# Patient Record
Sex: Male | Born: 1980 | Race: Black or African American | Hispanic: No | Marital: Single | Smoking: Former smoker
Health system: Southern US, Community
[De-identification: ages and names within clinical notes are randomized; demographics above are authoritative.]

## PROBLEM LIST (undated history)

## (undated) DIAGNOSIS — G4733 Obstructive sleep apnea (adult) (pediatric): Secondary | ICD-10-CM

## (undated) DIAGNOSIS — Z789 Other specified health status: Secondary | ICD-10-CM

## (undated) DIAGNOSIS — G709 Myoneural disorder, unspecified: Secondary | ICD-10-CM

## (undated) DIAGNOSIS — I5021 Acute systolic (congestive) heart failure: Secondary | ICD-10-CM

## (undated) DIAGNOSIS — I739 Peripheral vascular disease, unspecified: Secondary | ICD-10-CM

## (undated) DIAGNOSIS — Z9989 Dependence on other enabling machines and devices: Secondary | ICD-10-CM

## (undated) DIAGNOSIS — I4892 Unspecified atrial flutter: Secondary | ICD-10-CM

## (undated) DIAGNOSIS — F32A Depression, unspecified: Secondary | ICD-10-CM

## (undated) DIAGNOSIS — I4819 Other persistent atrial fibrillation: Secondary | ICD-10-CM

## (undated) DIAGNOSIS — B351 Tinea unguium: Secondary | ICD-10-CM

## (undated) DIAGNOSIS — K219 Gastro-esophageal reflux disease without esophagitis: Secondary | ICD-10-CM

## (undated) DIAGNOSIS — G4730 Sleep apnea, unspecified: Secondary | ICD-10-CM

## (undated) HISTORY — DX: Unspecified atrial flutter: I48.92

## (undated) HISTORY — DX: Other persistent atrial fibrillation: I48.19

## (undated) HISTORY — PX: CARDIAC CATHETERIZATION: SHX172

## (undated) HISTORY — DX: Obstructive sleep apnea (adult) (pediatric): G47.33

## (undated) HISTORY — DX: Tinea unguium: B35.1

## (undated) HISTORY — DX: Depression, unspecified: F32.A

## (undated) HISTORY — DX: Dependence on other enabling machines and devices: Z99.89

## (undated) HISTORY — PX: MANDIBLE SURGERY: SHX707

## (undated) HISTORY — DX: Gastro-esophageal reflux disease without esophagitis: K21.9

## (undated) HISTORY — DX: Sleep apnea, unspecified: G47.30

---

## 2000-05-06 ENCOUNTER — Emergency Department (HOSPITAL_COMMUNITY): Admission: EM | Admit: 2000-05-06 | Discharge: 2000-05-06 | Payer: Self-pay | Admitting: Emergency Medicine

## 2001-06-08 DIAGNOSIS — I4819 Other persistent atrial fibrillation: Secondary | ICD-10-CM

## 2001-06-08 HISTORY — DX: Other persistent atrial fibrillation: I48.19

## 2003-05-17 ENCOUNTER — Ambulatory Visit (HOSPITAL_BASED_OUTPATIENT_CLINIC_OR_DEPARTMENT_OTHER): Admission: RE | Admit: 2003-05-17 | Discharge: 2003-05-17 | Payer: Self-pay | Admitting: Pulmonary Disease

## 2003-07-05 ENCOUNTER — Ambulatory Visit (HOSPITAL_BASED_OUTPATIENT_CLINIC_OR_DEPARTMENT_OTHER): Admission: RE | Admit: 2003-07-05 | Discharge: 2003-07-05 | Payer: Self-pay | Admitting: Pulmonary Disease

## 2008-10-30 ENCOUNTER — Telehealth (INDEPENDENT_AMBULATORY_CARE_PROVIDER_SITE_OTHER): Payer: Self-pay | Admitting: *Deleted

## 2008-11-09 ENCOUNTER — Ambulatory Visit: Payer: Self-pay | Admitting: Internal Medicine

## 2008-11-09 DIAGNOSIS — G4730 Sleep apnea, unspecified: Secondary | ICD-10-CM | POA: Insufficient documentation

## 2008-11-09 DIAGNOSIS — I4891 Unspecified atrial fibrillation: Secondary | ICD-10-CM | POA: Insufficient documentation

## 2008-11-19 ENCOUNTER — Telehealth: Payer: Self-pay | Admitting: Internal Medicine

## 2008-11-20 ENCOUNTER — Encounter: Payer: Self-pay | Admitting: Internal Medicine

## 2009-08-27 ENCOUNTER — Telehealth: Payer: Self-pay | Admitting: Internal Medicine

## 2009-10-14 ENCOUNTER — Ambulatory Visit: Payer: Self-pay | Admitting: Internal Medicine

## 2009-10-14 DIAGNOSIS — L03221 Cellulitis of neck: Secondary | ICD-10-CM

## 2009-10-14 DIAGNOSIS — L0211 Cutaneous abscess of neck: Secondary | ICD-10-CM | POA: Insufficient documentation

## 2009-11-11 ENCOUNTER — Ambulatory Visit: Payer: Self-pay | Admitting: Internal Medicine

## 2009-11-11 DIAGNOSIS — M25579 Pain in unspecified ankle and joints of unspecified foot: Secondary | ICD-10-CM | POA: Insufficient documentation

## 2009-11-11 DIAGNOSIS — I872 Venous insufficiency (chronic) (peripheral): Secondary | ICD-10-CM | POA: Insufficient documentation

## 2009-11-11 LAB — CONVERTED CEMR LAB
ALT: 34 units/L (ref 0–53)
AST: 29 units/L (ref 0–37)
Albumin: 4.5 g/dL (ref 3.5–5.2)
Alkaline Phosphatase: 46 units/L (ref 39–117)
BUN: 17 mg/dL (ref 6–23)
Basophils Absolute: 0 10*3/uL (ref 0.0–0.1)
Basophils Relative: 0.3 % (ref 0.0–3.0)
Bilirubin Urine: NEGATIVE
Bilirubin, Direct: 0.1 mg/dL (ref 0.0–0.3)
CO2: 28 meq/L (ref 19–32)
Calcium: 9.4 mg/dL (ref 8.4–10.5)
Chloride: 104 meq/L (ref 96–112)
Cholesterol: 169 mg/dL (ref 0–200)
Creatinine, Ser: 1 mg/dL (ref 0.4–1.5)
Eosinophils Absolute: 0.1 10*3/uL (ref 0.0–0.7)
Eosinophils Relative: 1.8 % (ref 0.0–5.0)
GFR calc non Af Amer: 111.17 mL/min (ref >60)
Glucose, Bld: 85 mg/dL (ref 70–99)
HCT: 41.7 % (ref 39.0–52.0)
HDL: 54.4 mg/dL (ref >39.00)
Hemoglobin, Urine: NEGATIVE
Hemoglobin: 14.7 g/dL (ref 13.0–17.0)
Ketones, ur: NEGATIVE mg/dL
LDL Cholesterol: 99 mg/dL (ref 0–99)
Leukocytes, UA: NEGATIVE
Lymphocytes Relative: 24.8 % (ref 12.0–46.0)
Lymphs Abs: 1.6 10*3/uL (ref 0.7–4.0)
MCHC: 35.2 g/dL (ref 30.0–36.0)
MCV: 91.7 fL (ref 78.0–100.0)
Monocytes Absolute: 0.6 10*3/uL (ref 0.1–1.0)
Monocytes Relative: 8.9 % (ref 3.0–12.0)
Neutro Abs: 4.2 10*3/uL (ref 1.4–7.7)
Neutrophils Relative %: 64.2 % (ref 43.0–77.0)
Nitrite: NEGATIVE
Platelets: 205 10*3/uL (ref 150.0–400.0)
Potassium: 4.1 meq/L (ref 3.5–5.1)
RBC: 4.55 M/uL (ref 4.22–5.81)
RDW: 13.7 % (ref 11.5–14.6)
Sodium: 142 meq/L (ref 135–145)
Specific Gravity, Urine: >=1.03 (ref 1.000–1.030)
TSH: 1.34 microintl units/mL (ref 0.35–5.50)
Total Bilirubin: 0.8 mg/dL (ref 0.3–1.2)
Total CHOL/HDL Ratio: 3
Total Protein, Urine: NEGATIVE mg/dL
Total Protein: 7.7 g/dL (ref 6.0–8.3)
Triglycerides: 80 mg/dL (ref 0.0–149.0)
Urine Glucose: NEGATIVE mg/dL
Urobilinogen, UA: 0.2 (ref 0.0–1.0)
VLDL: 16 mg/dL (ref 0.0–40.0)
Vitamin B-12: 414 pg/mL (ref 211–911)
WBC: 6.6 10*3/uL (ref 4.5–10.5)
pH: 5 (ref 5.0–8.0)

## 2009-11-12 LAB — CONVERTED CEMR LAB

## 2010-04-11 ENCOUNTER — Encounter: Payer: Self-pay | Admitting: Internal Medicine

## 2010-04-11 ENCOUNTER — Ambulatory Visit: Payer: Self-pay | Admitting: Internal Medicine

## 2010-04-11 DIAGNOSIS — R05 Cough: Secondary | ICD-10-CM

## 2010-04-11 DIAGNOSIS — R091 Pleurisy: Secondary | ICD-10-CM | POA: Insufficient documentation

## 2010-04-11 DIAGNOSIS — R059 Cough, unspecified: Secondary | ICD-10-CM | POA: Insufficient documentation

## 2010-07-06 LAB — CONVERTED CEMR LAB
ALT: 38 units/L (ref 0–53)
AST: 29 units/L (ref 0–37)
Albumin: 4.3 g/dL (ref 3.5–5.2)
Alkaline Phosphatase: 55 units/L (ref 39–117)
BUN: 15 mg/dL (ref 6–23)
Basophils Absolute: 0.1 10*3/uL (ref 0.0–0.1)
Basophils Relative: 1 % (ref 0.0–3.0)
Bilirubin Urine: NEGATIVE
Bilirubin, Direct: 0.1 mg/dL (ref 0.0–0.3)
CO2: 27 meq/L (ref 19–32)
Calcium: 9.4 mg/dL (ref 8.4–10.5)
Chloride: 109 meq/L (ref 96–112)
Cholesterol: 157 mg/dL (ref 0–200)
Creatinine, Ser: 0.9 mg/dL (ref 0.4–1.5)
Eosinophils Absolute: 0.1 10*3/uL (ref 0.0–0.7)
Eosinophils Relative: 1.9 % (ref 0.0–5.0)
GFR calc non Af Amer: 127.86 mL/min (ref >60)
Glucose, Bld: 101 mg/dL — ABNORMAL HIGH (ref 70–99)
HCT: 45.4 % (ref 39.0–52.0)
HDL: 38.1 mg/dL — ABNORMAL LOW (ref >39.00)
Hemoglobin, Urine: NEGATIVE
Hemoglobin: 15.9 g/dL (ref 13.0–17.0)
Ketones, ur: NEGATIVE mg/dL
LDL Cholesterol: 104 mg/dL — ABNORMAL HIGH (ref 0–99)
Leukocytes, UA: NEGATIVE
Lymphocytes Relative: 26.7 % (ref 12.0–46.0)
Lymphs Abs: 1.8 10*3/uL (ref 0.7–4.0)
MCHC: 35.1 g/dL (ref 30.0–36.0)
MCV: 91 fL (ref 78.0–100.0)
Monocytes Absolute: 0.7 10*3/uL (ref 0.1–1.0)
Monocytes Relative: 10.5 % (ref 3.0–12.0)
Neutro Abs: 4 10*3/uL (ref 1.4–7.7)
Neutrophils Relative %: 59.9 % (ref 43.0–77.0)
Nitrite: NEGATIVE
Platelets: 184 10*3/uL (ref 150.0–400.0)
Potassium: 4.1 meq/L (ref 3.5–5.1)
RBC: 4.99 M/uL (ref 4.22–5.81)
RDW: 12.8 % (ref 11.5–14.6)
Sodium: 140 meq/L (ref 135–145)
Specific Gravity, Urine: 1.025 (ref 1.000–1.030)
TSH: 0.82 microintl units/mL (ref 0.35–5.50)
Total Bilirubin: 0.7 mg/dL (ref 0.3–1.2)
Total CHOL/HDL Ratio: 4
Total Protein, Urine: NEGATIVE mg/dL
Total Protein: 7.6 g/dL (ref 6.0–8.3)
Triglycerides: 74 mg/dL (ref 0.0–149.0)
Urine Glucose: NEGATIVE mg/dL
Urobilinogen, UA: 0.2 (ref 0.0–1.0)
VLDL: 14.8 mg/dL (ref 0.0–40.0)
WBC: 6.7 10*3/uL (ref 4.5–10.5)
pH: 5.5 (ref 5.0–8.0)

## 2010-07-10 NOTE — Assessment & Plan Note (Signed)
Summary: PHYSICAL---STC   Vital Signs:  Patient profile:   30 year old male Height:      74 inches Weight:      270.50 pounds BMI:     34.86 O2 Sat:      98 % on Room air Temp:     97.0 degrees F oral Pulse rate:   88 / minute BP sitting:   124 / 82  (left arm) Cuff size:   large  Vitals Entered By: Lucious Groves (November 11, 2009 3:17 PM)  O2 Flow:  Room air CC: CPX./kb Is Patient Diabetic? No Pain Assessment Patient in pain? no      Comments Patient deneis taking Vitamin D and Aspirin. Also, needs C PAP prescription re-printed./kb   CC:  CPX./kb.  History of Present Illness: The patient presents for a wellness examination  C/o R ankle  swelling C/o R big toenail discolored  Current Medications (verified): 1)  Vitamin D3 1000 Unit  Tabs (Cholecalciferol) .Marland Kitchen.. 1 By Mouth Daily 2)  Aspirin 81 Mg  Tbec (Aspirin) .... One By Mouth Every Day 3)  Cpap Mask .... 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    Physical  Exam  General:  Well-developed,well-nourished,in no acute distress; alert,appropriate and cooperative throughout examination,overweight-appearing.   Head:  Normocephalic and atraumatic without obvious abnormalities. No apparent alopecia or balding. Eyes:  No corneal or conjunctival inflammation noted. EOMI. Perrla. Ears:  External ear exam shows no significant lesions or deformities.  Otoscopic examination reveals clear canals, tympanic membranes are intact bilaterally without bulging, retraction, inflammation or discharge. Hearing is grossly normal bilaterally. Nose:  External nasal examination shows no deformity or inflammation. Nasal mucosa are pink and moist without lesions or exudates. Mouth:  Oral mucosa and oropharynx without lesions or exudates.  Teeth in good repair. Neck:  No deformities, masses, or tenderness noted. Chest Wall:  No deformities, masses, tenderness or gynecomastia noted. Breasts:  Mild  B gynecomastia.   Lungs:  Normal respiratory effort, chest expands symmetrically. Lungs are clear to auscultation, no crackles or wheezes. Heart:  Normal rate and regular rhythm. S1 and S2 normal without gallop, murmur, click, rub or other extra sounds. Abdomen:  Bowel sounds positive,abdomen soft and non-tender without masses, organomegaly or hernias noted. Genitalia:  Nl self exam Msk:  No deformity or scoliosis noted of thoracic or lumbar spine.   Pulses:  R and L carotid,radial,femoral,dorsalis pedis and posterior tibial pulses are full and equal bilaterally Extremities:  R ankle with slight swelling and a number of spider veins Neurologic:  No cranial nerve deficits noted. Station and gait are normal. Plantar reflexes are down-going bilaterally. DTRs are symmetrical throughout. Sensory, motor and coordinative functions appear intact. Skin:  hyperpigmented  R ankle R  great toe w/yellow discol.  Cervical Nodes:  No lymphadenopathy noted Psych:  Cognition and judgment appear intact.  Alert and cooperative with normal attention span and concentration. No apparent delusions, illusions, hallucinations   Impression & Recommendations:  Problem # 1:  PHYSICAL EXAMINATION (ICD-V70.0) Assessment New Health and age related issues were discussed. Available screening tests and vaccinations were discussed as well. Healthy life style including good diet and execise was discussed.  The labs were reviewed with the patient.  He needs to reduce his alcoho lintake Orders: TLB-B12, Serum-Total ONLY ([address]-U04) TLB-BMP (Basic Metabolic Panel-BMET) (80048-METABOL) TLB-CBC Platelet - w/Differential (85025-CBCD) TLB-Hepatic/Liver Function Pnl (80076-HEPATIC) TLB-Lipid Panel (80061-LIPID) TLB-TSH (Thyroid Stimulating Hormone) (84443-TSH) TLB-Udip ONLY (81003-UDIP) T-HIV Antibody  (Reflex) (540[address]-11914)  Problem # 2:  ANKLE PAIN (ICD-719.47) R - ven insuff Assessment: Deteriorated Compr socks Vein clinic cons. suggested Orders: TLB-BMP (Basic Metabolic Panel-BMET) (80048-METABOL) TLB-CBC Platelet - w/Differential (85025-CBCD) TLB-Hepatic/Liver Function Pnl (80076-HEPATIC) TLB-Lipid Panel (80061-LIPID) TLB-TSH (Thyroid Stimulating Hormone) (84443-TSH) TLB-Udip ONLY (81003-UDIP)  Problem # 3:  ATRIAL FIBRILLATION (ICD-427.31) Assessment: Unchanged  His updated medication list for this problem includes:    Aspirin 81 Mg Tbec (Aspirin) ..... One by mouth every day  Problem # 4:  ABSCESS, NECK (ICD-682.1) Assessment: Improved  Problem # 5:  Discolored oenail R big toe Penlac did not help - Derm cons  Complete Medication List: 1)  Vitamin D3 1000 Unit Tabs (Cholecalciferol) .Marland Kitchen.. 1 by mouth daily 2)  Aspirin 81 Mg Tbec (Aspirin) .... One by mouth every day 3)  Cpap Mask  .... As dirrected  Patient Instructions: 1)  Please schedule a follow-up appointment in 6 months. 2)  Compression socks 3)  Loose weight! 4)  Try to eat more raw plant food, fresh and dry fruit, raw almonds,  leafy vegetables, whole foods and less red meat, less animal fat. Poultry and fish is better for you than pork and beef. Avoid processed foods (canned soups, hot dogs, sausage, bacon , frozen dinners). Avoid corn syrup, high fructose syrup.  Honey, Agave and Stevia are better sweeteners. Make your own  dressing with olive oil, wine vinegar, lemon juce, garlic etc. for your salads. Prescriptions: CPAP MASK as dirrected  #1 x 0   Entered and Authorized by:   Tresa Garter MD   Signed by:   Tresa Garter MD on 11/11/2009   Method used:   Print then Give to Patient   RxID:   [id number]

## 2010-07-10 NOTE — Letter (Signed)
Summary: Out of Work  [facility]  [address] [place], [place] [address]   Phone: [phone]  Fax: [phone]    Oct 14, 2009   Employee:  Lance [name]    To Whom It May Concern:   For Medical reasons, please excuse the above named employee from work for the following dates:  Start:   10/14/09  End: 10/16/09    If you need additional information, please feel free to contact our office.         Sincerely,    Tresa Garter MD

## 2010-07-10 NOTE — Progress Notes (Signed)
Summary: Mask  Phone Note Call from Patient Call back at Home Phone [phone]   Summary of Call: Pt had sleep study in 2003 by Dr Shelle Iron and was prescribed a "sleep machine". He is req a replacement rx for mask.  Initial call taken by: Lamar Sprinkles, CMA,  August 27, 2009 11:42 AM  Follow-up for Phone Call        ok Follow-up by: Tresa Garter MD,  August 27, 2009 6:02 PM  Additional Follow-up for Phone Call Additional follow up Details #1::        Patient notified and will have the company contact us  with how to take care of the prescription. Additional Follow-up by: Lucious Groves,  August 28, 2009 9:38 AM    New/Updated Medications: * CPAP MASK as dirrected Prescriptions: CPAP MASK as dirrected  #1 x 0   Entered and Authorized by:   Tresa Garter MD   Signed by:   Lucious Groves on 08/28/2009   Method used:   Print then Give to Patient   RxID:   [id number]

## 2010-07-10 NOTE — Assessment & Plan Note (Signed)
Summary: chest pain when coughing/plotnikov-lb   Vital Signs:  Patient profile:   30 year old male Height:      74 inches (187.96 cm) Weight:      270 pounds (122.73 kg) O2 Sat:      96 % on Room air Temp:     97.2 degrees F (36.22 degrees C) oral Pulse rate:   128 / minute BP sitting:   122 / 72  (left arm) Cuff size:   large  Vitals Entered By: Orlan Leavens RMA (April 11, 2010 1:39 PM)  O2 Flow:  Room air CC: Cough Is Patient Diabetic? No Pain Assessment Patient in pain? yes     Location: chest Type: aching Onset of pain  Pt states been having cough with little phlegm, been haviong some SOB & chest pain x's 3 days   Primary Care Provider:  Tresa Garter MD  CC:  Cough.  History of Present Illness: c/o dry cough onset 3 days ago feels tight in chest and painful resp with every cough spell - today a/w DOE and SOB, no leg swelling no sputum, no fever - +racing heart "but different than my A fib" +sick contacts no n/v/d no dizzy or syncope symptoms not improved with OTC meds (has not tried NSAIDs and denies decongestant use)  Current Medications (verified): 1)  Vitamin D3 1000 Unit  Tabs (Cholecalciferol) .Marland Kitchen.. 1 By Mouth Daily 2)  Aspirin 81 Mg  Tbec (Aspirin) .... One By Mouth Every Day 3)  Cpap Mask .... 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    Physical Exam  General:  alert, well-developed, well-nourished, and cooperative to examination.   nontoxic Lungs:  Normal respiratory effort, chest expands  symmetrically. Lungs are clear to auscultation, no crackles or wheezes. Heart:  tachy, irreg irreg, no rub and no murmur appreciated   Impression & Recommendations:  Problem # 1:  PLEURISY (ICD-511.0) suspect viral syndrome causing dry cough and painful resp symptoms  check cxr now r/o other abn - hold emperic abx but tx NSAIDs and cough suppress with hydromet flare of Afib, currently RVR but long hx same - likely exac but acute infx syndrome - no ischemic change on EKG Orders: T-2 View CXR (71020TC) Prescription Created Electronically [phone])  Problem # 2:  COUGH (ICD-786.2) see pleurisy discussion above re: med tx same Orders: T-2 View CXR (71020TC)  Problem # 3:  ATRIAL FIBRILLATION (ICD-427.31) HD stable flare of Afib, currently RVR but long hx same - likely exac but acute infx syndrome - no ischemic change on EKG today rec cards followup, esp if unresolved with tx of acute infx/cough His updated medication list for this problem includes:    Aspirin 81 Mg Tbec (Aspirin) ..... One by mouth every day  Orders: EKG w/ Interpretation (93000)  Complete Medication List: 1)  Vitamin D3 1000 Unit Tabs (Cholecalciferol) .Marland Kitchen.. 1 by mouth daily 2)  Aspirin 81 Mg Tbec (Aspirin) .... One by mouth every day 3)  Cpap Mask  .... As dirrected 4)  Indomethacin 50 Mg Caps (Indomethacin) .Marland Kitchen.. 1 by mouth  three times a day x 5 days 5)  Hydromet 5-1.5 Mg/44ml Syrp (Hydrocodone-homatropine) .... 5 cc by mouth every 4-6 hours as needed for cough symptoms  Patient Instructions: 1)  it was good to see you today. 2)  hydromet syrup as needed for cough and indomethacin three times a day x 5days for pain and inflammation - your prescriptions have been electronically submitted to your pharmacy. Please take as directed. Contact our office if you believe you're having problems with the medication(s).  3)  xray ordered today - your results will be called to you after review in 48-72 hours from the time of  test completion 4)  also get plenty of rest, drink lots of clear liquids, and use Tylenol if needed for fever and comfort. Return in 7-10 days if you're not better:sooner if you're feeling worse. 5)  schedule followup with Dr. [name] if continued heart racing symptoms after resolution of your cough and cold symptoms  Prescriptions: HYDROMET 5-1.5 MG/5ML SYRP (HYDROCODONE-HOMATROPINE) 5 cc by mouth every 4-6 hours as needed for cough symptoms  #6 oz x 0   Entered and Authorized by:   Newt Lukes MD   Signed by:   Newt Lukes MD on 04/11/2010   Method used:   Printed then faxed to ...       [facility]       [address]       [place], [place]  [address]       Ph: [phone]       Fax: [phone]   RxID:   [id number] INDOMETHACIN 50 MG CAPS (INDOMETHACIN) 1 by mouth three times a day x 5 days  #5 x 0   Entered and Authorized by:   Newt Lukes MD   Signed by:   Newt Lukes MD on 04/11/2010   Method used:   Electronically to        [facility])       [address]       [place], [place]  [address]       Ph: [phone]       Fax: [phone]   RxID:   [id number]    Orders Added: 1)  EKG w/ Interpretation [93000] 2)  T-2 View CXR [71020TC] 3)  Est. Patient Level IV [88416] 4)  Prescription Created Electronically [phone]

## 2010-07-10 NOTE — Assessment & Plan Note (Signed)
Summary: swollen knot on neck/cd   Vital Signs:  Patient profile:   30 year old male Height:      74 inches Weight:      274.50 pounds BMI:     35.37 O2 Sat:      97 % on Room air Temp:     98.0 degrees F oral Pulse rate:   81 / minute BP sitting:   114 / 78  (left arm) Cuff size:   large  Vitals Entered By: Lucious Groves (Oct 14, 2009 11:20 AM)  O2 Flow:  Room air  Procedure Note  Incision & Drainage: The patient complains of pain, redness, and inflammation. Indication: infected lesion Consent signed: yes  Procedure # 1: I & D with packing    Size (in cm): 2.5 x 2.0    Region: posterior    Location: neck-anterior    Comment: Risks including but not limited by incomplete procedure, bleeding, infection, recurrence were discussed with the patient. Consent form was signed.     Instrument used: #10 blade    Anesthesia: 1% lidocaine w/epinephrine  Cleaned and prepped with: alcohol and betadine Wound dressing: bulky gauze dressing Instructions: daily dressing changes Additional Instructions: Silvadine cream; 4" packing  CC: C/o swollen knot on back of neck at hairline./kb Is Patient Diabetic? No Pain Assessment Patient in pain? yes     Location: back of neck Intensity: 6 Onset of pain  1 week ago Comments Patient notes that there has been a bump there, but has become a knot in the past week. Patient denies haircut/shave within appropriate time frame./kb   CC:  C/o swollen knot on back of neck at hairline./kb.  History of Present Illness: C/o painful lump on the back of his neck x 3-4 d: much worse over past 24 hrs. He used to have a small bean size subcutaneouse lump there x years  Current Medications (verified): 1)  Vitamin D3 1000 Unit  Tabs (Cholecalciferol) .Marland Kitchen.. 1 By Mouth Daily 2)  Penlac 8 % Soln (Ciclopirox) .... Qd 3)  Aspirin 81 Mg  Tbec (Aspirin) .... One By Mouth Every Day 4)  Cpap Mask .... 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   Head:  Normocephalic and atraumatic without obvious abnormalities. No apparent alopecia or balding. Neck:  Tender posteriorly Lungs:  Normal respiratory effort, chest expands symmetrically. Lungs are clear to auscultation, no crackles or wheezes. Heart:  Normal rate and regular rhythm. S1 and S2 normal without gallop, murmur, click, rub or other extra sounds. Skin:  2.5x2.0 cm erythematouse tender abscess in the mid-posterior neck   Impression & Recommendations:  Problem # 1:  ABSCESS, NECK (ICD-682.1) Assessment New  Options discussed - elected I&D His updated medication list for this problem includes:    Doxycycline Hyclate 100 Mg Caps (Doxycycline hyclate) .Marland Kitchen... 1 by mouth two times a day with a glass of water Mupirocin oint  Orders: I&D Abscess, Complex (10061)  Complete Medication List: 1)  Vitamin D3 1000 Unit Tabs (Cholecalciferol) .Marland Kitchen.. 1 by mouth daily 2)  Penlac 8 % Soln (Ciclopirox) .... Qd 3)  Aspirin 81 Mg Tbec (Aspirin) .... One by mouth every day  4)  Cpap Mask  .... As dirrected 5)  Mupirocin 2 % Oint (Mupirocin) .... Use two times a day w/dressing change 6)  Doxycycline Hyclate 100 Mg Caps (Doxycycline hyclate) .Marland Kitchen.. 1 by mouth two times a day with a glass of water 7)  Hydrocodone-acetaminophen 5-325 Mg Tabs (Hydrocodone-acetaminophen) .Marland Kitchen.. 1-2 by mouth two times a day as needed pain  Patient Instructions: 1)  Pull out one inch of the drain/day and cut off 2)  Change dressing daily 3)  Call if you are not better in a reasonable  amount of time or if worse. Go to ER if feeling really bad! Prescriptions: HYDROCODONE-ACETAMINOPHEN 5-325 MG TABS (HYDROCODONE-ACETAMINOPHEN) 1-2 by mouth two times a day as needed pain  #20 x 0   Entered and Authorized by:   Tresa Garter MD   Signed by:   Tresa Garter MD on 10/14/2009   Method used:   Print then Give to Patient   RxID:   [id number] DOXYCYCLINE HYCLATE 100 MG CAPS (DOXYCYCLINE HYCLATE) 1 by mouth two times a day with a glass of water  #28 x 1   Entered and Authorized by:   Tresa Garter MD   Signed by:   Tresa Garter MD on 10/14/2009   Method used:   Print then Give to Patient   RxID:   [id number] MUPIROCIN 2 % OINT (MUPIROCIN) use two times a day w/dressing change  #30 g x 0   Entered and Authorized by:   Tresa Garter MD   Signed by:   Tresa Garter MD on 10/14/2009   Method used:   Print then Give to Patient   RxID:   [id number]

## 2010-11-07 ENCOUNTER — Other Ambulatory Visit: Payer: Self-pay | Admitting: Internal Medicine

## 2010-11-07 ENCOUNTER — Other Ambulatory Visit (INDEPENDENT_AMBULATORY_CARE_PROVIDER_SITE_OTHER): Payer: Self-pay

## 2010-11-07 DIAGNOSIS — Z0000 Encounter for general adult medical examination without abnormal findings: Secondary | ICD-10-CM

## 2010-11-07 LAB — HEPATIC FUNCTION PANEL
ALT: 35 U/L (ref 0–53)
Total Bilirubin: 0.8 mg/dL (ref 0.3–1.2)
Total Protein: 7.1 g/dL (ref 6.0–8.3)

## 2010-11-07 LAB — LIPID PANEL
Cholesterol: 128 mg/dL (ref 0–200)
LDL Cholesterol: 82 mg/dL (ref 0–99)
Total CHOL/HDL Ratio: 3
VLDL: 9 mg/dL (ref 0.0–40.0)

## 2010-11-07 LAB — CBC WITH DIFFERENTIAL/PLATELET
Eosinophils Relative: 2.3 % (ref 0.0–5.0)
Monocytes Relative: 10.2 % (ref 3.0–12.0)
Neutrophils Relative %: 58.5 % (ref 43.0–77.0)
Platelets: 218 10*3/uL (ref 150.0–400.0)
WBC: 5.7 10*3/uL (ref 4.5–10.5)

## 2010-11-07 LAB — BASIC METABOLIC PANEL
BUN: 14 mg/dL (ref 6–23)
CO2: 25 mEq/L (ref 19–32)
Chloride: 106 mEq/L (ref 96–112)
Potassium: 4.6 mEq/L (ref 3.5–5.1)

## 2010-11-07 LAB — URINALYSIS
Hgb urine dipstick: NEGATIVE
Ketones, ur: NEGATIVE
Urine Glucose: NEGATIVE
Urobilinogen, UA: 0.2 (ref 0.0–1.0)

## 2010-11-07 LAB — TSH: TSH: 0.84 u[IU]/mL (ref 0.35–5.50)

## 2010-11-14 ENCOUNTER — Encounter: Payer: Self-pay | Admitting: Internal Medicine

## 2010-11-14 ENCOUNTER — Ambulatory Visit (INDEPENDENT_AMBULATORY_CARE_PROVIDER_SITE_OTHER): Payer: 59 | Admitting: Internal Medicine

## 2010-11-14 VITALS — BP 118/70 | HR 72 | Temp 97.7°F | Resp 16 | Ht 74.0 in | Wt 259.0 lb

## 2010-11-14 DIAGNOSIS — R21 Rash and other nonspecific skin eruption: Secondary | ICD-10-CM

## 2010-11-14 DIAGNOSIS — R05 Cough: Secondary | ICD-10-CM

## 2010-11-14 DIAGNOSIS — R059 Cough, unspecified: Secondary | ICD-10-CM

## 2010-11-14 DIAGNOSIS — I4891 Unspecified atrial fibrillation: Secondary | ICD-10-CM

## 2010-11-14 DIAGNOSIS — Z0000 Encounter for general adult medical examination without abnormal findings: Secondary | ICD-10-CM | POA: Insufficient documentation

## 2010-11-14 DIAGNOSIS — Z23 Encounter for immunization: Secondary | ICD-10-CM

## 2010-11-14 MED ORDER — KETOCONAZOLE 2 % EX CREA: TOPICAL_CREAM | Freq: Every day | CUTANEOUS | Status: AC

## 2010-11-14 MED ORDER — CLOBETASOL PROPIONATE 0.05 % EX OINT: TOPICAL_OINTMENT | Freq: Two times a day (BID) | CUTANEOUS | Status: AC

## 2010-11-14 MED ORDER — TETANUS-DIPHTH-ACELL PERTUSSIS 5-2.5-18.5 LF-MCG/0.5 IM SUSP
0.5000 mL | Freq: Once | INTRAMUSCULAR | Status: AC
  Administered 2010-11-14: 0.5 mL via INTRAMUSCULAR

## 2010-11-14 NOTE — Assessment & Plan Note (Signed)
Better  

## 2010-11-14 NOTE — Assessment & Plan Note (Signed)
Eczema poss with a fungal overgrowth. See Meds Dr Jorja Loa if not better
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
## 2010-11-14 NOTE — Assessment & Plan Note (Signed)
We discussed age appropriate health related issues, including available/recomended screening tests and vaccinations. We discussed a need for adhering to healthy diet and exercise. Labs/EKG were reviewed/ordered. All questions were answered.   

## 2010-11-19 ENCOUNTER — Ambulatory Visit (INDEPENDENT_AMBULATORY_CARE_PROVIDER_SITE_OTHER)
Admission: RE | Admit: 2010-11-19 | Discharge: 2010-11-19 | Disposition: A | Discharge status: Home / Self Care | Payer: 59 | Source: Ambulatory Visit | Attending: Internal Medicine | Admitting: Internal Medicine

## 2010-11-19 DIAGNOSIS — R05 Cough: Secondary | ICD-10-CM

## 2010-11-19 DIAGNOSIS — Z0000 Encounter for general adult medical examination without abnormal findings: Secondary | ICD-10-CM

## 2010-11-19 DIAGNOSIS — R059 Cough, unspecified: Secondary | ICD-10-CM

## 2010-11-24 ENCOUNTER — Telehealth: Payer: Self-pay | Admitting: *Deleted

## 2010-11-24 MED ORDER — MOMETASONE FURO-FORMOTEROL FUM 100-5 MCG/ACT IN AERO: 2.0000 | INHALATION_SPRAY | RESPIRATORY_TRACT | Status: DC

## 2010-11-24 NOTE — Telephone Encounter (Signed)
Mild bronchitis on CXR - poss asthmatic. Pick up Dulera sample and card - use qd x 1 month - see if cough resolved Thx

## 2010-11-24 NOTE — Telephone Encounter (Signed)
Pt calling to get results of chest xray please Advise

## 2010-11-25 NOTE — Telephone Encounter (Signed)
Pt informed & sample ready  For P/U

## 2010-12-08 ENCOUNTER — Ambulatory Visit (INDEPENDENT_AMBULATORY_CARE_PROVIDER_SITE_OTHER)
Admission: RE | Admit: 2010-12-08 | Discharge: 2010-12-08 | Disposition: A | Discharge status: Home / Self Care | Payer: 59 | Source: Ambulatory Visit | Attending: Internal Medicine | Admitting: Internal Medicine

## 2010-12-08 ENCOUNTER — Encounter: Payer: Self-pay | Admitting: Internal Medicine

## 2010-12-08 ENCOUNTER — Ambulatory Visit (INDEPENDENT_AMBULATORY_CARE_PROVIDER_SITE_OTHER): Payer: 59 | Admitting: Internal Medicine

## 2010-12-08 VITALS — BP 122/82 | HR 74 | Temp 98.0°F | Ht 74.0 in | Wt 256.0 lb

## 2010-12-08 DIAGNOSIS — M25522 Pain in left elbow: Secondary | ICD-10-CM

## 2010-12-08 DIAGNOSIS — M25529 Pain in unspecified elbow: Secondary | ICD-10-CM

## 2010-12-08 MED ORDER — DICLOFENAC SODIUM 75 MG PO TBEC: 75.0000 mg | DELAYED_RELEASE_TABLET | Freq: Two times a day (BID) | ORAL | Status: AC
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
## 2010-12-08 NOTE — Patient Instructions (Signed)
It was good to see you today. You have a nondisplaced, small fracture of radial head (elbow injury) - this does not require surgery Ice to elbow 20 minutes 4x/day for 1st 48 hours, then as needed Use Voltaren 2x/day with food for inflammation and pain - Your prescription(s) have been submitted to your pharmacy. Please take as directed and contact our office if you believe you are having problem(s) with the medication(s). Return for recheck xray in 2-3 weeks to recheck healing, call sooner for recheck office visit if worse Move elbow as much as possible as pin will allow - turn hand over as discussed several times daily and flex+extend at elbow as much as you can toelrate - pain with remain with extension for up to 6 weeks but should slowly improve with time

## 2012-08-13 ENCOUNTER — Emergency Department (HOSPITAL_COMMUNITY): Payer: Self-pay

## 2012-08-13 ENCOUNTER — Inpatient Hospital Stay (HOSPITAL_COMMUNITY)
Admission: EM | Admit: 2012-08-13 | Discharge: 2012-08-22 | DRG: 308 | Disposition: A | Discharge status: Home / Self Care | Payer: 59 | Attending: Internal Medicine | Admitting: Internal Medicine

## 2012-08-13 ENCOUNTER — Encounter (HOSPITAL_COMMUNITY): Payer: Self-pay

## 2012-08-13 DIAGNOSIS — G4730 Sleep apnea, unspecified: Secondary | ICD-10-CM

## 2012-08-13 DIAGNOSIS — I959 Hypotension, unspecified: Secondary | ICD-10-CM

## 2012-08-13 DIAGNOSIS — I5021 Acute systolic (congestive) heart failure: Secondary | ICD-10-CM | POA: Diagnosis present

## 2012-08-13 DIAGNOSIS — I4891 Unspecified atrial fibrillation: Principal | ICD-10-CM

## 2012-08-13 DIAGNOSIS — G4733 Obstructive sleep apnea (adult) (pediatric): Secondary | ICD-10-CM

## 2012-08-13 DIAGNOSIS — R570 Cardiogenic shock: Secondary | ICD-10-CM | POA: Diagnosis present

## 2012-08-13 DIAGNOSIS — R091 Pleurisy: Secondary | ICD-10-CM

## 2012-08-13 DIAGNOSIS — I428 Other cardiomyopathies: Secondary | ICD-10-CM | POA: Diagnosis present

## 2012-08-13 DIAGNOSIS — T462X5A Adverse effect of other antidysrhythmic drugs, initial encounter: Secondary | ICD-10-CM | POA: Diagnosis present

## 2012-08-13 DIAGNOSIS — R0602 Shortness of breath: Secondary | ICD-10-CM

## 2012-08-13 DIAGNOSIS — J9600 Acute respiratory failure, unspecified whether with hypoxia or hypercapnia: Secondary | ICD-10-CM | POA: Diagnosis not present

## 2012-08-13 DIAGNOSIS — Z9989 Dependence on other enabling machines and devices: Secondary | ICD-10-CM

## 2012-08-13 DIAGNOSIS — I10 Essential (primary) hypertension: Secondary | ICD-10-CM | POA: Diagnosis present

## 2012-08-13 DIAGNOSIS — R579 Shock, unspecified: Secondary | ICD-10-CM

## 2012-08-13 DIAGNOSIS — R7989 Other specified abnormal findings of blood chemistry: Secondary | ICD-10-CM

## 2012-08-13 DIAGNOSIS — I4892 Unspecified atrial flutter: Secondary | ICD-10-CM | POA: Diagnosis not present

## 2012-08-13 DIAGNOSIS — I059 Rheumatic mitral valve disease, unspecified: Secondary | ICD-10-CM | POA: Diagnosis present

## 2012-08-13 DIAGNOSIS — R05 Cough: Secondary | ICD-10-CM

## 2012-08-13 DIAGNOSIS — R059 Cough, unspecified: Secondary | ICD-10-CM

## 2012-08-13 LAB — CBC WITH DIFFERENTIAL/PLATELET
Basophils Absolute: 0 10*3/uL (ref 0.0–0.1)
Basophils Relative: 0 % (ref 0–1)
Eosinophils Absolute: 0.1 10*3/uL (ref 0.0–0.7)
Eosinophils Relative: 1 % (ref 0–5)
HCT: 40.1 % (ref 39.0–52.0)
Hemoglobin: 14 g/dL (ref 13.0–17.0)
MCH: 32 pg (ref 26.0–34.0)
MCHC: 34.9 g/dL (ref 30.0–36.0)
MCV: 91.6 fL (ref 78.0–100.0)
Monocytes Absolute: 0.5 10*3/uL (ref 0.1–1.0)
Monocytes Relative: 7 % (ref 3–12)
RDW: 14.3 % (ref 11.5–15.5)

## 2012-08-13 LAB — POCT I-STAT, CHEM 8
Calcium, Ion: 1.24 mmol/L — ABNORMAL HIGH (ref 1.12–1.23)
Chloride: 105 mEq/L (ref 96–112)
HCT: 41 % (ref 39.0–52.0)
Hemoglobin: 13.9 g/dL (ref 13.0–17.0)

## 2012-08-13 LAB — BASIC METABOLIC PANEL
Calcium: 9.1 mg/dL (ref 8.4–10.5)
GFR calc Af Amer: >90 mL/min (ref >90)
GFR calc non Af Amer: 81 mL/min — ABNORMAL LOW (ref >90)
Sodium: 137 mEq/L (ref 135–145)

## 2012-08-13 LAB — PHOSPHORUS: Phosphorus: 3.1 mg/dL (ref 2.3–4.6)

## 2012-08-13 LAB — POCT I-STAT TROPONIN I: Troponin i, poc: 0.03 ng/mL (ref 0.00–0.08)

## 2012-08-13 LAB — MAGNESIUM: Magnesium: 1.8 mg/dL (ref 1.5–2.5)

## 2012-08-13 LAB — PRO B NATRIURETIC PEPTIDE: Pro B Natriuretic peptide (BNP): 2394 pg/mL — ABNORMAL HIGH (ref 0–125)

## 2012-08-13 MED ORDER — LIDOCAINE HCL (PF) 1 % IJ SOLN
INTRAMUSCULAR | Status: AC
  Filled 2012-08-13: qty 5

## 2012-08-13 MED ORDER — SODIUM CHLORIDE 0.9 % IJ SOLN: 3.0000 mL | INTRAMUSCULAR | Status: DC | PRN

## 2012-08-13 MED ORDER — FUROSEMIDE 10 MG/ML IJ SOLN
80.0000 mg | Freq: Two times a day (BID) | INTRAMUSCULAR | Status: DC
  Administered 2012-08-13 – 2012-08-14 (×2): 80 mg via INTRAVENOUS
  Filled 2012-08-13 (×4): qty 8

## 2012-08-13 MED ORDER — HEPARIN (PORCINE) IN NACL 100-0.45 UNIT/ML-% IJ SOLN
1900.0000 [IU]/h | INTRAMUSCULAR | Status: DC
  Administered 2012-08-13 – 2012-08-17 (×7): 1500 [IU]/h via INTRAVENOUS
  Administered 2012-08-18 – 2012-08-19 (×3): 1900 [IU]/h via INTRAVENOUS
  Filled 2012-08-13 (×13): qty 250

## 2012-08-13 MED ORDER — SODIUM CHLORIDE 0.9 % IV SOLN: 250.0000 mL | INTRAVENOUS | Status: DC | PRN

## 2012-08-13 MED ORDER — RIVAROXABAN 20 MG PO TABS
20.0000 mg | ORAL_TABLET | Freq: Every day | ORAL | Status: DC
  Filled 2012-08-13: qty 1

## 2012-08-13 MED ORDER — AMIODARONE IV BOLUS ONLY 150 MG/100ML: 150.0000 mg | Freq: Once | INTRAVENOUS | Status: DC

## 2012-08-13 MED ORDER — SODIUM CHLORIDE 0.9 % IJ SOLN
3.0000 mL | Freq: Two times a day (BID) | INTRAMUSCULAR | Status: DC
  Administered 2012-08-13 – 2012-08-16 (×2): 3 mL via INTRAVENOUS

## 2012-08-13 MED ORDER — NOREPINEPHRINE BITARTRATE 1 MG/ML IJ SOLN
2.0000 ug/min | INTRAVENOUS | Status: DC
  Administered 2012-08-13: 8 ug/min via INTRAVENOUS
  Filled 2012-08-13: qty 4

## 2012-08-13 MED ORDER — AMIODARONE HCL IN DEXTROSE 360-4.14 MG/200ML-% IV SOLN: 0.5000 mg/min | INTRAVENOUS | Status: DC

## 2012-08-13 MED ORDER — DILTIAZEM HCL 100 MG IV SOLR
10.0000 mg/h | INTRAVENOUS | Status: DC
  Administered 2012-08-13: 10 mg/h via INTRAVENOUS

## 2012-08-13 MED ORDER — DIGOXIN 250 MCG PO TABS
0.2500 mg | ORAL_TABLET | Freq: Every day | ORAL | Status: DC
  Administered 2012-08-14 – 2012-08-22 (×9): 0.25 mg via ORAL
  Filled 2012-08-13 (×10): qty 1

## 2012-08-13 MED ORDER — FENTANYL CITRATE 0.05 MG/ML IJ SOLN
INTRAMUSCULAR | Status: AC
  Filled 2012-08-13: qty 4

## 2012-08-13 MED ORDER — SODIUM CHLORIDE 0.9 % IV SOLN
Freq: Once | INTRAVENOUS | Status: AC
  Administered 2012-08-13: 11:00:00 via INTRAVENOUS

## 2012-08-13 MED ORDER — AMIODARONE HCL IN DEXTROSE 360-4.14 MG/200ML-% IV SOLN
1.0000 mg/min | INTRAVENOUS | Status: DC
  Administered 2012-08-13: 1 mg/min via INTRAVENOUS
  Filled 2012-08-13: qty 200

## 2012-08-13 MED ORDER — LIDOCAINE IN D5W 4-5 MG/ML-% IV SOLN
1.0000 mg/min | INTRAVENOUS | Status: DC
  Filled 2012-08-13: qty 250

## 2012-08-13 MED ORDER — ACETAMINOPHEN 325 MG PO TABS
650.0000 mg | ORAL_TABLET | ORAL | Status: DC | PRN
  Administered 2012-08-15 – 2012-08-20 (×2): 650 mg via ORAL
  Filled 2012-08-13 (×2): qty 2

## 2012-08-13 MED ORDER — ONDANSETRON HCL 4 MG/2ML IJ SOLN
4.0000 mg | Freq: Four times a day (QID) | INTRAMUSCULAR | Status: DC | PRN
  Filled 2012-08-13: qty 2

## 2012-08-13 MED ORDER — DIGOXIN 0.25 MG/ML IJ SOLN
0.5000 mg | Freq: Once | INTRAMUSCULAR | Status: AC
  Administered 2012-08-13: 0.5 mg via INTRAVENOUS
  Filled 2012-08-13: qty 2

## 2012-08-13 MED ORDER — AMIODARONE LOAD VIA INFUSION
150.0000 mg | Freq: Once | INTRAVENOUS | Status: DC
  Filled 2012-08-13: qty 83.34

## 2012-08-13 MED ORDER — HEPARIN BOLUS VIA INFUSION
5000.0000 [IU] | Freq: Once | INTRAVENOUS | Status: AC
  Administered 2012-08-13: 5000 [IU] via INTRAVENOUS
  Filled 2012-08-13: qty 5000

## 2012-08-13 MED ORDER — PANTOPRAZOLE SODIUM 40 MG IV SOLR
40.0000 mg | INTRAVENOUS | Status: DC
  Administered 2012-08-13 – 2012-08-14 (×2): 40 mg via INTRAVENOUS
  Filled 2012-08-13 (×3): qty 40

## 2012-08-13 MED ORDER — POTASSIUM CHLORIDE CRYS ER 20 MEQ PO TBCR
20.0000 meq | EXTENDED_RELEASE_TABLET | Freq: Two times a day (BID) | ORAL | Status: DC
  Administered 2012-08-13 – 2012-08-22 (×18): 20 meq via ORAL
  Filled 2012-08-13 (×21): qty 1

## 2012-08-13 MED ORDER — DILTIAZEM HCL 25 MG/5ML IV SOLN
10.0000 mg | Freq: Once | INTRAVENOUS | Status: AC
  Administered 2012-08-13: 10 mg via INTRAVENOUS
  Filled 2012-08-13: qty 5

## 2012-08-13 MED ORDER — MIDAZOLAM HCL 2 MG/2ML IJ SOLN
INTRAMUSCULAR | Status: AC
  Filled 2012-08-13: qty 4

## 2012-08-13 NOTE — Progress Notes (Signed)
  Good response to IV lasix. About 1L out so far.   HR remains elevated 140-150 range despite amio drip. Will rebolus. Start digoxin as well. Suspect rate control will be difficult.   Young Lavette Yankovich,MD 4:24 PM

## 2012-08-13 NOTE — ED Provider Notes (Signed)
History     CSN: [id number]  Arrival date & time 08/13/12  0865   First MD Initiated Contact with Patient 08/13/12 0830      Chief Complaint  Patient presents with  . Shortness of Breath    (Consider location/radiation/quality/duration/timing/severity/associated sxs/prior treatment) HPI  Past Medical History  Diagnosis Date  . History of atrial fibrillation 2003    Dr. [name]  . OSA on CPAP   . Onychomycosis     Past Surgical History  Procedure Laterality Date  . Mandible surgery      underbite correction    Family History  Problem Relation Age of Onset  . Diabetes Mother   . Hypertension Mother   . Depression Father   . Osteoarthritis Father   . Alcohol abuse Father     History  Substance Use Topics  . Smoking status: Former Games developer  . Smokeless tobacco: Not on file  . Alcohol Use: No      Review of Systems  Allergies  Metoprolol succinate  Home Medications   Current Outpatient Rx  Name  Route  Sig  Dispense  Refill  . HYDROcodone-acetaminophen (NORCO/VICODIN) 5-325 MG per tablet   Oral   Take 1-2 tablets by mouth at bedtime as needed (for cough).          Marland Kitchen sulfamethoxazole-trimethoprim (BACTRIM DS) 800-160 MG per tablet   Oral   Take 1 tablet by mouth 2 (two) times daily.           BP 124/61  Pulse 91  Temp(Src) 98.2 F (36.8 C) (Oral)  Resp 26  SpO2 99%  Physical Exam  ED Course  Procedures (including critical care time)  Labs Reviewed  PRO B NATRIURETIC PEPTIDE - Abnormal; Notable for the following:    Pro B Natriuretic peptide (BNP) 2394.0 (*)    All other components within normal limits  POCT I-STAT, CHEM 8 - Abnormal; Notable for the following:    Glucose, Bld 107 (*)    Calcium, Ion 1.24 (*)    All other components within normal limits  CBC WITH DIFFERENTIAL   Dg Chest 2 View (if Patient Has Fever And/or Copd)  08/13/2012  *RADIOLOGY REPORT*  Clinical Data: Shortness of breath.  History of atrial fibrillation.  CHEST  - 2 VIEW  Comparison: Chest x-ray 11/19/2010.  Findings: No acute consolidative airspace disease.  No pleural effusions. There is cephalization of the pulmonary vasculature and slight indistinctness of the interstitial markings suggestive of mild pulmonary edema.  Mild cardiomegaly, and new compared to the prior examination.  Upper mediastinal contours are within normal limits.  IMPRESSION: 1.  New-onset of mild cardiomegaly, with findings suggestive of mild congestive heart failure, as above.  Clinical correlation is recommended.   Original Report Authenticated By: Trudie Reed, M.D.      No diagnosis found.    MDM  Duplicate note.        Doug Sou, MD 08/13/12 [phone]

## 2012-08-13 NOTE — ED Notes (Signed)
Report to Crystal, RN.

## 2012-08-13 NOTE — Progress Notes (Signed)
  Patient tolerated 1st bolus of amiodarone well and was on drip without problem. 2nd bolus of amiodarone administered and patient became presyncopal and diaphoretic. SBP > 200. Resolved.  A few minutes later patient became profoundly hypotensive and syncopal with SBP down to 44. +n/v and very diaphoretic. Placed on high dose levophed and given IVF bolus. Eventually SBP back up to 140. Levophed weaned off. Arterial line placed. Refusing central line.   Bedside echo done again. EF 25% range. No effusion.   Seen by Dr. Tyson Alias from [facility] as well. Being transferred to ICU.   Suspect idiosyncratic reaction to amiodarone. Amio stopped. Will plan TEE/DC-CV in am.   Bedside critical care provided by myself and Dr. Jens Som. Total time 70 minutes.    Danyel Bensimhon,MD 5:59 PM

## 2012-08-13 NOTE — ED Provider Notes (Addendum)
Complains  of shortness of breath with exertion onset possibly 3 weeks ago. Treated with antibiotics and prednisone, without relief. He denies chest pain denies other complaint. On exam patient is alert speaks in paragraphs lungs clear auscultation heart tachycardic irregularly irregular.  Date: 08/13/2012  Rate: 150  Rhythm: atrial fibrillation  QRS Axis: left  Intervals: normal  ST/T Wave abnormalities: nonspecific T wave changes  Conduction Disutrbances:none  Narrative Interpretation:   Old EKG Reviewed: Tracing from 05/06/2000 shows normal sinus rhythm, early repolarization otherwise within normal limits as interpreted by me  1:15 PM patient still remains tachycardic approximately 140 after treatment with intravenous Cardizem bolus and Cardizem intravenous drip [place] [facility] called to evaluate patient for admission and further treatment diagnosis Results for orders placed during the hospital encounter of 08/13/12  CBC WITH DIFFERENTIAL      Result Value Range   WBC 7.4  4.0 - 10.5 K/uL   RBC 4.38  4.22 - 5.81 MIL/uL   Hemoglobin 14.0  13.0 - 17.0 g/dL   HCT 11.9  14.7 - 82.9 %   MCV 91.6  78.0 - 100.0 fL   MCH 32.0  26.0 - 34.0 pg   MCHC 34.9  30.0 - 36.0 g/dL   RDW 56.2  13.0 - 86.5 %   Platelets 202  150 - 400 K/uL   Neutrophils Relative 76  43 - 77 %   Neutro Abs 5.7  1.7 - 7.7 K/uL   Lymphocytes Relative 15  12 - 46 %   Lymphs Abs 1.1  0.7 - 4.0 K/uL   Monocytes Relative 7  3 - 12 %   Monocytes Absolute 0.5  0.1 - 1.0 K/uL   Eosinophils Relative 1  0 - 5 %   Eosinophils Absolute 0.1  0.0 - 0.7 K/uL   Basophils Relative 0  0 - 1 %   Basophils Absolute 0.0  0.0 - 0.1 K/uL  PRO B NATRIURETIC PEPTIDE      Result Value Range   Pro B Natriuretic peptide (BNP) 2394.0 (*) 0 - 125 pg/mL  POCT I-STAT, CHEM 8      Result Value Range   Sodium 140  135 - 145 mEq/L   Potassium 4.4  3.5 - 5.1 mEq/L   Chloride 105  96 - 112 mEq/L   BUN 14  6 - 23 mg/dL   Creatinine, Ser 7.84   0.50 - 1.35 mg/dL   Glucose, Bld 696 (*) 70 - 99 mg/dL   Calcium, Ion 2.95 (*) 1.12 - 1.23 mmol/L   TCO2 27  0 - 100 mmol/L   Hemoglobin 13.9  13.0 - 17.0 g/dL   HCT 28.4  13.2 - 44.0 %  POCT I-STAT TROPONIN I      Result Value Range   Troponin i, poc 0.03  0.00 - 0.08 ng/mL   Comment 3            Dg Chest 2 View (if Patient Has Fever And/or Copd)  08/13/2012  *RADIOLOGY REPORT*  Clinical Data: Shortness of breath.  History of atrial fibrillation.  CHEST - 2 VIEW  Comparison: Chest x-ray 11/19/2010.  Findings: No acute consolidative airspace disease.  No pleural effusions. There is cephalization of the pulmonary vasculature and slight indistinctness of the interstitial markings suggestive of mild pulmonary edema.  Mild cardiomegaly, and new compared to the prior examination.  Upper mediastinal contours are within normal limits.  IMPRESSION: 1.  New-onset of mild cardiomegaly, with findings suggestive of mild congestive heart  failure, as above.  Clinical correlation is recommended.   Original Report Authenticated By: Trudie Reed, M.D.    Dx #1atrial fibrillation with rapid ventricular response #2 CHF  CRITICAL CARE Performed by: Doug Sou   Total critical care time:  30 minute  Critical care time was exclusive of separately billable procedures and treating other patients.  Critical care was necessary to treat or prevent imminent or life-threatening deterioration.  Critical care was time spent personally by me on the following activities: development of treatment plan with patient and/or surrogate as well as nursing, discussions with consultants, evaluation of patient's response to treatment, examination of patient, obtaining history from patient or surrogate, ordering and performing treatments and interventions, ordering and review of laboratory studies, ordering and review of radiographic studies, pulse oximetry and re-evaluation of patient's condition.  Doug Sou,  MD 08/13/12 1320   Medical screening examination/treatment/procedure(s) were conducted as a shared visit with non-physician practitioner(s) and myself.  I personally evaluated the patient during the encounter Doug Sou, MD 08/13/12 1709  Doug Sou, MD 08/13/12 1710

## 2012-08-13 NOTE — H&P (Signed)
PCP: [facility]: [name]   HPI:  Lance [name] is a 32 y/o male with h/o obesity, OSA (non-compliant with CPAP) and PAF.  Says he was diagnosed with AF in 2003. Saw Dr. [name] and had echo which was normal. Says episodes usually last a few minutes then resolve. Has not seen Dr. [name] in almost 8 years. Says episodes occur 3-4 days per week. Gets tachypalpitations, diaphoretic and dyspneic. Typically resolves in 30 mins.  In January had URI with persistent cough. About 3 weeks ago he started getting more dyspneic. Now can't walk more than 100 yards before getting dyspneic. +Orthopnea and PND. Sleeping upright in chair. +mild ankle edema.   Came to ER today due to symptoms. In ER AF with RVR at 150. pBNP 2394. Trop negative. Not wearing CPAP. Occasional ETOH. No tobacco. CXR with CHF and cardiomegaly.   I did bedside echo. Difficult images. EF looks to be about 30-35% + moderate MR. RV ok.   Started on dilt gtt in ER HR still 130-140.   Review of Systems:     Cardiac Review of Systems: {Y] = yes [ ]  = no  Chest Pain [    ]  Resting SOB [ y  ] Exertional SOB  Cove.Etienne  ]  Orthopnea [ y ]   Pedal Edema [  y ]    Palpitations [  y] Syncope  [  ]   Presyncope [   ]  General Review of Systems: [Y] = yes [  ]=no Constitional: recent weight change [  ]; anorexia [  ]; fatigue Cove.Etienne  ]; nausea [  ]; night sweats [  ]; fever [  ]; or chills [  ];                                                                                                                                           Eye : blurred vision [  ]; diplopia [   ]; vision changes [  ];  Amaurosis fugax[  ]; Resp: cough [  ];  wheezing[  ];  hemoptysis[  ]; shortness of breath[y  ]; paroxysmal nocturnal dyspnea[  y]; dyspnea on exertion[y  ]; or orthopnea[y  ];  GI:  gallstones[  ], vomiting[  ];  dysphagia[  ]; melena[  ];  hematochezia [  ]; heartburn[  ];   GU: kidney stones [  ]; hematuria[  ];   dysuria [  ];  nocturia[  ];  history of      obstruction [  ];                 Skin: rash, swelling[  ];, hair loss[  ];  peripheral edema[  ];  or itching[  ]; Musculosketetal: myalgias[  ];  joint swelling[  ];  joint erythema[  ];  joint pain[  ];  back pain[  ];  Heme/Lymph: bruising[  ];  bleeding[  ];  anemia[  ];  Neuro: TIA[  ];  headaches[  ];  stroke[  ];  vertigo[  ];  seizures[  ];   paresthesias[  ];  difficulty walking[  ];  Psych:depression[  ]; anxiety[  ];  Endocrine: diabetes[  ];  thyroid dysfunction[  ];  Other:  Past Medical History  Diagnosis Date  . History of atrial fibrillation 2003    Dr. [name]  . OSA on CPAP   . Onychomycosis      (Not in a hospital admission)   Allergies  Allergen Reactions  . Metoprolol Succinate Other (See Comments)    REACTION: achy    History   Social History  . Marital Status: Single    Spouse Name: N/A    Number of Children: N/A  . Years of Education: N/A   Occupational History  . car salesman    Social History Main Topics  . Smoking status: Former Games developer  . Smokeless tobacco: Not on file  . Alcohol Use: Yes     Comment: Occasioanl ETOH  . Drug Use: Not on file  . Sexually Active: Yes   Other Topics Concern  . Not on file   Social History Narrative  . No narrative on file    Family History  Problem Relation Age of Onset  . Diabetes Mother   . Hypertension Mother   . Depression Father   . Osteoarthritis Father   . Alcohol abuse Father     Meds: None   PHYSICAL EXAM: Filed Vitals:   08/13/12 1245  BP: 99/71  Pulse: 91  Temp:   Resp: 24   General:  Well appearing. Sitting straight upright HEENT: normal Neck: supple. JVP to jaw Carotids 2+ bilat; no bruits. No lymphadenopathy or thryomegaly appreciated. Cor: PMI laterally displaced. Tachy irregular No rubs, gallops  2/6 MR Lungs: bibasilar crackles Abdomen: soft, nontender, + distended. No hepatosplenomegaly. No bruits or masses. Good bowel sounds. Extremities: no cyanosis, clubbing,  rash, 1+ edema Neuro: alert & oriented x 3, cranial nerves grossly intact. moves all 4 extremities w/o difficulty. Affect pleasant.  ECG:  AF 152. LAFB. Lateral TWI  Results for orders placed during the hospital encounter of 08/13/12 (from the past 24 hour(s))  CBC WITH DIFFERENTIAL     Status: None   Collection Time    08/13/12  9:28 AM      Result Value Range   WBC 7.4  4.0 - 10.5 K/uL   RBC 4.38  4.22 - 5.81 MIL/uL   Hemoglobin 14.0  13.0 - 17.0 g/dL   HCT 16.1  09.6 - 04.5 %   MCV 91.6  78.0 - 100.0 fL   MCH 32.0  26.0 - 34.0 pg   MCHC 34.9  30.0 - 36.0 g/dL   RDW 40.9  81.1 - 91.4 %   Platelets 202  150 - 400 K/uL   Neutrophils Relative 76  43 - 77 %   Neutro Abs 5.7  1.7 - 7.7 K/uL   Lymphocytes Relative 15  12 - 46 %   Lymphs Abs 1.1  0.7 - 4.0 K/uL   Monocytes Relative 7  3 - 12 %   Monocytes Absolute 0.5  0.1 - 1.0 K/uL   Eosinophils Relative 1  0 - 5 %   Eosinophils Absolute 0.1  0.0 - 0.7 K/uL   Basophils Relative 0  0 - 1 %   Basophils Absolute 0.0  0.0 -  0.1 K/uL  PRO B NATRIURETIC PEPTIDE     Status: Abnormal   Collection Time    08/13/12  9:29 AM      Result Value Range   Pro B Natriuretic peptide (BNP) 2394.0 (*) 0 - 125 pg/mL  POCT I-STAT, CHEM 8     Status: Abnormal   Collection Time    08/13/12  9:50 AM      Result Value Range   Sodium 140  135 - 145 mEq/L   Potassium 4.4  3.5 - 5.1 mEq/L   Chloride 105  96 - 112 mEq/L   BUN 14  6 - 23 mg/dL   Creatinine, Ser 4.09  0.50 - 1.35 mg/dL   Glucose, Bld 811 (*) 70 - 99 mg/dL   Calcium, Ion 9.14 (*) 1.12 - 1.23 mmol/L   TCO2 27  0 - 100 mmol/L   Hemoglobin 13.9  13.0 - 17.0 g/dL   HCT 78.2  95.6 - 21.3 %  POCT I-STAT TROPONIN I     Status: None   Collection Time    08/13/12 12:00 PM      Result Value Range   Troponin i, poc 0.03  0.00 - 0.08 ng/mL   Comment 3            Dg Chest 2 View (if Patient Has Fever And/or Copd)  08/13/2012  *RADIOLOGY REPORT*  Clinical Data: Shortness of breath.  History of  atrial fibrillation.  CHEST - 2 VIEW  Comparison: Chest x-ray 11/19/2010.  Findings: No acute consolidative airspace disease.  No pleural effusions. There is cephalization of the pulmonary vasculature and slight indistinctness of the interstitial markings suggestive of mild pulmonary edema.  Mild cardiomegaly, and new compared to the prior examination.  Upper mediastinal contours are within normal limits.  IMPRESSION: 1.  New-onset of mild cardiomegaly, with findings suggestive of mild congestive heart failure, as above.  Clinical correlation is recommended.   Original Report Authenticated By: Trudie Reed, M.D.      ASSESSMENT: 1. PAF with RVR      --CHADs2 =1 (HF) 2. Acute systolic HF - likely tachycardia induced      -- EF ~30-35% (?) by bedside echo with moderate MR 3. OSA - noncompliant with CPAP 4. Obesity 5. Mitral regurgitation    PLAN/DISCUSSION:  He has new onset cardiomyopathy with HF in setting of rapid AF of unknown duration. 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 Will admit for diuresis. Start amio for rate control. Start Xarelto. Will need TEE and DC-CV on Monday.  Dr. [name] to decide on AF ablation vs anti-arrhythmic therapy long-term. Needs to comply with CPAP.   Matas Bensimhon,MD 1:42 PM

## 2012-08-13 NOTE — ED Notes (Signed)
Pt states he had a cold in December that "morphed into more about 3 weeks ago".  Pt states he was put on antibiotics (Septra and Clindamycin) as well as Prednisone, but continues to have SOB and cough.  Pt unsure if febrile.  Pt states cough is "crackly", but non-productive.

## 2012-08-13 NOTE — ED Provider Notes (Signed)
History     CSN: [id number]  Arrival date & time 08/13/12  4098   First MD Initiated Contact with Patient 08/13/12 0830      Chief Complaint  Patient presents with  . Shortness of Breath    (Consider location/radiation/quality/duration/timing/severity/associated sxs/prior treatment) HPI Comments: Patient with hx of sleep apnea and atrial fibrillation presents for SOB x 2.5 months that has not been resolving. Patient says symptoms started as a "regular cold" which included a dry cough through January. Patient states that in February his cough became "crackly" and he went to an Urgent Care for evaluation on 08/02/12. Patient given Bactrim, clindamycin, and 6 day taper of prednisone as well as Vicodin to take as needed. Patient states that these medications have given him no symptom relief and his chest still feels crackly. Also admits to progressing dyspnea on exertion x 4 weeks. SOB worse when lying down at night; patient has been sleeping upright x 1 month for this reason. Patient denies fever, visual disturbances, CP, N/V/D, abdominal pain, lower extremity edema, and hemoptysis.   Patient on CPAP for sleep apnea which he does not use regularly; last usage was ~07/28/12. Patient is followed by a cardiologist, Dr. [name] at [place], but has not been for an appt in approximately 6 years. Admits to being able to feel when his heart enters an irregular rhythm; last reports this happening around 3 days ago. States arrythmia is not timed with increasing SOB symptoms. Patient had asthma as child but noo asthma hx in last 20+ years.  Patient is a 32 y.o. male presenting with shortness of breath. The history is provided by the patient. No language interpreter was used.  Shortness of Breath Associated symptoms: cough   Associated symptoms: no abdominal pain, no chest pain, no fever, no headaches, no sore throat and no vomiting     Past Medical History  Diagnosis Date  . History of atrial fibrillation  2003    Dr. [name]  . OSA on CPAP   . Onychomycosis     Past Surgical History  Procedure Laterality Date  . Mandible surgery      underbite correction    Family History  Problem Relation Age of Onset  . Diabetes Mother   . Hypertension Mother   . Depression Father   . Osteoarthritis Father   . Alcohol abuse Father     History  Substance Use Topics  . Smoking status: Former Games developer  . Smokeless tobacco: Not on file  . Alcohol Use: Yes     Comment: Occasioanl ETOH     Review of Systems  Constitutional: Negative for fever and chills.  HENT: Negative for congestion, sore throat and rhinorrhea.   Respiratory: Positive for cough, shortness of breath and stridor.        + dyspnea on exertion  Cardiovascular: Negative for chest pain.  Gastrointestinal: Negative for nausea, vomiting, abdominal pain and diarrhea.  Genitourinary: Negative.   Musculoskeletal: Negative for back pain.  Skin: Negative for color change.  Neurological: Negative for syncope, numbness and headaches.  All other systems reviewed and are negative.    Allergies  Metoprolol succinate  Home Medications   Current Outpatient Rx  Name  Route  Sig  Dispense  Refill  . HYDROcodone-acetaminophen (NORCO/VICODIN) 5-325 MG per tablet   Oral   Take 1-2 tablets by mouth at bedtime as needed (for cough).          Marland Kitchen sulfamethoxazole-trimethoprim (BACTRIM DS) 800-160 MG per tablet  Oral   Take 1 tablet by mouth 2 (two) times daily.           BP 104/61  Pulse 75  Temp(Src) 98.2 F (36.8 C) (Oral)  Resp 23  SpO2 100%  Physical Exam  Nursing note and vitals reviewed. Constitutional: He is oriented to person, place, and time. He appears well-developed and well-nourished.  HENT:  Head: Normocephalic and atraumatic.  Eyes: Conjunctivae are normal. Pupils are equal, round, and reactive to light. Right eye exhibits no discharge. Left eye exhibits no discharge. No scleral icterus.  Neck: Normal range  of motion. Neck supple.  Cardiovascular: Regular rhythm, normal heart sounds and intact distal pulses.  Tachycardia present.   Pulmonary/Chest: Effort normal and breath sounds normal. No respiratory distress. He has no wheezes. He has no rales.  Abdominal: Soft. Bowel sounds are normal. He exhibits no distension. There is no tenderness. There is no rebound and no guarding.  Musculoskeletal: Normal range of motion. He exhibits no edema.  Lymphadenopathy:    He has no cervical adenopathy.  Neurological: He is alert and oriented to person, place, and time.  Skin: Skin is warm and dry. No rash noted. No erythema.  Psychiatric: He has a normal mood and affect. His behavior is normal.    ED Course  Procedures (including critical care time)  Labs Reviewed  PRO B NATRIURETIC PEPTIDE - Abnormal; Notable for the following:    Pro B Natriuretic peptide (BNP) 2394.0 (*)    All other components within normal limits  POCT I-STAT, CHEM 8 - Abnormal; Notable for the following:    Glucose, Bld 107 (*)    Calcium, Ion 1.24 (*)    All other components within normal limits  CBC WITH DIFFERENTIAL  POCT I-STAT TROPONIN I   Dg Chest 2 View (if Patient Has Fever And/or Copd)  08/13/2012  *RADIOLOGY REPORT*  Clinical Data: Shortness of breath.  History of atrial fibrillation.  CHEST - 2 VIEW  Comparison: Chest x-ray 11/19/2010.  Findings: No acute consolidative airspace disease.  No pleural effusions. There is cephalization of the pulmonary vasculature and slight indistinctness of the interstitial markings suggestive of mild pulmonary edema.  Mild cardiomegaly, and new compared to the prior examination.  Upper mediastinal contours are within normal limits.  IMPRESSION: 1.  New-onset of mild cardiomegaly, with findings suggestive of mild congestive heart failure, as above.  Clinical correlation is recommended.   Original Report Authenticated By: Trudie Reed, M.D.      1. Atrial fibrillation   2. Elevated  brain natriuretic peptide (BNP) level   3. Shortness of breath   4. Acute systolic heart failure      MDM  Patient is a 32 year old male with hx of A. Fib presents with SOB x  2 months and dyspnea on exertion x 4 weeks. Patient has taken abx and prednisone without relief prescribed to him 1 month ago by an Urgent Care. W/u to include CBC, chem 8, BNP, troponin and CXR. Patient has hx of Afib and is tachycardic ~100 on exam but in a regular rhythm.  On exam by Dr. Ethelda Chick, patient is tachy to the 150's with an irregularly irregular rhythm. 10mg  IV cardizem ordered.  BNP elevated to 2394; CXR shows new onset mild cardiomegaly without pleural effusion, consistent with mild CHF. Patient has also remained tachycardic after IV cardizem bolus and cardizem drip. Satting well on room air. Will admit to [facility] cardiology service for further work up and evaluation. Dr.  Jacubowitz has seen this patient with whom the work up and management was discussed.        Antony Madura, PA-C 08/14/12 [phone]

## 2012-08-13 NOTE — Consult Note (Signed)
PULMONARY  / CRITICAL CARE MEDICINE  Name: Lance [name] MRN: [id number] DOB: 1980-12-03    ADMISSION DATE:  08/13/2012 CONSULTATION DATE:  3/8  REFERRING MD :  Dr Clarise Cruz  CHIEF COMPLAINT:  SOB, acute resp failure, fib rvr  BRIEF PATIENT DESCRIPTION: 31 afib rvr, EF 25%, sudden shock after amio, resp failure  SIGNIFICANT EVENTS / STUDIES:  3/8- afib rvr, resp failure, amio, Hypotension, levophed  LINES / TUBES: PIV  CULTURES:  ANTIBIOTICS:   HISTORY OF PRESENT ILLNESS:  Lance [name] is a 31 y/o male with h/o obesity, OSA (non-compliant with CPAP) and PAF.  Says he was diagnosed with AF in 2003. Saw Dr. [name] and had echo which was normal. Says episodes usually last a few minutes then resolve. Has not seen Dr. [name] in almost 8 years. Says episodes occur 3-4 days per week. Gets tachypalpitations, diaphoretic and dyspneic. Progressive CHF symptoms. Came to ER today due to symptoms SOB.  IN 2900 found with tachy uncontrolled, treat amio with sudden HTN then hypoteniosn noted requiring levophed. EF looks to be about 30-35% + moderate MR. RV unclear per cards. Called to assist.  PAST MEDICAL HISTORY :  Past Medical History  Diagnosis Date  . History of atrial fibrillation 2003    Dr. [name]  . OSA on CPAP   . Onychomycosis    Past Surgical History  Procedure Laterality Date  . Mandible surgery      underbite correction   Prior to Admission medications   Medication Sig Start Date End Date Taking? Authorizing Provider  HYDROcodone-acetaminophen (NORCO/VICODIN) 5-325 MG per tablet Take 1-2 tablets by mouth at bedtime as needed (for cough).    Yes Historical Provider, MD  sulfamethoxazole-trimethoprim (BACTRIM DS) 800-160 MG per tablet Take 1 tablet by mouth 2 (two) times daily.   Yes Historical Provider, MD   Allergies  Allergen Reactions  . Amiodarone     Severe hypotension, shock after amiodarone bolus  . Metoprolol Succinate Other (See Comments)    REACTION: achy     FAMILY HISTORY:  Family History  Problem Relation Age of Onset  . Diabetes Mother   . Hypertension Mother   . Depression Father   . Osteoarthritis Father   . Alcohol abuse Father    SOCIAL HISTORY:  reports that he has quit smoking. He does not have any smokeless tobacco history on file. He reports that  drinks alcohol. His drug history is not on file.  REVIEW OF SYSTEMS:  Unable to obtain, distress  SUBJECTIVE:  distress  VITAL SIGNS: Temp:  [97.9 F (36.6 C)-98.2 F (36.8 C)] 98.1 F (36.7 C) (03/08 1500) Pulse Rate:  [38-156] 75 (03/08 1500) Resp:  [20-41] 23 (03/08 1500) BP: (95-129)/(61-107) 101/79 mmHg (03/08 1620) SpO2:  [96 %-100 %] 100 % (03/08 1500) HEMODYNAMICS:   VENTILATOR SETTINGS:   INTAKE / OUTPUT: Intake/Output     03/07 0701 - 03/08 0700 03/08 0701 - 03/09 0700   I.V.  10   Total Intake   10   Urine  1400   Total Output   1400   Net   -1390          PHYSICAL EXAMINATION: General:  resp distress Neuro:  Intact, nonfocal HEENT:  Obese, jvd Cardiovascular:  s1 s2 ORT distant Lungs:  coarse Abdomen:  Soft, BS wnl, no r/g Musculoskeletal:  Edema min  Skin:  No rash  LABS:  Recent Labs Lab 08/13/12 0928 08/13/12 0929 08/13/12 0950  HGB 14.0  --  13.9  WBC 7.4  --   --   PLT 202  --   --   NA  --   --  140  K  --   --  4.4  CL  --   --  105  GLUCOSE  --   --  107*  BUN  --   --  14  CREATININE  --   --  1.20  PROBNP  --  2394.0*  --    No results found for this basename: GLUCAP,  in the last 168 hours  CXR: cardiomegaly, edema int  ASSESSMENT / PLAN:  PULMONARY A: Acute resp failure, near arrest, likely pulm edema from diastolic HF, PE? P:   Likely needs to conitnue lasix, hold until BP improved pcxr in am Initiate BIPAP, 8/4, assess off in 4 hrs If distress further - abg  CARDIOVASCULAR A: refractory Afib RVR, near arrest after amio, PE? Hyperthyroidism?storm? P:  Per cards amio added as allergy Patient refusing  IV access Central line Echo Doppler legs Heparin TSH  RENAL A:  Rise in crt since 2012 P:   repeat chem  Now Lasix per cards  GASTROINTESTINAL A:  Near arrest P:   Npo ppi as shock  HEMATOLOGIC A:  fib P:  heparin  INFECTIOUS A:  No evidece infection P:   BC if spike  ENDOCRINE A:  R/o storm P:   tsh Low threshold stress steroids, add cortisol  NEUROLOGIC A:  Near arrest, neuro intact P:   Avoid sedation  TODAY'S SUMMARY: Near arrrest after amio, refractory rvr, refusing access. BIPAP, lasix?, wean pressors off as BP improved, tsh, chem now, may need CT chest angio  I have personally obtained a history, examined the patient, evaluated laboratory and imaging results, formulated the assessment and plan and placed orders. CRITICAL CARE: The patient is critically ill with multiple organ systems failure and requires high complexity decision making for assessment and support, frequent evaluation and titration of therapies, application of advanced monitoring technologies and extensive interpretation of multiple databases. Critical Care Time devoted to patient care services described in this note is 40 minutes.   Lance [name]. Lance Alias, MD, FACP Pgr: [phone] [facility]  Pulmonary and Critical Care Medicine [facility] Pager: [phone]  08/13/2012, 6:00 PM

## 2012-08-13 NOTE — Procedures (Signed)
Radial Artery Catheter Insertion Procedure Note Lance [name] [id number] 1981/03/29  Procedure: Insertion of Radial Artery Catheter Indications: Shock  Procedure Details Consent: Risks of procedure as well as the alternatives and risks of each were explained to the (patient/caregiver).  Consent for procedure obtained. Time Out: Verified patient identification, verified procedure, site/side was marked, verified correct patient position, special equipment/implants available, medications/allergies/relevent history reviewed, required imaging and test results available.  Performed  Maximum sterile technique was used including antiseptics, cap, gloves, hand hygiene and mask. Skin prep: Chlorhexidine; local anesthetic administered A standard radial artery catheter was inserted using standard dart kit.   Evaluation Blood flow good Complications: No apparent complications Patient did tolerate procedure well.    Thaddeus Bensimhon 08/13/2012, 6:03 PM

## 2012-08-13 NOTE — Progress Notes (Signed)
ANTICOAGULATION CONSULT NOTE - Initial Consult  Pharmacy Consult for heparin Indication: atrial fibrillation with RVR  Allergies  Allergen Reactions  . Metoprolol Succinate Other (See Comments)    REACTION: achy    Patient Measurements:   Heparin Dosing Weight: ~ 100 kg (will need to confirm when able to weigh pt).  Vital Signs: Temp: 98.1 F (36.7 C) (03/08 1500) Temp src: Oral (03/08 1500) BP: 101/79 mmHg (03/08 1620) Pulse Rate: 75 (03/08 1500)  Labs:  Recent Labs  08/13/12 0928 08/13/12 0950  HGB 14.0 13.9  HCT 40.1 41.0  PLT 202  --   CREATININE  --  1.20    The CrCl is unknown because both a height and weight (above a minimum accepted value) are required for this calculation.   Medical History: Past Medical History  Diagnosis Date  . History of atrial fibrillation 2003    Dr. [name]  . OSA on CPAP   . Onychomycosis     Medications:  Scheduled:  . [COMPLETED] sodium chloride   Intravenous Once  . amiodarone  150 mg Intravenous Once  . amiodarone  150 mg Intravenous Once  . digoxin  0.5 mg Intravenous Once  . [START ON 08/14/2012] digoxin  0.25 mg Oral Daily  . [COMPLETED] diltiazem  10 mg Intravenous Once  . fentaNYL      . furosemide  80 mg Intravenous BID  . heparin  5,000 Units Intravenous Once  . lidocaine (PF)      . midazolam      . potassium chloride  20 mEq Oral BID  . sodium chloride  3 mL Intravenous Q12H  . [DISCONTINUED] rivaroxaban  20 mg Oral QAC supper    Assessment: 32 yo male with afib with RVR, acute decompensation, hypotension with amiodarone bolus.  Bedside ECHO reveals EF ~ 30%.  Pharmacy asked to start IV heparin.  No anticoagulants PTA.  Initial CBC WNL.  Goal of Therapy:  Heparin level 0.3-0.7 units/ml Monitor platelets by anticoagulation protocol: Yes   Plan:  1. Start IV heparin with bolus of 5000 units IV x 1. 2. Then start heparin gtt at 1500 units/hr. 3. Check heparin level 6 hrs after gtt starts. 4. Daily  heparin level and CBC. 5. F/U plans for cardioversion and oral anticoagulation.  Tad Moore, BCPS  Clinical Pharmacist Pager [phone]  08/13/2012 5:53 PM

## 2012-08-14 ENCOUNTER — Encounter (HOSPITAL_COMMUNITY)
Admission: EM | Disposition: A | Discharge status: Home / Self Care | Payer: Self-pay | Source: Home / Self Care | Attending: Internal Medicine

## 2012-08-14 ENCOUNTER — Encounter (HOSPITAL_COMMUNITY): Payer: Self-pay | Admitting: Anesthesiology

## 2012-08-14 ENCOUNTER — Other Ambulatory Visit: Payer: Self-pay

## 2012-08-14 ENCOUNTER — Inpatient Hospital Stay (HOSPITAL_COMMUNITY): Payer: Self-pay | Admitting: Anesthesiology

## 2012-08-14 DIAGNOSIS — I4891 Unspecified atrial fibrillation: Secondary | ICD-10-CM

## 2012-08-14 DIAGNOSIS — R0602 Shortness of breath: Secondary | ICD-10-CM

## 2012-08-14 DIAGNOSIS — R609 Edema, unspecified: Secondary | ICD-10-CM

## 2012-08-14 DIAGNOSIS — R799 Abnormal finding of blood chemistry, unspecified: Secondary | ICD-10-CM

## 2012-08-14 HISTORY — PX: CARDIOVERSION: SHX1299

## 2012-08-14 LAB — BASIC METABOLIC PANEL
CO2: 24 mEq/L (ref 19–32)
CO2: 25 mEq/L (ref 19–32)
Calcium: 9.3 mg/dL (ref 8.4–10.5)
Glucose, Bld: 101 mg/dL — ABNORMAL HIGH (ref 70–99)
Glucose, Bld: 97 mg/dL (ref 70–99)
Potassium: 4 mEq/L (ref 3.5–5.1)
Sodium: 140 mEq/L (ref 135–145)
Sodium: 138 mEq/L (ref 135–145)

## 2012-08-14 LAB — CBC
Platelets: 194 10*3/uL (ref 150–400)
RBC: 4.25 MIL/uL (ref 4.22–5.81)
RDW: 14.2 % (ref 11.5–15.5)
WBC: 9.7 10*3/uL (ref 4.0–10.5)

## 2012-08-14 LAB — HEPARIN LEVEL (UNFRACTIONATED)
Heparin Unfractionated: 0.47 IU/mL (ref 0.30–0.70)
Heparin Unfractionated: 0.66 IU/mL (ref 0.30–0.70)

## 2012-08-14 SURGERY — CARDIOVERSION
Anesthesia: General | Wound class: Clean

## 2012-08-14 MED ORDER — MAGNESIUM SULFATE 40 MG/ML IJ SOLN
2.0000 g | Freq: Once | INTRAMUSCULAR | Status: AC
  Administered 2012-08-14: 2 g via INTRAVENOUS
  Filled 2012-08-14: qty 50

## 2012-08-14 MED ORDER — SODIUM CHLORIDE 0.9 % IJ SOLN
3.0000 mL | INTRAMUSCULAR | Status: DC | PRN
  Administered 2012-08-16: 3 mL via INTRAVENOUS

## 2012-08-14 MED ORDER — SODIUM CHLORIDE 0.9 % IJ SOLN
3.0000 mL | Freq: Two times a day (BID) | INTRAMUSCULAR | Status: DC
  Administered 2012-08-14 – 2012-08-16 (×4): 3 mL via INTRAVENOUS

## 2012-08-14 MED ORDER — MIDAZOLAM HCL 2 MG/2ML IJ SOLN
6.0000 mg | Freq: Once | INTRAMUSCULAR | Status: AC
  Administered 2012-08-14: 2 mg via INTRAVENOUS

## 2012-08-14 MED ORDER — MIDAZOLAM HCL 2 MG/2ML IJ SOLN
INTRAMUSCULAR | Status: AC
  Administered 2012-08-14: 4 mg
  Filled 2012-08-14: qty 8

## 2012-08-14 MED ORDER — PROPOFOL 10 MG/ML IV BOLUS
INTRAVENOUS | Status: DC | PRN
  Administered 2012-08-14: 30 mg via INTRAVENOUS
  Administered 2012-08-14: 50 mg via INTRAVENOUS

## 2012-08-14 MED ORDER — FUROSEMIDE 40 MG PO TABS
40.0000 mg | ORAL_TABLET | Freq: Every day | ORAL | Status: DC
  Administered 2012-08-14 – 2012-08-22 (×9): 40 mg via ORAL
  Filled 2012-08-14 (×11): qty 1

## 2012-08-14 MED ORDER — FENTANYL CITRATE 0.05 MG/ML IJ SOLN: 100.0000 ug | Freq: Once | INTRAMUSCULAR | Status: AC

## 2012-08-14 MED ORDER — SODIUM CHLORIDE 0.9 % IV SOLN: 250.0000 mL | INTRAVENOUS | Status: DC | PRN

## 2012-08-14 MED ORDER — AMIODARONE HCL 200 MG PO TABS
400.0000 mg | ORAL_TABLET | Freq: Three times a day (TID) | ORAL | Status: DC
  Administered 2012-08-14 – 2012-08-22 (×23): 400 mg via ORAL
  Filled 2012-08-14 (×29): qty 2

## 2012-08-14 MED ORDER — METOPROLOL SUCCINATE ER 50 MG PO TB24
50.0000 mg | ORAL_TABLET | Freq: Two times a day (BID) | ORAL | Status: DC
  Administered 2012-08-14 – 2012-08-20 (×13): 50 mg via ORAL
  Filled 2012-08-14 (×17): qty 1

## 2012-08-14 MED ORDER — DOFETILIDE 500 MCG PO CAPS
500.0000 ug | ORAL_CAPSULE | Freq: Two times a day (BID) | ORAL | Status: DC
  Administered 2012-08-14: 500 ug via ORAL
  Filled 2012-08-14 (×2): qty 1

## 2012-08-14 MED ORDER — SODIUM CHLORIDE 0.9 % IV SOLN: INTRAVENOUS | Status: DC

## 2012-08-14 MED ORDER — FENTANYL CITRATE 0.05 MG/ML IJ SOLN
INTRAMUSCULAR | Status: AC
  Administered 2012-08-14: 100 ug
  Filled 2012-08-14: qty 4

## 2012-08-14 NOTE — Progress Notes (Signed)
Placed pt. On bipap. Pt. Is tolerating well at this time. RT to monitor. 

## 2012-08-14 NOTE — Progress Notes (Signed)
VASCULAR LAB PRELIMINARY  PRELIMINARY  PRELIMINARY  PRELIMINARY  Bilateral lower extremity venous Dopplers completed.    Preliminary report:  There is no DVT or SVT noted in the bilateral lower extremities.  KANADY, CANDACE, RVT 08/14/2012, 10:07 AM

## 2012-08-14 NOTE — Progress Notes (Addendum)
Subjective:    Feels much better this am. BP stable off pressors. Remains in AF with RVR. Breathing much better with diuresis.   Intake/Output Summary (Last 24 hours) at 08/14/12 1003 Last data filed at 08/14/12 0900  Gross per 24 hour  Intake  812.5 ml  Output   5025 ml  Net -4212.5 ml    Current meds: . digoxin  0.25 mg Oral Daily  . furosemide  80 mg Intravenous BID  . pantoprazole (PROTONIX) IV  40 mg Intravenous Q24H  . potassium chloride  20 mEq Oral BID  . sodium chloride  3 mL Intravenous Q12H   Infusions: . heparin 1,500 Units/hr (08/14/12 0813)  . norepinephrine (LEVOPHED) Adult infusion Stopped (08/13/12 1830)     Objective:  Blood pressure 100/54, pulse 143, temperature 98.9 F (37.2 C), temperature source Oral, resp. rate 18, weight 125.2 kg (276 lb 0.3 oz), SpO2 98.00%. Weight change:   Physical Exam: General: Well appearing. HEENT: normal x for mild exophthalmosis Neck: supple. JVP 8  Carotids 2+ bilat; no bruits. No lymphadenopathy or thryomegaly appreciated.  Cor: PMI laterally displaced. Tachy irregular No rubs, gallops 2/6 MR  Lungs: clear Abdomen: soft, nontender, distension resolved. No hepatosplenomegaly. No bruits or masses. Good bowel sounds.  Extremities: no cyanosis, clubbing, rash, no edema Neuro: alert & oriented x 3, cranial nerves grossly intact. moves all 4 extremities w/o difficulty. Affect pleasant.   Telemetry: AF 130-140s  Lab Results: Basic Metabolic Panel:  Recent Labs Lab 08/13/12 0950 08/13/12 1807 08/13/12 1808 08/14/12 0545  NA 140  --  137 140  K 4.4  --  4.1 4.0  CL 105  --  102 105  CO2  --   --  21 24  GLUCOSE 107*  --  126* 101*  BUN 14  --  14 13  CREATININE 1.20  --  1.16 1.11  CALCIUM  --   --  9.1 9.0  MG  --  1.8  --   --   PHOS  --  3.1  --   --    Liver Function Tests: No results found for this basename: AST, ALT, ALKPHOS, BILITOT, PROT, ALBUMIN,  in the last 168 hours No results found for this  basename: LIPASE, AMYLASE,  in the last 168 hours No results found for this basename: AMMONIA,  in the last 168 hours CBC:  Recent Labs Lab 08/13/12 0928 08/13/12 0950 08/14/12 0545  WBC 7.4  --  9.7  NEUTROABS 5.7  --   --   HGB 14.0 13.9 13.4  HCT 40.1 41.0 38.7*  MCV 91.6  --  91.1  PLT 202  --  194   Cardiac Enzymes: No results found for this basename: CKTOTAL, CKMB, CKMBINDEX, TROPONINI,  in the last 168 hours BNP: No components found with this basename: POCBNP,  CBG: No results found for this basename: GLUCAP,  in the last 168 hours Microbiology: No results found for this basename: cult   No results found for this basename: CULT, SDES,  in the last 168 hours  Imaging: Dg Chest 2 View (if Patient Has Fever And/or Copd)  08/13/2012  *RADIOLOGY REPORT*  Clinical Data: Shortness of breath.  History of atrial fibrillation.  CHEST - 2 VIEW  Comparison: Chest x-ray 11/19/2010.  Findings: No acute consolidative airspace disease.  No pleural effusions. There is cephalization of the pulmonary vasculature and slight indistinctness of the interstitial markings suggestive of mild pulmonary edema.  Mild cardiomegaly, and new compared  to the prior examination.  Upper mediastinal contours are within normal limits.  IMPRESSION: 1.  New-onset of mild cardiomegaly, with findings suggestive of mild congestive heart failure, as above.  Clinical correlation is recommended.   Original Report Authenticated By: Trudie Reed, M.D.      ASSESSMENT:  1. PAF with RVR  --CHADs2 =1 (HF)  2. Acute systolic HF - likely tachycardia induced  -- EF ~30% (?) by bedside echo with moderate MR  3. OSA - noncompliant with CPAP  4. Obesity  5. Mitral regurgitation  6. Profound hypotension/shock with amio 7. Hypomagnesemia  PLAN/DISCUSSION:  Unable to tolerate amio due to development of profound shock immediately after bolus administration. Will plan TEE/DC-CV today. Will likely need Tikosyn post DC-CV to  maintain NSR. Will supp mag. TSH pending  Volume status much better with diuresis. Can change to po lasix.   EF likely will improve with restoration of NSR. Start ACE-I and b-block after DC-CV as BP and HR tolerate.    LOS: 1 day    Lance Meres, MD 08/14/2012, 10:03 AM

## 2012-08-14 NOTE — Progress Notes (Signed)
ANTICOAGULATION CONSULT NOTE  Pharmacy Consult for heparin Indication: atrial fibrillation with RVR  Allergies  Allergen Reactions  . Amiodarone     Severe hypotension, shock after amiodarone bolus  . Metoprolol Succinate Other (See Comments)    REACTION: achy    Patient Measurements: Weight: 276 lb 0.3 oz (125.2 kg) Heparin Dosing Weight: ~ 100 kg (will need to confirm when able to weigh pt).  Vital Signs: Temp: 98.9 F (37.2 C) (03/09 0400) Temp src: Oral (03/09 0000) BP: 100/54 mmHg (03/09 0609) Pulse Rate: 145 (03/09 0609)  Labs:  Recent Labs  08/13/12 0928 08/13/12 0950 08/13/12 1808 08/14/12 0545  HGB 14.0 13.9  --  13.4  HCT 40.1 41.0  --  38.7*  PLT 202  --   --  194  HEPARINUNFRC  --   --   --  0.47  CREATININE  --  1.20 1.16  --     The CrCl is unknown because both a height and weight (above a minimum accepted value) are required for this calculation.    Assessment: 32 yo male with afib for heparin  Goal of Therapy:  Heparin level 0.3-0.7 units/ml Monitor platelets by anticoagulation protocol: Yes   Plan:  Continue Heparin at current rate   Geannie Risen, PharmD, BCPS    08/14/2012 6:42 AM

## 2012-08-14 NOTE — Progress Notes (Signed)
Placed pt. On bipap as per order. Order states 4hrs on and 1 hour off. RT will continue to monitor Pt.

## 2012-08-14 NOTE — Progress Notes (Signed)
ANTICOAGULATION CONSULT NOTE - Follow Up Consult  Pharmacy Consult for heparin/Tikosyn monitoring Indication: atrial fibrillation with RVR  Allergies  Allergen Reactions  . Amiodarone     Severe hypotension, shock after amiodarone bolus  . Metoprolol Succinate Other (See Comments)    REACTION: achy    Patient Measurements: Weight: 276 lb 0.3 oz (125.2 kg) IBW: 82.2kg Heparin Dosing Weight: 110kg  Vital Signs: Temp: 98.9 F (37.2 C) (03/09 0400) Temp src: Oral (03/09 0800) BP: 108/60 mmHg (03/09 1206) Pulse Rate: 160 (03/09 1206)  Labs:  Recent Labs  08/13/12 0928 08/13/12 0950 08/13/12 1808 08/14/12 0545  HGB 14.0 13.9  --  13.4  HCT 40.1 41.0  --  38.7*  PLT 202  --   --  194  HEPARINUNFRC  --   --   --  0.47  CREATININE  --  1.20 1.16 1.11    The CrCl is unknown because both a height and weight (above a minimum accepted value) are required for this calculation.   Assessment: 87 yoM admitted 3/8 in Afib w RVR and increased SOB.   Heparin per Pharmacy for PAF w RVR. HL in therapeutic range at 0.66 on 1500 units/hr. CBC stable, no bleeding noted. No DVT seen on dopplers 3/9  Discussed oral anticoagulation with Dr. [name] who would like patient to remain on UFH for 1-2 more days before transition to another agent  Unsuccessful DCCV this AM, patient to start on Toprol and Tikosyn. QTc , K=4.0, Mag=1.8= repleted, renal function stable.   Goal of Therapy:  Heparin level 0.3-0.7 units/ml Monitor platelets by anticoagulation protocol: Yes   Plan:  - Continue heparin at 1500 units/hr - daily HL, CBC - Ok to start Tikosyn, will continue to follow QTc, K, Mag, renal function - F/u oral anticoagulation plans   Thank you for the consult.  Tomi Bamberger, PharmD Clinical Pharmacist Pager: [phone] Pharmacy: [phone] 08/14/2012 2:13 PM

## 2012-08-14 NOTE — Anesthesia Postprocedure Evaluation (Signed)
  Anesthesia Post-op Note  Patient: Lance [name]  Procedure(s) Performed: Procedure(s): CARDIOVERSION (N/A)  Patient Location: ICU  Anesthesia Type:General  Level of Consciousness: awake, alert  and oriented  Airway and Oxygen Therapy: Patient Spontanous Breathing and BiPap  Post-op Pain: none  Post-op Assessment: Post-op Vital signs reviewed, Patient's Cardiovascular Status Stable, Respiratory Function Stable, Patent Airway, No signs of Nausea or vomiting, Pain level controlled, No headache, No backache, No residual numbness and No residual motor weakness  Post-op Vital Signs: Reviewed and stable  Complications: No apparent anesthesia complications

## 2012-08-14 NOTE — Progress Notes (Signed)
Dr [name] called about increased PVCs s/p Tikosyn. Will continue to monitor at this time.

## 2012-08-14 NOTE — Progress Notes (Signed)
Pt already off Bipap on 2L [place] on arrival to room. No distress noted. Order is for pt to be on bipap X4hrs and off X1hr-  cycle that X24hrs, pt states has been off bipap X1hr but does not want to go back on at this time. Explained to pt md order and pt continues to refuse bipap at this time. Rt will continue to monitor

## 2012-08-14 NOTE — Progress Notes (Addendum)
  Patient having polymorphic PVCs after 1st dose of Tikosyn. I worry about risk of Torsades with ongoing therapy.   I discussed situation with Dr. Graciela Husbands who feels that reaction to IV amiodarone was likely due to soap carrier and not amio itself. Will stop Tikosyn and attempt cautious oral load of amiodarone. Discussed with patient and he is ok with proceeding.   Tip Talvin Christianson,MD 10:18 PM

## 2012-08-14 NOTE — Progress Notes (Signed)
PULMONARY  / CRITICAL CARE MEDICINE  Name: Lance [name] MRN: [id number] DOB: 1980-12-01    ADMISSION DATE:  08/13/2012 CONSULTATION DATE:  3/8  REFERRING MD :  Dr Clarise Cruz  CHIEF COMPLAINT:  SOB, acute resp failure, fib rvr  BRIEF PATIENT DESCRIPTION: 31 afib rvr, EF 25%, sudden shock after amio, resp failure  SIGNIFICANT EVENTS / STUDIES:  3/8- afib rvr, resp failure, amio, Hypotension, levophed  LINES / TUBES: PIV  CULTURES:  ANTIBIOTICS:  SUBJECTIVE: Pt reports he continues to have palpitations, mild shortness of breath but better with bipap.  RN reports pt off pressors.  No distress. Neg balance  VITAL SIGNS: Temp:  [98 F (36.7 C)-98.9 F (37.2 C)] 98.9 F (37.2 C) (03/09 0400) Pulse Rate:  [27-157] 147 (03/09 0800) Resp:  [17-41] 20 (03/09 0609) BP: (66-129)/(30-107) 100/54 mmHg (03/09 0609) SpO2:  [86 %-100 %] 100 % (03/09 0800) Arterial Line BP: (92-132)/(46-63) 112/63 mmHg (03/09 0800) FiO2 (%):  [30 %] 30 % (03/09 0800) Weight:  [276 lb 0.3 oz (125.2 kg)] 276 lb 0.3 oz (125.2 kg) (03/09 0500)  HEMODYNAMICS:    VENTILATOR SETTINGS: Vent Mode:  [-]  FiO2 (%):  [30 %] 30 %  INTAKE / OUTPUT: Intake/Output     03/08 0701 - 03/09 0700 03/09 0701 - 03/10 0700   I.V. (mL/kg) 792.5 (6.3) 20 (0.2)   Total Intake(mL/kg) 792.5 (6.3) 20 (0.2)   Urine (mL/kg/hr) 3825    Total Output 3825     Net -3032.5 +20          PHYSICAL EXAMINATION: General:  resp distress no longer with NIMV Neuro:  Intact, nonfocal HEENT:  Obese, jvd Cardiovascular:  s1 s2, irr irr, distant tones, no edema Lungs:  coarse Abdomen:  Soft, BS wnl, no r/g Musculoskeletal:  No acute deformities Skin:  No rash  LABS:  Recent Labs Lab 08/13/12 0928 08/13/12 0929  08/13/12 0950 08/13/12 1807 08/13/12 1808 08/14/12 0545  HGB 14.0  --   --  13.9  --   --  13.4  WBC 7.4  --   --   --   --   --  9.7  PLT 202  --   --   --   --   --  194  NA  --   --   --  140  --  137 140  K   --   --   < > 4.4  --  4.1 4.0  CL  --   --   --  105  --  102 105  CO2  --   --   --   --   --  21 24  GLUCOSE  --   --   --  107*  --  126* 101*  BUN  --   --   --  14  --  14 13  CREATININE  --   --   --  1.20  --  1.16 1.11  CALCIUM  --   --   --   --   --  9.1 9.0  MG  --   --   --   --  1.8  --   --   PHOS  --   --   --   --  3.1  --   --   PROBNP  --  2394.0*  --   --   --   --   --   < > = values in  this interval not displayed. No results found for this basename: GLUCAP,  in the last 168 hours  CXR: 3/8 cardiomegaly, interstitial edema   ASSESSMENT / PLAN:  PULMONARY A:  Acute resp failure, near arrest, likely pulm edema from diastolic HF, PE?  P:   Lasix per cardiology pcxr in am Cycle on bipap, but allow 4 hrs off break and assess needs at that point Continue lasix, tolerating well Noncompliant osa, cpap at home, re assess compliance post bipap needs  CARDIOVASCULAR A:  Refractory Afib RVR, near arrest after amio, PE? Hyperthyroidism?storm?  P:  Per cards amio added as allergy Echo repeat TEE - EF further reduced, for cardioversion Doppler legs neg Heparin TSH awaited Lasix, maintain May need spiral ct chest   RENAL A:   Rise in crt since 2012  P:   Follow BMP Lasix per Cardiology  GASTROINTESTINAL A:   Near arrest, SUP proph  P:   Npo for cardioversion ppi with shock  HEMATOLOGIC A:   Afib / DVT proph  P:  heparin  INFECTIOUS A:  No evidece infection P:   BC if spike  ENDOCRINE A:   R/o storm  P:   Check TSH Low threshold stress steroids, add cortisol  NEUROLOGIC A:  Near arrest, neuro intact P:   Avoid sedation when able  Canary Brim, NP-C [facility] Pgr: [phone] or [phone]  I have personally obtained a history, examined the patient, evaluated laboratory and imaging results, formulated the assessment and plan and placed orders.  Mcarthur Rossetti. Tyson Alias, MD, FACP Pgr: [phone] [facility]  Pulmonary and Critical Care Medicine [facility] Pager: [phone]  08/14/2012, 8:48 AM

## 2012-08-14 NOTE — Progress Notes (Signed)
RN took pt. Off bipap for a break. Pt. Tolerating well currently on room air.

## 2012-08-14 NOTE — Transfer of Care (Signed)
Immediate Anesthesia Transfer of Care Note  Patient: Lance [name]  Procedure(s) Performed: Procedure(s): CARDIOVERSION (N/A)  Patient Location: ICU  Anesthesia Type:General  Level of Consciousness: awake and oriented  Airway & Oxygen Therapy: Patient Spontanous Breathing and BiPap  Post-op Assessment: Report given to PACU RN and Post -op Vital signs reviewed and stable  Post vital signs: Reviewed and stable  Complications: No apparent anesthesia complications

## 2012-08-14 NOTE — Progress Notes (Signed)
Called to room after cardioversion, able to wake pt easily but placed on Bipap at this time until pt more awake, rt will moniotr

## 2012-08-14 NOTE — CV Procedure (Signed)
    TRANSESOPHAGEAL ECHOCARDIOGRAM GUIDED DIRECT CURRENT CARDIOVERSION  NAME:  Lance [name]   MRN: [id number] DOB:  1981/05/14   ADMIT DATE: 08/13/2012  INDICATIONS:  AF with RVR   PROCEDURE:   Informed consent was obtained prior to the procedure. The risks, benefits and alternatives for the procedure were discussed and the patient comprehended these risks.  Risks include, but are not limited to, cough, sore throat, vomiting, nausea, somnolence, esophageal and stomach trauma or perforation, bleeding, low blood pressure, aspiration, pneumonia, infection, trauma to the teeth and death.    After a procedural time-out, the patient was given 6 mg versed and 100 mcg fentanyl for moderate sedation.  The oropharynx was anesthetized with cetacaine spray.  The transesophageal probe was inserted in the esophagus and stomach without difficulty and multiple views were obtained.     FINDINGS:  LEFT VENTRICLE: EF = Dilated. EF 10-15% Global HK.   RIGHT VENTRICLE: Severe global HK  LEFT ATRIUM: Dilated  LEFT ATRIAL APPENDAGE: No clot  RIGHT ATRIUM: Normal  AORTIC VALVE:  Trileaflet. No AI/AS  MITRAL VALVE:    Dilated annulus. Mild to moderate MR  TRICUSPID VALVE: Normal. Mild TR  PULMONIC VALVE: No visualized  INTERATRIAL SEPTUM: No PFO/ASD  PERICARDIUM: Trivial effusion  DESCENDING AORTA: Not visualized   CARDIOVERSION:     Indications:  Atrial Fibrillation  Procedure Details:  Once the TEE was complete, the patient had the defibrillator pads placed in the anterior and posterior position. The patient then underwent further sedation by the anesthesia service with diprivan for cardioversion. Once an appropriate level of sedation was achieved, the patient received a biphasic, synchronized 200J shock with no effect. He then underwent a repeat 200J shock as I held manual pressure on the anterior pad. This was unsuccessful as well. No apparent complications.  COMPLICATIONS:    There  were no immediate complications.   CONCLUSION:   1.  Unsuccessful TEE guided cardioversion.  2. Will start low-dose lopressor & dofetilide bid (Discussed with Dr. [name] in EP)   Truman Hayward 11:30 AM

## 2012-08-14 NOTE — Progress Notes (Signed)
  Echocardiogram Echocardiogram Transesophageal has been performed.  MORFORD, MELISSA 08/14/2012, 11:35 AM

## 2012-08-14 NOTE — Anesthesia Preprocedure Evaluation (Signed)
Anesthesia Evaluation  Patient identified by MRN, date of birth, ID band Patient awake    Reviewed: Allergy & Precautions, H&P , NPO status , Patient's Chart, lab work & pertinent test results  History of Anesthesia Complications Negative for: history of anesthetic complications  Airway Mallampati: II TM Distance: >3 FB   Mouth opening: Limited Mouth Opening  Dental  (+) Dental Advisory Given   Pulmonary sleep apnea and Continuous Positive Airway Pressure Ventilation ,  breath sounds clear to auscultation        Cardiovascular + Peripheral Vascular Disease + dysrhythmias Atrial Fibrillation Rhythm:Irregular Rate:Tachycardia     Neuro/Psych negative neurological ROS  negative psych ROS   GI/Hepatic negative GI ROS, Neg liver ROS,   Endo/Other  negative endocrine ROS  Renal/GU negative Renal ROS     Musculoskeletal   Abdominal   Peds  Hematology negative hematology ROS (+)   Anesthesia Other Findings   Reproductive/Obstetrics                           Anesthesia Physical Anesthesia Plan  ASA: III and emergent  Anesthesia Plan: General   Post-op Pain Management:    Induction: Intravenous  Airway Management Planned: Mask  Additional Equipment:   Intra-op Plan:   Post-operative Plan:   Informed Consent: I have reviewed the patients History and Physical, chart, labs and discussed the procedure including the risks, benefits and alternatives for the proposed anesthesia with the patient or authorized representative who has indicated his/her understanding and acceptance.   Dental advisory given  Plan Discussed with: CRNA, Anesthesiologist and Surgeon  Anesthesia Plan Comments:         Anesthesia Quick Evaluation

## 2012-08-14 NOTE — Care Management (Signed)
CARE MANAGEMENT NOTE 08/14/2012  Patient:  Lance [name], Lance [name]   Account Number:  [id number]  Date Initiated:  08/14/2012  Documentation initiated by:  Huntsman,Londyn Wotton  Subjective/Objective Assessment:   31 yo male admitted with cardiomyopathy. Pt currently on Bi-PAP, underwent cardioversion 08-14-12. Cm unable to assess needs. No payor. Pt referred to financial counselor Dina Rich @ [phone] for possible medicaid eligibilty.     Action/Plan:   Anticipated DC Date:     Anticipated DC Plan:           Choice offered to / List presented to:             Status of service:   Medicare Important Message given?   (If response is "NO", the following Medicare IM given date fields will be blank) Date Medicare IM given:   Date Additional Medicare IM given:    Discharge Disposition:    Per UR Regulation:    If discussed at Long Length of Stay Meetings, dates discussed:    Comments:

## 2012-08-14 NOTE — Progress Notes (Signed)
Pt. Has a bipap schedule for 4 hours on and 1 hour off, but pt. Refuses to go on at this time. Pt. States he wants to go on bipap at 11pm. RT will check back with pt. At 11pm, as he requested.

## 2012-08-15 ENCOUNTER — Encounter (HOSPITAL_COMMUNITY): Payer: Self-pay | Admitting: Internal Medicine

## 2012-08-15 ENCOUNTER — Inpatient Hospital Stay (HOSPITAL_COMMUNITY): Payer: Self-pay

## 2012-08-15 DIAGNOSIS — R0602 Shortness of breath: Secondary | ICD-10-CM

## 2012-08-15 DIAGNOSIS — I059 Rheumatic mitral valve disease, unspecified: Secondary | ICD-10-CM

## 2012-08-15 DIAGNOSIS — R091 Pleurisy: Secondary | ICD-10-CM

## 2012-08-15 LAB — CBC
HCT: 42.6 % (ref 39.0–52.0)
MCV: 91.6 fL (ref 78.0–100.0)
RBC: 4.65 MIL/uL (ref 4.22–5.81)
WBC: 9.3 10*3/uL (ref 4.0–10.5)

## 2012-08-15 LAB — BASIC METABOLIC PANEL
CO2: 24 mEq/L (ref 19–32)
Glucose, Bld: 131 mg/dL — ABNORMAL HIGH (ref 70–99)
Potassium: 4.6 mEq/L (ref 3.5–5.1)
Sodium: 137 mEq/L (ref 135–145)

## 2012-08-15 MED ORDER — PERFLUTREN LIPID MICROSPHERE
INTRAVENOUS | Status: AC
  Administered 2012-08-15: 2 mL
  Filled 2012-08-15: qty 10

## 2012-08-15 MED ORDER — PANTOPRAZOLE SODIUM 40 MG PO TBEC
40.0000 mg | DELAYED_RELEASE_TABLET | Freq: Every day | ORAL | Status: DC
  Administered 2012-08-15 – 2012-08-22 (×8): 40 mg via ORAL
  Filled 2012-08-15 (×10): qty 1

## 2012-08-15 MED ORDER — PERFLUTREN LIPID MICROSPHERE
1.0000 mL | INTRAVENOUS | Status: AC | PRN
  Administered 2012-08-15: 2 mL via INTRAVENOUS
  Filled 2012-08-15: qty 10

## 2012-08-15 NOTE — Progress Notes (Signed)
Patient ID: Lance [name], male   DOB: 1980-06-26, 32 y.o.   MRN: [id number] Subjective:  Dyspnea improved  Objective:  Vital Signs in the last 24 hours: Temp:  [96.5 F (35.8 C)-98.8 F (37.1 C)] 98 F (36.7 C) (03/10 0813) Pulse Rate:  [48-160] 60 (03/10 0800) Resp:  [16-22] 18 (03/10 0800) BP: (82-118)/(37-91) 118/87 mmHg (03/10 0800) SpO2:  [89 %-100 %] 100 % (03/10 0800) Arterial Line BP: (92-127)/(52-99) 103/99 mmHg (03/09 1400) FiO2 (%):  [30 %] 30 % (03/10 0500) Weight:  [273 lb 13 oz (124.2 kg)] 273 lb 13 oz (124.2 kg) (03/10 0458)  Intake/Output from previous day: 03/09 0701 - 03/10 0700 In: 1420 [P.O.:950; I.V.:420; IV Piggyback:50] Out: 3025 [Urine:3025] Intake/Output from this shift: Total I/O In: 115 [P.O.:100; I.V.:15] Out: -   Physical Exam: Well appearing NAD HEENT: Unremarkable Neck:  7 cm JVD, no thyromegally Lungs:  Clear with no wheezes, minimal rales HEART:  IRegular tachy rhythm, no murmurs, no rubs, no clicks Abd:  soft, positive bowel sounds, no organomegally, no rebound, no guarding Ext:  2 plus pulses, no edema, no cyanosis, no clubbing Skin:  No rashes no nodules Neuro:  CN II through XII intact, motor grossly intact  Lab Results:  Recent Labs  08/14/12 0545 08/15/12 0555  WBC 9.7 9.3  HGB 13.4 14.7  PLT 194 244    Recent Labs  08/14/12 1330 08/15/12 0555  NA 138 137  K 4.0 4.6  CL 101 104  CO2 25 24  GLUCOSE 97 131*  BUN 12 15  CREATININE 1.12 1.12   No results found for this basename: TROPONINI, CK, MB,  in the last 72 hours Hepatic Function Panel No results found for this basename: PROT, ALBUMIN, AST, ALT, ALKPHOS, BILITOT, BILIDIR, IBILI,  in the last 72 hours No results found for this basename: CHOL,  in the last 72 hours No results found for this basename: PROTIME,  in the last 72 hours  Imaging: Dg Chest 2 View (if Patient Has Fever And/or Copd)  08/13/2012  *RADIOLOGY REPORT*  Clinical Data: Shortness of breath.   History of atrial fibrillation.  CHEST - 2 VIEW  Comparison: Chest x-ray 11/19/2010.  Findings: No acute consolidative airspace disease.  No pleural effusions. There is cephalization of the pulmonary vasculature and slight indistinctness of the interstitial markings suggestive of mild pulmonary edema.  Mild cardiomegaly, and new compared to the prior examination.  Upper mediastinal contours are within normal limits.  IMPRESSION: 1.  New-onset of mild cardiomegaly, with findings suggestive of mild congestive heart failure, as above.  Clinical correlation is recommended.   Original Report Authenticated By: Trudie Reed, M.D.    Dg Chest Port 1 View  08/15/2012  *RADIOLOGY REPORT*  Clinical Data: Follow-up evaluation of pulmonary edema.  PORTABLE CHEST - 1 VIEW  Comparison: Chest x-ray 08/13/2012.  Findings: Moderate cardiomegaly has worsened.  No signs of pulmonary edema at this time.  No consolidative airspace disease or pleural effusions.  Upper mediastinal contours are within normal limits.  No pneumothorax.  IMPRESSION: 1.  Worsening moderate cardiomegaly.  No signs of edema at this time.   Original Report Authenticated By: Trudie Reed, M.D.     Cardiac Studies: Tele - atrial fib with a RVR Assessment/Plan:  1. Atrial fib with a RVR, refractory to IV amio (hypotension) and unable to cardiovert 2. Refractory systolic CHF, now improved Rec: Continue IV heparing, diuretics and oral amio loading. Will consider DCCV later in the week. I  would expect him to require approx 5-10 grams of amio prior to attempting repeat DCCV. Hopefully his HR will improve on oral amio and low dose beta blocker and digoxin.  LOS: 2 days    Gregg Taylor,M.D. 08/15/2012, 9:00 AM

## 2012-08-15 NOTE — Progress Notes (Signed)
Took pt. Off bipap for a break. Placed pt. On 2L [place]. Pt. Is tolerating well at this time.

## 2012-08-15 NOTE — Progress Notes (Signed)
PULMONARY  / CRITICAL CARE MEDICINE  Name: Lance [name] MRN: [id number] DOB: 29-Aug-1980    ADMISSION DATE:  08/13/2012 CONSULTATION DATE:  3/8  REFERRING MD :  Dr Clarise Cruz  CHIEF COMPLAINT:  SOB, acute resp failure, fib rvr  BRIEF PATIENT DESCRIPTION: 31 afib rvr, EF 25%, sudden shock after amio, resp failure  SIGNIFICANT EVENTS / STUDIES:  3/8- afib rvr, resp failure, amio, Hypotension, levophed  LINES / TUBES: PIV  CULTURES:  ANTIBIOTICS:  SUBJECTIVE: Pt reports he continues to have palpitations, mild shortness of breath but better with bipap.  RN reports pt off pressors.  No distress. Neg balance  VITAL SIGNS: Temp:  [96.5 F (35.8 C)-98.8 F (37.1 C)] 98 F (36.7 C) (03/10 0813) Pulse Rate:  [48-160] 60 (03/10 0800) Resp:  [16-22] 18 (03/10 0800) BP: (82-118)/(37-91) 118/87 mmHg (03/10 0800) SpO2:  [89 %-100 %] 100 % (03/10 0800) Arterial Line BP: (92-127)/(52-99) 103/99 mmHg (03/09 1400) FiO2 (%):  [30 %] 30 % (03/10 0500) Weight:  [124.2 kg (273 lb 13 oz)] 124.2 kg (273 lb 13 oz) (03/10 0458)  HEMODYNAMICS:   VENTILATOR SETTINGS: Vent Mode:  [-]  FiO2 (%):  [30 %] 30 %  INTAKE / OUTPUT: Intake/Output     03/09 0701 - 03/10 0700 03/10 0701 - 03/11 0700   P.O. 950 100   I.V. (mL/kg) 420 (3.4) 15 (0.1)   IV Piggyback 50    Total Intake(mL/kg) 1420 (11.4) 115 (0.9)   Urine (mL/kg/hr) 3025 (1)    Total Output 3025     Net -1605 +115         PHYSICAL EXAMINATION: General:  No resp distress no longer with NIMV Neuro:  Intact, nonfocal HEENT:  Obese, jvd Cardiovascular:  s1 s2, irir, distant tones, no edema Lungs:  coarse Abdomen:  Soft, BS wnl, no r/g Musculoskeletal:  No acute deformities Skin:  No rash  LABS:  Recent Labs Lab 08/13/12 0928 08/13/12 0929 08/13/12 0950 08/13/12 1807  08/14/12 0545 08/14/12 1330 08/15/12 0555  HGB 14.0  --  13.9  --   --  13.4  --  14.7  WBC 7.4  --   --   --   --  9.7  --  9.3  PLT 202  --   --   --   --   194  --  244  NA  --   --  140  --   < > 140 138 137  K  --   --  4.4  --   < > 4.0 4.0 4.6  CL  --   --  105  --   < > 105 101 104  CO2  --   --   --   --   < > 24 25 24   GLUCOSE  --   --  107*  --   < > 101* 97 131*  BUN  --   --  14  --   < > 13 12 15   CREATININE  --   --  1.20  --   < > 1.11 1.12 1.12  CALCIUM  --   --   --   --   < > 9.0 9.3 9.2  MG  --   --   --  1.8  --   --  2.8* 2.4  PHOS  --   --   --  3.1  --   --   --   --   PROBNP  --  2394.0*  --   --   --   --   --   --   < > = values in this interval not displayed. No results found for this basename: GLUCAP,  in the last 168 hours  CXR: 3/8 cardiomegaly, interstitial edema   ASSESSMENT / PLAN:  PULMONARY A:  Acute resp failure, near arrest, likely pulm edema from diastolic HF, PE?  P:   - Lasix per cardiology - Minimal pulmonary edema, will repeat CXR in AM. - Hold BiPAP for now and monitor with supplemental O2 alone changing BiPAP to QHS. - Continue lasix, tolerating well. - Noncompliant osa, cpap at home, BiPAP QHS.  CARDIOVASCULAR A:  Refractory Afib RVR, near arrest after amio, PE? Hyperthyroidism?storm?  P:  - Per cards. - Amio added as allergy but started PO amiodarone and will monitor for now. - ?cardioversion will defer to cardiology on the matter. - Doppler legs neg - Heparin - Diureses per cards, 40 PO daily as ordered.  RENAL A:   Rise in crt since 2012  P:   - Follow BMP - Lasix per Cardiology, changed to PO.  GASTROINTESTINAL A:   Near arrest, SUP proph  P:   - Heart healthy diet. - PPI.  HEMATOLOGIC A:   Afib / DVT proph  P:  - Heparin.  INFECTIOUS A:  No evidece infection P:   - BC if spike but hold of abx for now.  ENDOCRINE A:   R/o storm  P:   TSH 0.84 (WNL). Low threshold stress steroids, add cortisol to labs and pending.  NEUROLOGIC A:  Near arrest, neuro intact P:   Monitor  Alyson Reedy, M.D. [facility] Pulmonary/Critical Care Medicine. Pager:  [phone]. After hours pager: [phone].

## 2012-08-15 NOTE — Care Management Note (Addendum)
    Page 1 of 1   08/22/2012     1:10:17 PM   CARE MANAGEMENT NOTE 08/22/2012  Patient:  Lance [name], Lance [name]   Account Number:  [id number]  Date Initiated:  08/14/2012  Documentation initiated by:  Schoene,TYMEEKA  Subjective/Objective Assessment:   32 yo male admitted with cardiomyopathy. Pt currently on Bi-PAP, underwent cardioversion 08-14-12. Cm unable to assess needs. No payor. Pt referred to financial counselor Dina Rich @ [phone] for possible medicaid eligibilty.     Action/Plan:   Anticipated DC Date:     Anticipated DC Plan:    In-house referral  Financial Counselor      DC Planning Services  CM consult      Choice offered to / List presented to:             Status of service:  Completed, signed off Medicare Important Message given?   (If response is "NO", the following Medicare IM given date fields will be blank) Date Medicare IM given:   Date Additional Medicare IM given:    Discharge Disposition:  HOME/SELF CARE  Per UR Regulation:  Reviewed for med. necessity/level of care/duration of stay  If discussed at Long Length of Stay Meetings, dates discussed:    Comments:  08-22-12 1308 Tomi Bamberger, RN,BSN [phone] CM went to see pt due to new on xarelto and HF. Pt was gone once CM came to see. RN stated pt was in a hurry and was ready for d/c. Pt unable to wait. Cardiology office had 3wks sample of xarelto in office and pt was to pick up samples. No further needs from CM at this time.   3/10 1002 debbie dowell rn,bsn has been taken off tikosyn and placed on amiodarone. will moniter and assist as pt progresses.

## 2012-08-15 NOTE — Progress Notes (Signed)
Placed pt on bipap. Pt. Complaining on SOB. Pt. Is tolerating the bipap at this time.

## 2012-08-15 NOTE — Progress Notes (Signed)
Went in to talk to patient, currently on [facility]. Patient states he does not want to go on BIPAP states he does not need but at night. Let him know i was here today and if he felt like he needed me to reach out and let me know. I will continue to monitor the patient throughout the day

## 2012-08-15 NOTE — ED Provider Notes (Signed)
Medical screening examination/treatment/procedure(s) were conducted as a shared visit with non-physician practitioner(s) and myself.  I personally evaluated the patient during the encounter  Doug Sou, MD 08/15/12 [phone]

## 2012-08-15 NOTE — Progress Notes (Signed)
ANTICOAGULATION CONSULT NOTE - Follow Up Consult  Pharmacy Consult for heparin Indication: atrial fibrillation with RVR  Allergies  Allergen Reactions  . Amiodarone     Severe hypotension, shock after amiodarone bolus  . Metoprolol Succinate Other (See Comments)    REACTION: achy    Patient Measurements: Weight: 273 lb 13 oz (124.2 kg) IBW: 82.2kg Heparin Dosing Weight: 110kg  Vital Signs: Temp: 98 F (36.7 C) (03/10 0813) Temp src: Oral (03/10 0813) BP: 118/87 mmHg (03/10 0800) Pulse Rate: 60 (03/10 0800)  Labs:  Recent Labs  08/13/12 0928 08/13/12 0950  08/14/12 0545 08/14/12 1330 08/15/12 0555  HGB 14.0 13.9  --  13.4  --  14.7  HCT 40.1 41.0  --  38.7*  --  42.6  PLT 202  --   --  194  --  244  HEPARINUNFRC  --   --   --  0.47 0.66 0.57  CREATININE  --  1.20  < > 1.11 1.12 1.12  < > = values in this interval not displayed.  The CrCl is unknown because both a height and weight (above a minimum accepted value) are required for this calculation.   Assessment: 19 yoM admitted 3/8 in Afib w RVR and increased SOB.   Heparin per Pharmacy for PAF w RVR. HL in therapeutic range at 0.57 on 1500 units/hr. CBC stable, no bleeding noted. No DVT seen on dopplers 3/9  Current plans are to continue on heparin with possible re-attempt DCCV later in the week.   Goal of Therapy:  Heparin level 0.3-0.7 units/ml Monitor platelets by anticoagulation protocol: Yes   Plan:  - Continue heparin at 1500 units/hr - daily HL, CBC - F/u oral anticoagulation plans  Thank you,  Brett Fairy, PharmD, BCPS 08/15/2012 9:20 AM

## 2012-08-15 NOTE — Progress Notes (Signed)
Echocardiogram 2D Echocardiogram with Definity has been performed.  BROWN, CALEBA 08/15/2012, 1:16 PM

## 2012-08-16 ENCOUNTER — Inpatient Hospital Stay (HOSPITAL_COMMUNITY): Payer: Self-pay

## 2012-08-16 DIAGNOSIS — G4730 Sleep apnea, unspecified: Secondary | ICD-10-CM

## 2012-08-16 DIAGNOSIS — I5031 Acute diastolic (congestive) heart failure: Secondary | ICD-10-CM

## 2012-08-16 LAB — CBC
Hemoglobin: 14.9 g/dL (ref 13.0–17.0)
MCH: 31.2 pg (ref 26.0–34.0)
MCV: 89.5 fL (ref 78.0–100.0)
RBC: 4.77 MIL/uL (ref 4.22–5.81)

## 2012-08-16 LAB — MAGNESIUM: Magnesium: 2.2 mg/dL (ref 1.5–2.5)

## 2012-08-16 LAB — BASIC METABOLIC PANEL
BUN: 17 mg/dL (ref 6–23)
CO2: 24 mEq/L (ref 19–32)
Chloride: 102 mEq/L (ref 96–112)
Glucose, Bld: 94 mg/dL (ref 70–99)
Potassium: 4.5 mEq/L (ref 3.5–5.1)

## 2012-08-16 LAB — CORTISOL: Cortisol, Plasma: 36.1 ug/dL

## 2012-08-16 MED ORDER — BISACODYL 10 MG RE SUPP: 10.0000 mg | Freq: Every day | RECTAL | Status: DC | PRN

## 2012-08-16 NOTE — Progress Notes (Signed)
PULMONARY  / CRITICAL CARE MEDICINE  Name: Lance [name] MRN: [id number] DOB: 1980/10/22    ADMISSION DATE:  08/13/2012 CONSULTATION DATE:  3/8  REFERRING MD :  Dr Clarise Cruz  CHIEF COMPLAINT:  SOB, acute resp failure, fib rvr  BRIEF PATIENT DESCRIPTION: 31 afib rvr, EF 25%, sudden shock after amio, resp failure  SIGNIFICANT EVENTS / STUDIES:  3/8- afib rvr, resp failure, amio, Hypotension, levophed  LINES / TUBES: PIV  CULTURES:  ANTIBIOTICS:  SUBJECTIVE: Pt reports he continues to have palpitations, mild shortness of breath but better with bipap.  RN reports pt off pressors.  No distress. Neg balance  VITAL SIGNS: Temp:  [96.9 F (36.1 C)-98.5 F (36.9 C)] 98.5 F (36.9 C) (03/11 0800) Pulse Rate:  [68-130] 111 (03/11 0800) Resp:  [18-29] 29 (03/11 0321) BP: (78-113)/(45-88) 86/59 mmHg (03/11 0800) SpO2:  [97 %-100 %] 97 % (03/11 0800) FiO2 (%):  [30 %] 30 % (03/11 0500) Weight:  [124.7 kg (274 lb 14.6 oz)] 124.7 kg (274 lb 14.6 oz) (03/11 0409)  HEMODYNAMICS:   VENTILATOR SETTINGS: Vent Mode:  [-]  FiO2 (%):  [30 %] 30 %  INTAKE / OUTPUT: Intake/Output     03/10 0701 - 03/11 0700 03/11 0701 - 03/12 0700   P.O. 1140 480   I.V. (mL/kg) 420 (3.4) 20 (0.2)   IV Piggyback     Total Intake(mL/kg) 1560 (12.5) 500 (4)   Urine (mL/kg/hr) 950 (0.3)    Total Output 950     Net +610 +500         PHYSICAL EXAMINATION: General:  No resp distress no longer with NIMV. Neuro:  Intact, nonfocal. HEENT:  Obese, jvd. Cardiovascular:  s1 s2, irir, distant tones, no edema. Lungs:  Coarse. Abdomen:  Soft, BS wnl, no r/g. Musculoskeletal:  No acute deformities. Skin:  No rash.  LABS:  Recent Labs Lab 08/13/12 0929 08/13/12 0950  08/13/12 1807  08/14/12 0545 08/14/12 1330 08/15/12 0555 08/16/12 0618  HGB  --  13.9  --   --   --  13.4  --  14.7 14.9  WBC  --   --   --   --   --  9.7  --  9.3 10.1  PLT  --   --   --   --   --  194  --  244 228  NA  --  140  --    --   < > 140 138 137 137  K  --  4.4  --   --   < > 4.0 4.0 4.6 4.5  CL  --  105  --   --   < > 105 101 104 102  CO2  --   --   --   --   < > 24 25 24 24   GLUCOSE  --  107*  --   --   < > 101* 97 131* 94  BUN  --  14  --   --   < > 13 12 15 17   CREATININE  --  1.20  --   --   < > 1.11 1.12 1.12 1.24  CALCIUM  --   --   --   --   < > 9.0 9.3 9.2 9.2  MG  --   --   < > 1.8  --   --  2.8* 2.4 2.2  PHOS  --   --   --  3.1  --   --   --   --   --  PROBNP 2394.0*  --   --   --   --   --   --   --   --   < > = values in this interval not displayed. No results found for this basename: GLUCAP,  in the last 168 hours  CXR: 3/8 cardiomegaly, interstitial edema   ASSESSMENT / PLAN:  PULMONARY A:  Acute resp failure, near arrest, likely pulm edema from diastolic HF, PE?  P:   - Lasix per cardiology. - Minimal pulmonary edema. - Continue lasix, tolerating well. - Noncompliant osa, cpap at home, BiPAP QHS.  CARDIOVASCULAR A:  Refractory Afib RVR, near arrest after amio, PE? Hyperthyroidism?storm?  P:  - Per cards. - Amio added as allergy but started PO amiodarone and will monitor for now. - ?cardioversion will defer to cardiology on the matter. - Doppler legs neg - Heparin - Diureses per cards, 40 PO daily as ordered.  RENAL A:   Rise in crt since 2012  P:   - Follow BMP - Lasix per Cardiology, changed to PO.  GASTROINTESTINAL A:   Near arrest, SUP proph  P:   - Heart healthy diet. - PPI.  HEMATOLOGIC A:   Afib / DVT proph  P:  - Heparin.  INFECTIOUS A:  No evidece infection P:   - BC if spike but hold of abx for now.  ENDOCRINE A:   R/o storm  P:   TSH 0.84 (WNL). Low threshold stress steroids, add cortisol to labs and pending.  NEUROLOGIC A:  Near arrest, neuro intact P:   Monitor.  Alyson Reedy, M.D. [facility] Pulmonary/Critical Care Medicine. Pager: [phone]. After hours pager: [phone].

## 2012-08-16 NOTE — Progress Notes (Signed)
Pt c/o of sob w/ chesty tightness notified Dr Ronney Asters w/ order of chest xray.pt placed on bipap/rt w/ sat 100%.

## 2012-08-16 NOTE — Progress Notes (Signed)
Patient ID: Lance [name], male   DOB: January 07, 1981, 32 y.o.   MRN: [id number] Subjective:  Dyspnea improved, c/o abdominal distention and gas and no bm yesterday  Objective:  Vital Signs in the last 24 hours: Temp:  [96.9 F (36.1 C)-98.2 F (36.8 C)] 97.5 F (36.4 C) (03/11 0400) Pulse Rate:  [32-147] 117 (03/11 0603) Resp:  [18-29] 29 (03/11 0321) BP: (78-118)/(41-88) 96/73 mmHg (03/11 0603) SpO2:  [97 %-100 %] 99 % (03/11 0603) FiO2 (%):  [30 %] 30 % (03/11 0500) Weight:  [274 lb 14.6 oz (124.7 kg)] 274 lb 14.6 oz (124.7 kg) (03/11 0409)  Intake/Output from previous day: 03/10 0701 - 03/11 0700 In: 1540 [P.O.:1140; I.V.:400] Out: 950 [Urine:950] Intake/Output from this shift: Total I/O In: 360 [P.O.:200; I.V.:160] Out: 200 [Urine:200]  Physical Exam: Well appearing, not dyspneic, NAD HEENT: Unremarkable Neck:  7 cm JVD, no thyromegally Lungs:  Clear with no wheezes, minimal rales HEART:  IRegular tachy rhythm, no murmurs, no rubs, no clicks Abd:  soft, positive bowel sounds, no organomegally, no rebound, no guarding Ext:  2 plus pulses, no edema, no cyanosis, no clubbing Skin:  No rashes no nodules Neuro:  CN II through XII intact, motor grossly intact  Lab Results:  Recent Labs  08/14/12 0545 08/15/12 0555  WBC 9.7 9.3  HGB 13.4 14.7  PLT 194 244    Recent Labs  08/14/12 1330 08/15/12 0555  NA 138 137  K 4.0 4.6  CL 101 104  CO2 25 24  GLUCOSE 97 131*  BUN 12 15  CREATININE 1.12 1.12   No results found for this basename: TROPONINI, CK, MB,  in the last 72 hours Hepatic Function Panel No results found for this basename: PROT, ALBUMIN, AST, ALT, ALKPHOS, BILITOT, BILIDIR, IBILI,  in the last 72 hours No results found for this basename: CHOL,  in the last 72 hours No results found for this basename: PROTIME,  in the last 72 hours  Imaging: Dg Chest Port 1 View  08/16/2012  *RADIOLOGY REPORT*  Clinical Data: Shortness of breath  PORTABLE CHEST - 1  VIEW  Comparison: Portable exam 0013 hours compared to 08/15/2012  Findings: Enlargement of cardiac silhouette. Mediastinal contours and pulmonary vascularity normal. Lungs clear. No pleural effusion or pneumothorax. Bones unremarkable.  IMPRESSION: Enlargement of cardiac silhouette. No acute infiltrates.   Original Report Authenticated By: Ulyses Southward, M.D.    Dg Chest Port 1 View  08/15/2012  *RADIOLOGY REPORT*  Clinical Data: Follow-up evaluation of pulmonary edema.  PORTABLE CHEST - 1 VIEW  Comparison: Chest x-ray 08/13/2012.  Findings: Moderate cardiomegaly has worsened.  No signs of pulmonary edema at this time.  No consolidative airspace disease or pleural effusions.  Upper mediastinal contours are within normal limits.  No pneumothorax.  IMPRESSION: 1.  Worsening moderate cardiomegaly.  No signs of edema at this time.   Original Report Authenticated By: Trudie Reed, M.D.     Cardiac Studies: Tele - atrial fib with a RVR/cvr Assessment/Plan:  1. Atrial fib with a RVR, refractory to IV amio (hypotension) and unable to cardiovert 2. Refractory systolic CHF, now improved 3. Constipation - will watch for now and give a stool softner Rec: Continue IV heparin, diuretics and oral amio loading. Will consider DCCV later in the week, liely on Friday. I would expect him to require approx 5-10 grams of amio prior to attempting repeat DCCV. Hopefully his HR will improve on oral amio and low dose beta blocker and digoxin.  LOS: 3 days    Jocelin Schuelke,M.D. 08/16/2012, 6:45 AM

## 2012-08-16 NOTE — Progress Notes (Signed)
Took pt. Off bipap for a break. Placed pt. On 3L  nasal cannula.

## 2012-08-16 NOTE — Progress Notes (Signed)
ANTICOAGULATION CONSULT NOTE - Follow Up Consult  Pharmacy Consult for heparin Indication: atrial fibrillation with RVR  Allergies  Allergen Reactions  . Amiodarone     Severe hypotension, shock after amiodarone bolus  . Metoprolol Succinate Other (See Comments)    REACTION: achy    Patient Measurements: Weight: 274 lb 14.6 oz (124.7 kg) IBW: 82.2kg Heparin Dosing Weight: 110kg  Vital Signs: Temp: 98.5 F (36.9 C) (03/11 0800) Temp src: Oral (03/11 0800) BP: 86/59 mmHg (03/11 0800) Pulse Rate: 111 (03/11 0800)  Labs:  Recent Labs  08/14/12 0545 08/14/12 1330 08/15/12 0555 08/16/12 0618  HGB 13.4  --  14.7 14.9  HCT 38.7*  --  42.6 42.7  PLT 194  --  244 228  HEPARINUNFRC 0.47 0.66 0.57 0.44  CREATININE 1.11 1.12 1.12 1.24    The CrCl is unknown because both a height and weight (above a minimum accepted value) are required for this calculation.   Assessment: 61 yoM admitted 3/8 in Afib w RVR and increased SOB.   Heparin per Pharmacy for PAF w RVR. HL in therapeutic range at 0.44 on 1500 units/hr. CBC stable, no bleeding noted.   Current plans are to continue on heparin with possible re-attempt DCCV later in the week.   Goal of Therapy:  Heparin level 0.3-0.7 units/ml Monitor platelets by anticoagulation protocol: Yes   Plan:  - Continue heparin at 1500 units/hr - daily HL, CBC - F/u oral anticoagulation plans  Thank you,  Brett Fairy, PharmD, BCPS 08/16/2012 10:41 AM

## 2012-08-17 LAB — CBC
Hemoglobin: 14.8 g/dL (ref 13.0–17.0)
MCH: 31 pg (ref 26.0–34.0)
MCHC: 35 g/dL (ref 30.0–36.0)
MCV: 88.7 fL (ref 78.0–100.0)
RBC: 4.77 MIL/uL (ref 4.22–5.81)

## 2012-08-17 LAB — BASIC METABOLIC PANEL
BUN: 16 mg/dL (ref 6–23)
CO2: 24 mEq/L (ref 19–32)
Chloride: 101 mEq/L (ref 96–112)
Creatinine, Ser: 1.28 mg/dL (ref 0.50–1.35)
Glucose, Bld: 103 mg/dL — ABNORMAL HIGH (ref 70–99)

## 2012-08-17 MED ORDER — WARFARIN VIDEO: Freq: Once | Status: DC

## 2012-08-17 MED ORDER — WARFARIN SODIUM 5 MG PO TABS
5.0000 mg | ORAL_TABLET | Freq: Every day | ORAL | Status: AC
  Administered 2012-08-17: 5 mg via ORAL
  Filled 2012-08-17: qty 1

## 2012-08-17 MED ORDER — PATIENT'S GUIDE TO USING COUMADIN BOOK
Freq: Once | Status: AC
  Administered 2012-08-17: 18:00:00
  Filled 2012-08-17: qty 1

## 2012-08-17 MED ORDER — SODIUM CHLORIDE 0.9 % IV SOLN: INTRAVENOUS | Status: DC

## 2012-08-17 MED ORDER — WARFARIN - PHYSICIAN DOSING INPATIENT: Freq: Every day | Status: DC

## 2012-08-17 NOTE — Progress Notes (Signed)
ANTICOAGULATION CONSULT NOTE - Follow Up Consult  Pharmacy Consult for heparin Indication: atrial fibrillation with RVR  Allergies  Allergen Reactions  . Amiodarone     Severe hypotension, shock after amiodarone bolus  . Metoprolol Succinate Other (See Comments)    REACTION: achy    Patient Measurements: Weight: 276 lb 7.3 oz (125.4 kg) IBW: 82.2kg Heparin Dosing Weight: 110kg  Vital Signs: Temp: 98.2 F (36.8 C) (03/12 1137) Temp src: Oral (03/12 1137) BP: 132/71 mmHg (03/12 1000) Pulse Rate: 87 (03/12 0800)  Labs:  Recent Labs  08/15/12 0555 08/16/12 0618 08/17/12 0543  HGB 14.7 14.9 14.8  HCT 42.6 42.7 42.3  PLT 244 228 206  HEPARINUNFRC 0.57 0.44 0.57  CREATININE 1.12 1.24 1.28    The CrCl is unknown because both a height and weight (above a minimum accepted value) are required for this calculation.   Assessment: 18 yoM admitted 3/8 in Afib w RVR and increased SOB.   Heparin per Pharmacy for PAF w RVR. HL remains in therapeutic range at 0.57 on 1500 units/hr. CBC stable, no bleeding noted. Coumadin to start today per MD at 5 mg daily. Getting PO amiodarone load. DCCV planned for 3/14.   Goal of Therapy:  Heparin level 0.3-0.7 units/ml Monitor platelets by anticoagulation protocol: Yes   Plan:  - Continue heparin at 1500 units/hr - daily HL, CBC, - coumadin 5 mg po daily per MD - I ordered daily INR, coumadin book and coumadin video  Thank you,  Herby Abraham, Pharm.D. [phone] 08/17/2012 11:46 AM

## 2012-08-17 NOTE — Progress Notes (Signed)
Pt transferred to 2900 SD. Tolerated transport well, stated no needs at this time. VSS.

## 2012-08-17 NOTE — Progress Notes (Signed)
Patient ID: Lance [name], male   DOB: 07-Sep-1980, 32 y.o.   MRN: [id number] Subjective:  Dyspnea stable. Got out of bed yesterday  Objective:  Vital Signs in the last 24 hours: Temp:  [97.5 F (36.4 C)-99.3 F (37.4 C)] 99 F (37.2 C) (03/12 0400) Pulse Rate:  [16-130] 84 (03/12 0400) BP: (71-132)/(52-91) 95/74 mmHg (03/12 0400) SpO2:  [95 %-100 %] 100 % (03/12 0400) Weight:  [276 lb 7.3 oz (125.4 kg)] 276 lb 7.3 oz (125.4 kg) (03/12 0500)  Intake/Output from previous day: 03/11 0701 - 03/12 0700 In: 1030 [P.O.:770; I.V.:260] Out: 2100 [Urine:2100] Intake/Output from this shift:    Physical Exam: Well appearing NAD HEENT: Unremarkable Neck:  7 cm JVD, no thyromegally Lungs:  Clear with no wheezes, rales, or rhonchi HEART:  Regular rate rhythm, no murmurs, no rubs, no clicks Abd:  Flat, positive bowel sounds, no organomegally, no rebound, no guarding Ext:  2 plus pulses, no edema, no cyanosis, no clubbing Skin:  No rashes no nodules Neuro:  CN II through XII intact, motor grossly intact  Lab Results:  Recent Labs  08/16/12 0618 08/17/12 0543  WBC 10.1 9.7  HGB 14.9 14.8  PLT 228 206    Recent Labs  08/16/12 0618 08/17/12 0543  NA 137 137  K 4.5 4.5  CL 102 101  CO2 24 24  GLUCOSE 94 103*  BUN 17 16  CREATININE 1.24 1.28   No results found for this basename: TROPONINI, CK, MB,  in the last 72 hours Hepatic Function Panel No results found for this basename: PROT, ALBUMIN, AST, ALT, ALKPHOS, BILITOT, BILIDIR, IBILI,  in the last 72 hours No results found for this basename: CHOL,  in the last 72 hours No results found for this basename: PROTIME,  in the last 72 hours  Imaging: Dg Chest Port 1 View  08/16/2012  *RADIOLOGY REPORT*  Clinical Data: Shortness of breath  PORTABLE CHEST - 1 VIEW  Comparison: Portable exam 0013 hours compared to 08/15/2012  Findings: Enlargement of cardiac silhouette. Mediastinal contours and pulmonary vascularity normal. Lungs  clear. No pleural effusion or pneumothorax. Bones unremarkable.  IMPRESSION: Enlargement of cardiac silhouette. No acute infiltrates.   Original Report Authenticated By: Ulyses Southward, M.D.     Cardiac Studies: Tele - atrial fib with a controlled VR Assessment/Plan:  1. Atrial fib with a RVR 2. Acute systolic chf 3. HTN 4. Cardiogenic shock after IV amio - now resolved Rec: Will continue oral amio loading, continue diuretic, continue CHF meds, advance activity, and plan for dccv on 3/14. Will transfer to step down today.  LOS: 4 days    Gregg Taylor,M.D. 08/17/2012, 7:44 AM

## 2012-08-17 NOTE — Progress Notes (Signed)
PULMONARY  / CRITICAL CARE MEDICINE  Name: Lance [name] MRN: [id number] DOB: 06/25/1980    ADMISSION DATE:  08/13/2012 CONSULTATION DATE:  3/8  REFERRING MD :  Dr Clarise Cruz  CHIEF COMPLAINT:  SOB, acute resp failure, fib rvr  BRIEF PATIENT DESCRIPTION: 31 afib rvr, EF 25%, sudden shock after amio, resp failure  SIGNIFICANT EVENTS / STUDIES:  3/8- afib rvr, resp failure, amio, Hypotension, levophed  LINES / TUBES: PIV  CULTURES:  ANTIBIOTICS:  SUBJECTIVE: Pt reports he continues to have palpitations, mild shortness of breath but better with bipap.  RN reports pt off pressors.  No distress. Neg balance  VITAL SIGNS: Temp:  [97.5 F (36.4 C)-99.3 F (37.4 C)] 98.4 F (36.9 C) (03/12 0800) Pulse Rate:  [16-122] 84 (03/12 0400) BP: (71-132)/(55-91) 132/71 mmHg (03/12 1000) SpO2:  [95 %-100 %] 100 % (03/12 0400) Weight:  [125.4 kg (276 lb 7.3 oz)] 125.4 kg (276 lb 7.3 oz) (03/12 0500)  HEMODYNAMICS:   VENTILATOR SETTINGS:    INTAKE / OUTPUT: Intake/Output     03/11 0701 - 03/12 0700 03/12 0701 - 03/13 0700   P.O. 770    I.V. (mL/kg) 260 (2.1)    Total Intake(mL/kg) 1030 (8.2)    Urine (mL/kg/hr) 2100 (0.7)    Total Output 2100     Net -1070          Urine Occurrence 1 x    Stool Occurrence 1 x     PHYSICAL EXAMINATION: General:  No resp distress no longer with NIMV. Neuro:  Intact, nonfocal. HEENT:  Obese, jvd. Cardiovascular:  s1 s2, irir, distant tones, no edema. Lungs:  Coarse. Abdomen:  Soft, BS wnl, no r/g. Musculoskeletal:  No acute deformities. Skin:  No rash.  LABS:  Recent Labs Lab 08/13/12 0929 08/13/12 0950 08/13/12 1807  08/15/12 0555 08/16/12 0618 08/17/12 0543  HGB  --  13.9  --   < > 14.7 14.9 14.8  WBC  --   --   --   < > 9.3 10.1 9.7  PLT  --   --   --   < > 244 228 206  NA  --  140  --   < > 137 137 137  K  --  4.4  --   < > 4.6 4.5 4.5  CL  --  105  --   < > 104 102 101  CO2  --   --   --   < > 24 24 24   GLUCOSE  --  107*   --   < > 131* 94 103*  BUN  --  14  --   < > 15 17 16   CREATININE  --  1.20  --   < > 1.12 1.24 1.28  CALCIUM  --   --   --   < > 9.2 9.2 9.2  MG  --   --  1.8  < > 2.4 2.2 2.2  PHOS  --   --  3.1  --   --   --   --   PROBNP 2394.0*  --   --   --   --   --   --   < > = values in this interval not displayed. No results found for this basename: GLUCAP,  in the last 168 hours  CXR: 3/8 cardiomegaly, interstitial edema   ASSESSMENT / PLAN:  PULMONARY A:  Acute resp failure, near arrest, likely pulm edema from diastolic HF, PE?  P:   - Lasix per cardiology. - Minimal pulmonary edema and improving. - Continue lasix, tolerating well. - Noncompliant osa, cpap at home, BiPAP QHS.  CARDIOVASCULAR A:  Refractory Afib RVR, near arrest after amio, PE? Hyperthyroidism?storm?  P:  - Per cards. - Amio added as allergy but started PO amiodarone and will monitor for now. - ?cardioversion will defer to cardiology on the matter. - Doppler legs neg. - Heparin. - Diureses per cards, 40 PO daily as ordered.  RENAL A:   Rise in crt since 2012  P:   - Follow BMP - Lasix per Cardiology, changed to PO.  GASTROINTESTINAL A:   Near arrest, SUP proph  P:   - Heart healthy diet. - PPI.  HEMATOLOGIC A:   Afib / DVT proph  P:  - Heparin.  INFECTIOUS A:  No evidece infection P:   - BC if spike but hold of abx for now.  ENDOCRINE A:   R/o storm  P:   TSH 0.84 (WNL). Low threshold stress steroids, add cortisol to labs and pending.  NEUROLOGIC A:  Near arrest, neuro intact P:   Monitor.  Alyson Reedy, M.D. [facility] Pulmonary/Critical Care Medicine. Pager: [phone]. After hours pager: [phone].

## 2012-08-18 DIAGNOSIS — I5021 Acute systolic (congestive) heart failure: Secondary | ICD-10-CM

## 2012-08-18 DIAGNOSIS — I4891 Unspecified atrial fibrillation: Secondary | ICD-10-CM

## 2012-08-18 LAB — BASIC METABOLIC PANEL
CO2: 25 mEq/L (ref 19–32)
Chloride: 102 mEq/L (ref 96–112)
Creatinine, Ser: 1.31 mg/dL (ref 0.50–1.35)
Glucose, Bld: 94 mg/dL (ref 70–99)

## 2012-08-18 LAB — CBC
HCT: 44.2 % (ref 39.0–52.0)
Hemoglobin: 15.5 g/dL (ref 13.0–17.0)
MCH: 31.5 pg (ref 26.0–34.0)
MCHC: 35.1 g/dL (ref 30.0–36.0)
MCV: 89.8 fL (ref 78.0–100.0)

## 2012-08-18 LAB — PROTIME-INR: INR: 1.07 (ref 0.00–1.49)

## 2012-08-18 LAB — HEPARIN LEVEL (UNFRACTIONATED): Heparin Unfractionated: 0.27 IU/mL — ABNORMAL LOW (ref 0.30–0.70)

## 2012-08-18 MED ORDER — WARFARIN VIDEO: Freq: Once | Status: DC

## 2012-08-18 MED ORDER — WARFARIN SODIUM 5 MG PO TABS
5.0000 mg | ORAL_TABLET | ORAL | Status: AC
  Administered 2012-08-18: 5 mg via ORAL
  Filled 2012-08-18: qty 1

## 2012-08-18 NOTE — Progress Notes (Addendum)
ANTICOAGULATION CONSULT NOTE - Follow Up Consult  Pharmacy Consult for heparin, coumadin per MD Indication: atrial fibrillation with RVR  Allergies  Allergen Reactions  . Amiodarone     Severe hypotension, shock after amiodarone bolus  . Metoprolol Succinate Other (See Comments)    REACTION: achy    Patient Measurements: Weight: 273 lb 13 oz (124.2 kg) IBW: 82.2kg Heparin Dosing Weight: 110kg  Vital Signs: Temp: 97.4 F (36.3 C) (03/13 1200) Temp src: Oral (03/13 1200) BP: 137/86 mmHg (03/13 1200) Pulse Rate: 112 (03/13 1200)  Labs:  Recent Labs  08/16/12 0618 08/17/12 0543 08/18/12 0545 08/18/12 1250  HGB 14.9 14.8 15.5  --   HCT 42.7 42.3 44.2  --   PLT 228 206 220  --   LABPROT  --   --  13.8  --   INR  --   --  1.07  --   HEPARINUNFRC 0.44 0.57 0.23* 0.27*  CREATININE 1.24 1.28 1.31  --     The CrCl is unknown because both a height and weight (above a minimum accepted value) are required for this calculation.   Assessment: 3 yoM admitted 3/8 in Afib w RVR and increased SOB.   Heparin per Pharmacy for PAF w RVR. HL 0.27 after reate increased to 1700 units/hr. On coumadin day #2 per MD. On coumadin 5 mg daily. Getting PO amiodarone load. DCCV planned for 3/14.  CBC stable, no bleeding reported.  Goal of Therapy:  Heparin level 0.3-0.7 units/ml Monitor platelets by anticoagulation protocol: Yes   Plan:  - increase heparin to 1900 units/hr -check 6 hr HL b/c need HL tx for TEE guided DCCV 3/14. - daily HL, CBC, INR - coumadin 5 mg po daily per MD - coumadin book given to pt 3/12, he defered coumadin video to today.  Thank you,  Herby Abraham, Pharm.D. [phone] 08/18/2012 1:49 PM  New order for coumadin per pharmacy, given significant drug interaction with high dose amiodarone, will repeat coumadin 5mg  today and f/u daily protime.   Verlene Mayer, PharmD, BCPS Pager [phone]

## 2012-08-18 NOTE — Progress Notes (Signed)
Patient: HIREN PEPLINSKI Date of Encounter: 08/18/2012, 7:40 AM Admit date: 08/13/2012     Subjective  Mr. Otting has no new complaints. He denies CP, SOB or palpitations.   Objective  Physical Exam: Vitals: BP 123/83  Pulse 96  Temp(Src) 98.5 F (36.9 C) (Oral)  Resp 23  Wt 273 lb 13 oz (124.2 kg)  BMI 35.14 kg/m2  SpO2 100% General: Well developed, well appearing 32 year old male in no acute distress. Neck: Supple. JVD not elevated. Lungs: Clear bilaterally to auscultation without wheezes, rales, or rhonchi. Breathing is unlabored. Heart: Irregular S1 S2 without murmurs, rubs, or gallops.  Abdomen: Soft, non-distended. Extremities: No clubbing or cyanosis. No edema.  Distal pedal pulses are 2+ and equal bilaterally. Neuro: Alert and oriented X 3. Moves all extremities spontaneously. No focal deficits.  Intake/Output:  Intake/Output Summary (Last 24 hours) at 08/18/12 0740 Last data filed at 08/18/12 0700  Gross per 24 hour  Intake 2140.17 ml  Output   1725 ml  Net 415.17 ml    Inpatient Medications:  . amiodarone  400 mg Oral Q8H  . digoxin  0.25 mg Oral Daily  . furosemide  40 mg Oral Daily  . metoprolol succinate  50 mg Oral BID  . pantoprazole  40 mg Oral Daily  . potassium chloride  20 mEq Oral BID  . warfarin   Does not apply Once  . Warfarin - Physician Dosing Inpatient   Does not apply q1800   . sodium chloride 5 mL/hr at 08/17/12 1300  . heparin 1,700 Units/hr (08/18/12 0655)    Labs:  Recent Labs  08/16/12 0618 08/17/12 0543 08/18/12 0545  NA 137 137 138  K 4.5 4.5 4.5  CL 102 101 102  CO2 24 24 25   GLUCOSE 94 103* 94  BUN 17 16 15   CREATININE 1.24 1.28 1.31  CALCIUM 9.2 9.2 9.4  MG 2.2 2.2  --     Recent Labs  08/17/12 0543 08/18/12 0545  WBC 9.7 9.2  HGB 14.8 15.5  HCT 42.3 44.2  MCV 88.7 89.8  PLT 206 220    Recent Labs  08/18/12 0545  INR 1.07    Radiology/Studies: Dg Chest Port 1 View  08/16/2012  *RADIOLOGY  REPORT*  Clinical Data: Shortness of breath  PORTABLE CHEST - 1 VIEW  Comparison: Portable exam 0013 hours compared to 08/15/2012  Findings: Enlargement of cardiac silhouette. Mediastinal contours and pulmonary vascularity normal. Lungs clear. No pleural effusion or pneumothorax. Bones unremarkable.  IMPRESSION: Enlargement of cardiac silhouette. No acute infiltrates.   Original Report Authenticated By: Ulyses Southward, M.D.    Dg Chest Port 1 View  08/15/2012  *RADIOLOGY REPORT*  Clinical Data: Follow-up evaluation of pulmonary edema.  PORTABLE CHEST - 1 VIEW  Comparison: Chest x-ray 08/13/2012.  Findings: Moderate cardiomegaly has worsened.  No signs of pulmonary edema at this time.  No consolidative airspace disease or pleural effusions.  Upper mediastinal contours are within normal limits.  No pneumothorax.  IMPRESSION: 1.  Worsening moderate cardiomegaly.  No signs of edema at this time.   Original Report Authenticated By: Trudie Reed, M.D.     Telemetry: atrial fibrillation in the 90s    Assessment and Plan  1. Atrial fibrillation with a RVR  2. Acute systolic HF  3. HTN  4. 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 Developed cardiogenic shock after IV amiodarone. Now on PO amiodarone for rate control. BB and digoxin added. On warfarin and IV heparin. Plan for TEE-guided DCCV on 08/19/2012.  Dr. Johney Frame to see. Signed, EDMISTEN, BROOKE PA-C  I have seen, examined the patient, and reviewed the above assessment and plan.  Changes to above are made where necessary.    Co Sign: Hillis Range, MD

## 2012-08-18 NOTE — Progress Notes (Signed)
ANTICOAGULATION CONSULT NOTE - Follow Up Consult  Pharmacy Consult for heparin Indication: atrial fibrillation with RVR  Allergies  Allergen Reactions  . Amiodarone     Severe hypotension, shock after amiodarone bolus  . Metoprolol Succinate Other (See Comments)    REACTION: achy    Patient Measurements: Weight: 273 lb 13 oz (124.2 kg) IBW: 82.2kg Heparin Dosing Weight: 110kg  Vital Signs: Temp: 98.5 F (36.9 C) (03/13 0406) Temp src: Oral (03/13 0406) BP: 123/83 mmHg (03/13 0424) Pulse Rate: 96 (03/13 0424)  Labs:  Recent Labs  08/16/12 0618 08/17/12 0543 08/18/12 0545  HGB 14.9 14.8 15.5  HCT 42.7 42.3 44.2  PLT 228 206 220  LABPROT  --   --  13.8  INR  --   --  1.07  HEPARINUNFRC 0.44 0.57 0.23*  CREATININE 1.24 1.28  --     The CrCl is unknown because both a height and weight (above a minimum accepted value) are required for this calculation.   Assessment: 27 yoM admitted 3/8 in Afib w RVR and increased SOB.   Heparin per Pharmacy for PAF w RVR. HL has decreased to 0.23 this am--no bleeding noted.   Goal of Therapy:  Heparin level 0.3-0.7 units/ml Monitor platelets by anticoagulation protocol: Yes   Plan:  - increase heparin to 1700 units /hr Recheck HL in 6 hours  Janice Coffin

## 2012-08-18 NOTE — Progress Notes (Signed)
PULMONARY  / CRITICAL CARE MEDICINE  Name: Lance [name] MRN: [id number] DOB: 09/02/1980    ADMISSION DATE:  08/13/2012 CONSULTATION DATE:  3/8  REFERRING MD :  Dr Clarise Cruz  CHIEF COMPLAINT:  SOB, acute resp failure, fib rvr  BRIEF PATIENT DESCRIPTION: 31 afib rvr, EF 25%, sudden shock after amio, resp failure  SIGNIFICANT EVENTS / STUDIES:  3/8- afib rvr, resp failure, amio, Hypotension, levophed  LINES / TUBES: PIV  CULTURES:  ANTIBIOTICS:  SUBJECTIVE: Pt reports he continues to have palpitations, mild shortness of breath but better with bipap.  RN reports pt off pressors.  No distress. Neg balance  VITAL SIGNS: Temp:  [97.7 F (36.5 C)-98.6 F (37 C)] 97.7 F (36.5 C) (03/13 0800) Pulse Rate:  [88-101] 100 (03/13 0800) Resp:  [18-23] 23 (03/13 0424) BP: (95-132)/(49-91) 125/91 mmHg (03/13 0800) SpO2:  [97 %-100 %] 97 % (03/13 0800) FiO2 (%):  [30 %] 30 % (03/13 0424) Weight:  [124.2 kg (273 lb 13 oz)] 124.2 kg (273 lb 13 oz) (03/13 0500)  HEMODYNAMICS:   VENTILATOR SETTINGS: Vent Mode:  [-]  FiO2 (%):  [30 %] 30 %  INTAKE / OUTPUT: Intake/Output     03/12 0701 - 03/13 0700 03/13 0701 - 03/14 0700   P.O. 1440    I.V. (mL/kg) 700.2 (5.6) 22 (0.2)   Total Intake(mL/kg) 2140.2 (17.2) 22 (0.2)   Urine (mL/kg/hr) 1725 (0.6) 600 (1.9)   Total Output 1725 600   Net +415.2 -578        Stool Occurrence 1 x     PHYSICAL EXAMINATION: General:  No resp distress no longer with NIMV. Neuro:  Intact, nonfocal. HEENT:  Obese, jvd. Cardiovascular:  s1 s2, irir, distant tones, no edema. Lungs:  Coarse. Abdomen:  Soft, BS wnl, no r/g. Musculoskeletal:  No acute deformities. Skin:  No rash.  LABS:  Recent Labs Lab 08/13/12 0929 08/13/12 0950 08/13/12 1807  08/15/12 0555 08/16/12 0618 08/17/12 0543 08/18/12 0545  HGB  --  13.9  --   < > 14.7 14.9 14.8 15.5  WBC  --   --   --   < > 9.3 10.1 9.7 9.2  PLT  --   --   --   < > 244 228 206 220  NA  --  140  --    < > 137 137 137 138  K  --  4.4  --   < > 4.6 4.5 4.5 4.5  CL  --  105  --   < > 104 102 101 102  CO2  --   --   --   < > 24 24 24 25   GLUCOSE  --  107*  --   < > 131* 94 103* 94  BUN  --  14  --   < > 15 17 16 15   CREATININE  --  1.20  --   < > 1.12 1.24 1.28 1.31  CALCIUM  --   --   --   < > 9.2 9.2 9.2 9.4  MG  --   --  1.8  < > 2.4 2.2 2.2  --   PHOS  --   --  3.1  --   --   --   --   --   INR  --   --   --   --   --   --   --  1.07  PROBNP 2394.0*  --   --   --   --   --   --   --   < > =  values in this interval not displayed. No results found for this basename: GLUCAP,  in the last 168 hours  CXR: 3/8 cardiomegaly, interstitial edema   ASSESSMENT / PLAN:  PULMONARY A:  Acute resp failure, near arrest, likely pulm edema from diastolic HF, PE?  P:   - Lasix per cardiology. - Minimal pulmonary edema and improving. - Continue lasix, tolerating well. - Noncompliant osa, cpap at home, BiPAP QHS.  CARDIOVASCULAR A:  Refractory Afib RVR, near arrest after amio, PE? Hyperthyroidism?storm?  P:  - Per cards. - Tolerating PO amiodarone, no IV however given previous events. - Cardioversion in AM per cards with anesthesia's assistance. - Doppler legs neg. - Heparin. - Diureses per cards, 40 PO daily as ordered.  RENAL A:   Rise in crt since 2012  P:   - Follow BMP - Lasix per Cardiology, changed to PO.  GASTROINTESTINAL A:   Near arrest, SUP proph  P:   - Heart healthy diet. - PPI.  HEMATOLOGIC A:   Afib / DVT proph  P:  - Heparin.  INFECTIOUS A:  No evidece infection P:   - BC if spike but hold of abx for now.  ENDOCRINE A:   R/o storm  P:   TSH 0.84 (WNL). Low threshold stress steroids, add cortisol to labs and pending.  NEUROLOGIC A:  Near arrest, neuro intact P:   Monitor.  Cardioversion in AM, will stay on stand by from a pulmonary standpoint until after cardioversion but would continue QHS BiPAP in the meantime.  Alyson Reedy,  M.D. [facility] Pulmonary/Critical Care Medicine. Pager: [phone]. After hours pager: [phone].

## 2012-08-18 NOTE — Progress Notes (Signed)
ANTICOAGULATION CONSULT NOTE - Follow Up Consult  Pharmacy Consult for heparin Indication: atrial fibrillation with RVR  Allergies  Allergen Reactions  . Amiodarone     Severe hypotension, shock after amiodarone bolus  . Metoprolol Succinate Other (See Comments)    REACTION: achy    Labs:  Recent Labs  08/16/12 0618 08/17/12 0543 08/18/12 0545 08/18/12 1250 08/18/12 2116  HGB 14.9 14.8 15.5  --   --   HCT 42.7 42.3 44.2  --   --   PLT 228 206 220  --   --   LABPROT  --   --  13.8  --   --   INR  --   --  1.07  --   --   HEPARINUNFRC 0.44 0.57 0.23* 0.27* 0.31  CREATININE 1.24 1.28 1.31  --   --     The CrCl is unknown because both a height and weight (above a minimum accepted value) are required for this calculation.   Assessment: 1 yoM admitted 3/8 in Afib w RVR and increased SOB.   Heparin level therapeutic at 0.31  Goal of Therapy:  Heparin level 0.3-0.7 units/ml Monitor platelets by anticoagulation protocol: Yes   Plan:  Continue heparin at 1900 units / hr Follow up AM labs  Thank you. Okey Regal, PharmD

## 2012-08-19 ENCOUNTER — Inpatient Hospital Stay (HOSPITAL_COMMUNITY): Payer: Self-pay | Admitting: Certified Registered Nurse Anesthetist

## 2012-08-19 ENCOUNTER — Encounter (HOSPITAL_COMMUNITY)
Admission: EM | Disposition: A | Discharge status: Home / Self Care | Payer: Self-pay | Source: Home / Self Care | Attending: Internal Medicine

## 2012-08-19 ENCOUNTER — Encounter (HOSPITAL_COMMUNITY): Payer: Self-pay | Admitting: Certified Registered Nurse Anesthetist

## 2012-08-19 DIAGNOSIS — G4733 Obstructive sleep apnea (adult) (pediatric): Secondary | ICD-10-CM

## 2012-08-19 DIAGNOSIS — I4891 Unspecified atrial fibrillation: Secondary | ICD-10-CM

## 2012-08-19 LAB — CBC
MCHC: 35.5 g/dL (ref 30.0–36.0)
RDW: 13.3 % (ref 11.5–15.5)

## 2012-08-19 LAB — PROTIME-INR
INR: 1.08 (ref 0.00–1.49)
Prothrombin Time: 13.9 seconds (ref 11.6–15.2)

## 2012-08-19 LAB — HEPARIN LEVEL (UNFRACTIONATED): Heparin Unfractionated: 0.43 IU/mL (ref 0.30–0.70)

## 2012-08-19 SURGERY — CARDIOVERSION
Anesthesia: Monitor Anesthesia Care

## 2012-08-19 MED ORDER — WARFARIN SODIUM 7.5 MG PO TABS
7.5000 mg | ORAL_TABLET | Freq: Once | ORAL | Status: AC
  Administered 2012-08-19: 7.5 mg via ORAL
  Filled 2012-08-19: qty 1

## 2012-08-19 MED ORDER — RAMIPRIL 2.5 MG PO CAPS
2.5000 mg | ORAL_CAPSULE | Freq: Every day | ORAL | Status: DC
  Administered 2012-08-19 – 2012-08-22 (×4): 2.5 mg via ORAL
  Filled 2012-08-19 (×6): qty 1

## 2012-08-19 MED ORDER — LIDOCAINE HCL (CARDIAC) 20 MG/ML IV SOLN
INTRAVENOUS | Status: DC | PRN
  Administered 2012-08-19: 30 mg via INTRAVENOUS

## 2012-08-19 MED ORDER — SODIUM CHLORIDE 0.9 % IV SOLN
INTRAVENOUS | Status: DC | PRN
  Administered 2012-08-19: 11:00:00 via INTRAVENOUS

## 2012-08-19 MED ORDER — WARFARIN - PHARMACIST DOSING INPATIENT: Freq: Every day | Status: DC

## 2012-08-19 MED ORDER — WARFARIN VIDEO: Freq: Once | Status: DC

## 2012-08-19 MED ORDER — PROPOFOL 10 MG/ML IV BOLUS
INTRAVENOUS | Status: DC | PRN
  Administered 2012-08-19: 80 mg via INTRAVENOUS

## 2012-08-19 NOTE — Preoperative (Signed)
Beta Blockers   Reason not to administer Beta Blockers:Not Applicable 

## 2012-08-19 NOTE — Anesthesia Preprocedure Evaluation (Addendum)
Anesthesia Evaluation  Patient identified by MRN, date of birth, ID band  Reviewed: Allergy & Precautions, H&P , NPO status , Patient's Chart, lab work & pertinent test results  History of Anesthesia Complications Negative for: history of anesthetic complications  Airway Mallampati: II TM Distance: >3 FB Neck ROM: Full    Dental  (+) Dental Advisory Given   Pulmonary sleep apnea and Continuous Positive Airway Pressure Ventilation , Current Smoker,          Cardiovascular + Peripheral Vascular Disease + dysrhythmias Atrial Fibrillation Rhythm:Irregular  Ef 10-15%. CHF, Systolic dysfunction.   Neuro/Psych negative neurological ROS  negative psych ROS   GI/Hepatic negative GI ROS, Neg liver ROS,   Endo/Other  negative endocrine ROS  Renal/GU negative Renal ROS     Musculoskeletal negative musculoskeletal ROS (+)   Abdominal   Peds  Hematology negative hematology ROS (+)   Anesthesia Other Findings ETOH  Reproductive/Obstetrics                         Anesthesia Physical Anesthesia Plan  ASA: II  Anesthesia Plan: MAC   Post-op Pain Management:    Induction: Intravenous  Airway Management Planned: Simple Face Mask  Additional Equipment:   Intra-op Plan:   Post-operative Plan:   Informed Consent: I have reviewed the patients History and Physical, chart, labs and discussed the procedure including the risks, benefits and alternatives for the proposed anesthesia with the patient or authorized representative who has indicated his/her understanding and acceptance.   Dental advisory given  Plan Discussed with: CRNA, Anesthesiologist and Surgeon  Anesthesia Plan Comments:         Anesthesia Quick Evaluation

## 2012-08-19 NOTE — Anesthesia Postprocedure Evaluation (Signed)
  Anesthesia Post-op Note  Patient: Lance [name]  Procedure(s) Performed: * No procedures listed *  Patient Location: PACU and Nursing Unit  Anesthesia Type:MAC  Level of Consciousness: awake, alert  and oriented  Airway and Oxygen Therapy: Patient Spontanous Breathing and Patient connected to nasal cannula oxygen  Post-op Pain: none  Post-op Assessment: Post-op Vital signs reviewed, Patient's Cardiovascular Status Stable, Respiratory Function Stable, Patent Airway and No signs of Nausea or vomiting  Post-op Vital Signs: Reviewed and stable  Complications: No apparent anesthesia complications

## 2012-08-19 NOTE — Progress Notes (Signed)
Patient ID: Lance [name], male   DOB: 10-24-1980, 32 y.o.   MRN: [id number] Subjective:  Maintaining NSR s/p DCCV, and feeling well. No chest pain or sob.  Objective:  Vital Signs in the last 24 hours: Temp:  [97.3 F (36.3 C)-98.8 F (37.1 C)] 97.3 F (36.3 C) (03/14 1200) Pulse Rate:  [80-109] 80 (03/14 1200) Resp:  [18-21] 18 (03/14 1200) BP: (108-124)/(69-97) 108/69 mmHg (03/14 1200) SpO2:  [94 %-100 %] 100 % (03/14 1200) FiO2 (%):  [30 %] 30 % (03/14 0115) Weight:  [267 lb 6.7 oz (121.3 kg)] 267 lb 6.7 oz (121.3 kg) (03/14 0800)  Intake/Output from previous day: 03/13 0701 - 03/14 0700 In: 1420 [P.O.:900; I.V.:520] Out: 3400 [Urine:3400] Intake/Output from this shift: Total I/O In: 416 [P.O.:40; I.V.:376] Out: 1375 [Urine:1375]  Physical Exam: Well appearing NAD HEENT: Unremarkable Neck:  No JVD, no thyromegally Lungs:  Clear with no wheezes HEART:  Regular rate rhythm, no murmurs, no rubs, no clicks Abd:  Flat, positive bowel sounds, no organomegally, no rebound, no guarding Ext:  2 plus pulses, no edema, no cyanosis, no clubbing Skin:  No rashes no nodules Neuro:  CN II through XII intact, motor grossly intact  Lab Results:  Recent Labs  08/18/12 0545 08/19/12 0410  WBC 9.2 9.7  HGB 15.5 15.7  PLT 220 236    Recent Labs  08/17/12 0543 08/18/12 0545  NA 137 138  K 4.5 4.5  CL 101 102  CO2 24 25  GLUCOSE 103* 94  BUN 16 15  CREATININE 1.28 1.31   No results found for this basename: TROPONINI, CK, MB,  in the last 72 hours Hepatic Function Panel No results found for this basename: PROT, ALBUMIN, AST, ALT, ALKPHOS, BILITOT, BILIDIR, IBILI,  in the last 72 hours No results found for this basename: CHOL,  in the last 72 hours No results found for this basename: PROTIME,  in the last 72 hours  Imaging: No results found.  Cardiac Studies: Tele - atrial fib, now nsr Assessment/Plan:  1. Atrial fib 2. 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 S/p DCCV, maintaining NSR Rec: ok to discharge home tomorrow if INR ok, if not, lovenox would be ok until INR is therapeutic, may return to work in about a week. I expect his EF to improve with NSR. He will need digoxin, Toprol, warfarin, lasix and i will start ace inhibitor prior to dc. He will need followup nurse visit with INR and with an ECG. I would like to see in arrhythmia clinic in 3-4 weeks. He will need followup in our CHF clinic in 2-3 weeks.  LOS: 6 days    [name] 08/19/2012, 4:29 PM

## 2012-08-19 NOTE — Progress Notes (Signed)
ANTICOAGULATION CONSULT NOTE - Follow Up Consult  Pharmacy Consult for heparin, coumadin Indication: atrial fibrillation with RVR  Allergies  Allergen Reactions  . Amiodarone     Severe hypotension, shock after amiodarone bolus  . Metoprolol Succinate Other (See Comments)    REACTION: achy    Patient Measurements: Weight: 267 lb 6.7 oz (121.3 kg) IBW: 82.2kg Heparin Dosing Weight: 110kg  Vital Signs: Temp: 97.3 F (36.3 C) (03/14 1200) Temp src: Oral (03/14 1200) BP: 108/69 mmHg (03/14 1200) Pulse Rate: 80 (03/14 1200)  Labs:  Recent Labs  08/17/12 0543 08/18/12 0545 08/18/12 1250 08/18/12 2116 08/19/12 0410  HGB 14.8 15.5  --   --  15.7  HCT 42.3 44.2  --   --  44.2  PLT 206 220  --   --  236  LABPROT  --  13.8  --   --  13.9  INR  --  1.07  --   --  1.08  HEPARINUNFRC 0.57 0.23* 0.27* 0.31 0.43  CREATININE 1.28 1.31  --   --   --     The CrCl is unknown because both a height and weight (above a minimum accepted value) are required for this calculation.   Assessment: Lance [name] admitted 3/8 in Afib w RVR and increased SOB.    Heparin per Pharmacy for PAF w RVR.   HL (0.43) therapeutic on  1900 units/hr. On coumadin day #3. Getting PO amiodarone load with 400 mg po TID which can significantly increase INR.  S/p successful  DCCV today.  CBC stable, no bleeding reported.  INR 1.08 after 2 doses of 5 mg of coumadin.  Goal of Therapy:  Heparin level 0.3-0.7 units/ml Monitor platelets by anticoagulation protocol: Yes   Plan:  - continue heparin at 1900 units/hr -coumadin 7.5 mg po x 1 dose today - coumadin book given to pt 3/12, he defered coumadin video to yesterday but it was not charted as done - will re-order it for today  Thank you,  Herby Abraham, Pharm.D. [phone] 08/19/2012 12:34 PM

## 2012-08-19 NOTE — Procedures (Signed)
Electrical Cardioversion Procedure Note Lance [name] [id number] Dec 16, 1980  Procedure: Electrical Cardioversion Indications:  Atrial Fibrillation  Procedure Details Consent: Risks of procedure as well as the alternatives and risks of each were explained to the (patient/caregiver).  Consent for procedure obtained. Time Out: Verified patient identification, verified procedure, site/side was marked, verified correct patient position, special equipment/implants available, medications/allergies/relevent history reviewed, required imaging and test results available.  Performed  Patient placed on cardiac monitor, pulse oximetry, supplemental oxygen as necessary.  Sedation given: propofol Pacer pads placed anterior and posterior chest.  Cardioverted 1 time(s).  Cardioverted at 200J.  Evaluation Findings: Post procedure EKG shows: NSR Complications: None Patient did tolerate procedure well.   Gregg Taylor,M.D. 08/19/2012, 10:46 AM

## 2012-08-19 NOTE — Progress Notes (Signed)
Pt. was placed on BIPAP (v-60) 8/4, BUR: 10, 30% via FFM. Pt. Wears BIPAP QHS at home for OSA. Pt. Is tolerating BIPAP well at this time.

## 2012-08-19 NOTE — Progress Notes (Signed)
PULMONARY  / CRITICAL CARE MEDICINE  Name: Lance [name] MRN: [id number] DOB: 1981/05/07    ADMISSION DATE:  08/13/2012 CONSULTATION DATE:  3/8  REFERRING MD :  Dr Clarise Cruz  CHIEF COMPLAINT:  SOB, acute resp failure, fib rvr  BRIEF PATIENT DESCRIPTION: 31 afib rvr, EF 25%, sudden shock after amio, resp failure  SIGNIFICANT EVENTS / STUDIES:  3/8- afib rvr, resp failure, amio, Hypotension, levophed  LINES / TUBES: PIV  CULTURES:  ANTIBIOTICS:  SUBJECTIVE: cardioversion progressed well, no respiratory complications.    VITAL SIGNS: Temp:  [97.4 F (36.3 C)-98.8 F (37.1 C)] 98 F (36.7 C) (03/14 0400) Pulse Rate:  [89-112] 109 (03/14 0800) Resp:  [18-21] 21 (03/14 0400) BP: (95-137)/(70-97) 119/76 mmHg (03/14 0800) SpO2:  [94 %-100 %] 98 % (03/14 0800) FiO2 (%):  [30 %] 30 % (03/14 0115) Weight:  [121.3 kg (267 lb 6.7 oz)] 121.3 kg (267 lb 6.7 oz) (03/14 0800)  HEMODYNAMICS:   VENTILATOR SETTINGS: Vent Mode:  [-]  FiO2 (%):  [30 %] 30 %  INTAKE / OUTPUT: Intake/Output     03/13 0701 - 03/14 0700 03/14 0701 - 03/15 0700   P.O. 900 40   I.V. (mL/kg) 520 (4.2) 209 (1.7)   Total Intake(mL/kg) 1420 (11.4) 249 (2.1)   Urine (mL/kg/hr) 3400 (1.1)    Total Output 3400     Net -1980 +249         PHYSICAL EXAMINATION: General:  No resp distress no longer with NIMV. Neuro:  Intact, nonfocal. HEENT:  Obese, jvd. Cardiovascular:  s1 s2, irir, distant tones, no edema. Lungs:  Coarse. Abdomen:  Soft, BS wnl, no r/g. Musculoskeletal:  No acute deformities. Skin:  No rash.  LABS:  Recent Labs Lab 08/13/12 0929 08/13/12 0950 08/13/12 1807  08/15/12 0555 08/16/12 0618 08/17/12 0543 08/18/12 0545 08/19/12 0410  HGB  --  13.9  --   < > 14.7 14.9 14.8 15.5 15.7  WBC  --   --   --   < > 9.3 10.1 9.7 9.2 9.7  PLT  --   --   --   < > 244 228 206 220 236  NA  --  140  --   < > 137 137 137 138  --   K  --  4.4  --   < > 4.6 4.5 4.5 4.5  --   CL  --  105  --   < >  104 102 101 102  --   CO2  --   --   --   < > 24 24 24 25   --   GLUCOSE  --  107*  --   < > 131* 94 103* 94  --   BUN  --  14  --   < > 15 17 16 15   --   CREATININE  --  1.20  --   < > 1.12 1.24 1.28 1.31  --   CALCIUM  --   --   --   < > 9.2 9.2 9.2 9.4  --   MG  --   --  1.8  < > 2.4 2.2 2.2  --   --   PHOS  --   --  3.1  --   --   --   --   --   --   INR  --   --   --   --   --   --   --  1.07 1.08  PROBNP 2394.0*  --   --   --   --   --   --   --   --   < > = values in this interval not displayed. No results found for this basename: GLUCAP,  in the last 168 hours  CXR: 3/8 cardiomegaly, interstitial edema   ASSESSMENT / PLAN:  PULMONARY A:  Acute resp failure, near arrest, likely pulm edema from diastolic HF, PE?  P:   - Lasix per cardiology. - Minimal pulmonary edema and improving. - Continue lasix, tolerating well. - Noncompliant osa, cpap at home, BiPAP QHS.  CARDIOVASCULAR A:  Refractory Afib RVR, near arrest after amio, PE? Hyperthyroidism?storm?  P:  - Per cards. - Tolerating PO amiodarone, no IV however given previous events. - Cardioversion in AM per cards with anesthesia's assistance. - Doppler legs neg. - Heparin. - Diureses per cards, 40 PO daily as ordered.  RENAL A:   Rise in crt since 2012  P:   - Follow BMP - Lasix per Cardiology, changed to PO.  GASTROINTESTINAL A:   Near arrest, SUP proph  P:   - Heart healthy diet. - PPI.  HEMATOLOGIC A:   Afib / DVT proph  P:  - Heparin.  INFECTIOUS A:  No evidece infection P:   - BC if spike but hold of abx for now.  ENDOCRINE A:   R/o storm  P:   TSH 0.84 (WNL). Low threshold stress steroids, add cortisol to labs and pending.  NEUROLOGIC A:  Near arrest, neuro intact P:   Monitor.  No respiratory complications during cardioversion.  Patient stable, PCCM will sign off, please call back if needed.  Will need f/u with PCCM for OSA.  Alyson Reedy, M.D. [facility]  Pulmonary/Critical Care Medicine. Pager: [phone]. After hours pager: [phone].

## 2012-08-19 NOTE — Progress Notes (Signed)
Patient: Lance [name] Date of Encounter: 08/19/2012, 7:31 AM Admit date: 08/13/2012     Subjective  [name] denies any new complaints.   Objective  Physical Exam: Vitals: BP 124/97  Pulse 101  Temp(Src) 98 F (36.7 C) (Oral)  Resp 21  Wt 273 lb 13 oz (124.2 kg)  BMI 35.14 kg/m2  SpO2 98% General: Well developed, well appearing 31 year old male in no acute distress. Neck: Supple. JVD not elevated. Lungs: Clear bilaterally to auscultation without wheezes, rales, or rhonchi. Breathing is unlabored. Heart: Irregular S1 S2 without murmurs, rubs, or gallops.  Abdomen: Soft, non-distended. Extremities: No clubbing or cyanosis. No edema.  Distal pedal pulses are 2+ and equal bilaterally. Neuro: Alert and oriented X 3. Moves all extremities spontaneously. No focal deficits.  Intake/Output:  Intake/Output Summary (Last 24 hours) at 08/19/12 0731 Last data filed at 08/19/12 0700  Gross per 24 hour  Intake   1420 ml  Output   3400 ml  Net  -1980 ml    Inpatient Medications:  . amiodarone  400 mg Oral Q8H  . digoxin  0.25 mg Oral Daily  . furosemide  40 mg Oral Daily  . metoprolol succinate  50 mg Oral BID  . pantoprazole  40 mg Oral Daily  . potassium chloride  20 mEq Oral BID  . warfarin   Does not apply Once  . warfarin   Does not apply Once   . sodium chloride 3 mL/hr at 08/18/12 1900  . heparin 1,900 Units/hr (08/19/12 0701)    Labs:  Recent Labs  08/17/12 0543 08/18/12 0545  NA 137 138  K 4.5 4.5  CL 101 102  CO2 24 25  GLUCOSE 103* 94  BUN 16 15  CREATININE 1.28 1.31  CALCIUM 9.2 9.4  MG 2.2  --     Recent Labs  08/18/12 0545 08/19/12 0410  WBC 9.2 9.7  HGB 15.5 15.7  HCT 44.2 44.2  MCV 89.8 89.5  PLT 220 236    Recent Labs  08/19/12 0410  INR 1.08    Radiology/Studies: Dg Chest 2 View (if Patient Has Fever And/or Copd)  08/13/2012  *RADIOLOGY REPORT*  Clinical Data: Shortness of breath.  History of atrial fibrillation.  CHEST - 2  VIEW  Comparison: Chest x-ray 11/19/2010.  Findings: No acute consolidative airspace disease.  No pleural effusions. There is cephalization of the pulmonary vasculature and slight indistinctness of the interstitial markings suggestive of mild pulmonary edema.  Mild cardiomegaly, and new compared to the prior examination.  Upper mediastinal contours are within normal limits.  IMPRESSION: 1.  New-onset of mild cardiomegaly, with findings suggestive of mild congestive heart failure, as above.  Clinical correlation is recommended.   Original Report Authenticated By: Trudie Reed, M.D.    Dg Chest Port 1 View  08/16/2012  *RADIOLOGY REPORT*  Clinical Data: Shortness of breath  PORTABLE CHEST - 1 VIEW  Comparison: Portable exam 0013 hours compared to 08/15/2012  Findings: Enlargement of cardiac silhouette. Mediastinal contours and pulmonary vascularity normal. Lungs clear. No pleural effusion or pneumothorax. Bones unremarkable.  IMPRESSION: Enlargement of cardiac silhouette. No acute infiltrates.   Original Report Authenticated By: Ulyses Southward, M.D.    Dg Chest Port 1 View  08/15/2012  *RADIOLOGY REPORT*  Clinical Data: Follow-up evaluation of pulmonary edema.  PORTABLE CHEST - 1 VIEW  Comparison: Chest x-ray 08/13/2012.  Findings: Moderate cardiomegaly has worsened.  No signs of pulmonary edema at this time.  No consolidative airspace disease or pleural effusions.  Upper mediastinal contours are within normal limits.  No pneumothorax.  IMPRESSION: 1.  Worsening moderate cardiomegaly.  No signs of edema at this time.   Original Report Authenticated By: Trudie Reed, M.D.     Telemetry: persistent atrial fibrillation     Assessment and Plan  1. Atrial fibrillation with a RVR  2. Acute systolic HF  3. HTN  4. Cardiogenic shock after IV amio - now resolved   [name] has new onset cardiomyopathy with HF in setting of rapid AF of unknown duration. 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 Developed  cardiogenic shock after IV amiodarone. Now on PO amiodarone for rate control. BB and digoxin added. On warfarin and IV heparin. Plan for TEE-guided DCCV today.   Dr. [name] to see Signed, Rick Duff PA-C  ADDENDUM: [name] had TEE done on 08/14/2012 showing no LA/LAA thrombus. He has been on anticoagulation since then with IV heparin and now warfarin. Per Dr. [name], will only need DCCV today. Reviewed with [name]. Dr. [name] to see.  EP Attending  Patient seen and examined. Agree with above. The patient has undergone amiodarone loading. I have discussed the indications, risks/benefits/goals/expectations of DCCV and he wishes to proceed.  Leonia Reeves.D.

## 2012-08-19 NOTE — Transfer of Care (Signed)
Immediate Anesthesia Transfer of Care Note  Patient: Lance [name]  Procedure(s) Performed: * No procedures listed *  Patient Location: Stepdown 2600  Anesthesia Type:MAC  Level of Consciousness: awake, alert  and oriented  Airway & Oxygen Therapy: Patient Spontanous Breathing and Patient connected to nasal cannula oxygen  Post-op Assessment: Report given to PACU RN, Post -op Vital signs reviewed and stable and Patient moving all extremities X 4  Post vital signs: Reviewed and stable  Complications: No apparent anesthesia complications

## 2012-08-20 LAB — CBC
MCH: 30.9 pg (ref 26.0–34.0)
MCHC: 35 g/dL (ref 30.0–36.0)
Platelets: 219 10*3/uL (ref 150–400)

## 2012-08-20 LAB — PROTIME-INR: Prothrombin Time: 14.1 seconds (ref 11.6–15.2)

## 2012-08-20 MED ORDER — WARFARIN SODIUM 7.5 MG PO TABS
7.5000 mg | ORAL_TABLET | Freq: Once | ORAL | Status: AC
  Administered 2012-08-20: 7.5 mg via ORAL
  Filled 2012-08-20: qty 1

## 2012-08-20 MED ORDER — LABETALOL HCL 5 MG/ML IV SOLN
INTRAVENOUS | Status: AC
  Filled 2012-08-20: qty 4

## 2012-08-20 MED ORDER — METOPROLOL TARTRATE 50 MG PO TABS
75.0000 mg | ORAL_TABLET | Freq: Two times a day (BID) | ORAL | Status: DC
  Administered 2012-08-20 – 2012-08-22 (×4): 75 mg via ORAL
  Filled 2012-08-20 (×6): qty 1

## 2012-08-20 MED ORDER — HEPARIN (PORCINE) IN NACL 100-0.45 UNIT/ML-% IJ SOLN
1400.0000 [IU]/h | INTRAMUSCULAR | Status: DC
Start: 2012-08-20 — End: 2012-08-22
  Administered 2012-08-20: 1650 [IU]/h via INTRAVENOUS
  Administered 2012-08-21: 1500 [IU]/h via INTRAVENOUS
  Administered 2012-08-22: 1400 [IU]/h via INTRAVENOUS
  Filled 2012-08-20 (×3): qty 250

## 2012-08-20 MED ORDER — LABETALOL HCL 5 MG/ML IV SOLN: 20.0000 mg | Freq: Once | INTRAVENOUS | Status: AC

## 2012-08-20 MED ORDER — METOPROLOL TARTRATE 25 MG PO TABS
25.0000 mg | ORAL_TABLET | Freq: Once | ORAL | Status: AC
  Administered 2012-08-20: 25 mg via ORAL

## 2012-08-20 NOTE — Progress Notes (Signed)
ANTICOAGULATION CONSULT NOTE - Follow Up Consult  Pharmacy Consult for heparin and warfarin Indication: atrial fibrillation with RVR  Allergies  Allergen Reactions  . Amiodarone     Severe hypotension, shock after amiodarone bolus  . Metoprolol Succinate Other (See Comments)    REACTION: achy    Patient Measurements: Weight: 267 lb 6.7 oz (121.3 kg) IBW: 82.2kg Heparin Dosing Weight: 108kg  Vital Signs: Temp: 98.4 F (36.9 C) (03/15 0737) Temp src: Oral (03/15 0737) BP: 105/57 mmHg (03/15 0335) Pulse Rate: 71 (03/15 0109)  Labs:  Recent Labs  08/18/12 0545  08/18/12 2116 08/19/12 0410 08/20/12 0511  HGB 15.5  --   --  15.7 15.0  HCT 44.2  --   --  44.2 42.8  PLT 220  --   --  236 219  LABPROT 13.8  --   --  13.9 14.1  INR 1.07  --   --  1.08 1.10  HEPARINUNFRC 0.23*  < > 0.31 0.43 1.09*  CREATININE 1.31  --   --   --   --   < > = values in this interval not displayed.  The CrCl is unknown because both a height and weight (above a minimum accepted value) are required for this calculation.   Assessment: 59 yoM admitted 3/8 in Afib w RVR and increased SOB. Underwent successful DCCV conversion yesterday. Heparin level had been therapeutic x2, and this morning was increased to 1.09. Spoke with nurse and no concerns it was drawn from infusing line. Today is also warfarin day #4. INR this morning is 1.1. He did receive a load of amiodarone which can potentiate INR, however it did not increase much from yesterday. CBC is stable. No bleeding noted.  Goal of Therapy:  Heparin level 0.3-0.7 units/ml INR 2-3 Monitor platelets by anticoagulation protocol: Yes    Plan:  1. Hold heparin for 1 hour 2. restart heparin at 1650 units/hr 3. Recheck heparin level 6 hours after restarting (~1500) 4. warfarin 7.5 mg po x 1 dose 5. Patient needs verbal education- did receive the Coumadin book and declined Coumadin video.  Deon Ivey D. Hasan Douse, PharmD Clinical Pharmacist Pager:  [phone] 08/20/2012 7:45 AM

## 2012-08-20 NOTE — Progress Notes (Signed)
ANTICOAGULATION CONSULT NOTE - Follow Up Consult  Pharmacy Consult for heparin Indication: atrial fibrillation with RVR  Allergies  Allergen Reactions  . Amiodarone     Severe hypotension, shock after amiodarone bolus  . Metoprolol Succinate Other (See Comments)    REACTION: achy    Patient Measurements: Weight: 267 lb 6.7 oz (121.3 kg) IBW: 82.2kg Heparin Dosing Weight: 108kg  Vital Signs: Temp: 98.4 F (36.9 C) (03/15 1613) Temp src: Oral (03/15 1613) BP: 90/59 mmHg (03/15 1613) Pulse Rate: 95 (03/15 1238)  Labs:  Recent Labs  08/18/12 0545  08/19/12 0410 08/20/12 0511 08/20/12 1535  HGB 15.5  --  15.7 15.0  --   HCT 44.2  --  44.2 42.8  --   PLT 220  --  236 219  --   LABPROT 13.8  --  13.9 14.1  --   INR 1.07  --  1.08 1.10  --   HEPARINUNFRC 0.23*  < > 0.43 1.09* 0.81*  CREATININE 1.31  --   --   --   --   < > = values in this interval not displayed.  The CrCl is unknown because both a height and weight (above a minimum accepted value) are required for this calculation.   Assessment: 63 yoM admitted 3/8 in Afib w RVR and increased SOB. Underwent successful DCCV conversion yesterday. Heparin level had been therapeutic x2, and this morning was increased to 1.09. Spoke with nurse and no concerns it was drawn from infusing line. Heparin level remains elevated despite rate decrease.  Goal of Therapy:  Heparin level 0.3-0.7 units/ml INR 2-3 Monitor platelets by anticoagulation protocol: Yes    Plan:  1. Decrease heparin to 1500 units/hr 2. Recheck heparin level 8 hours after rate change  Talbert Cage, PharmD Clinical Pharmacist Pager: [phone] 08/20/2012 4:22 PM

## 2012-08-20 NOTE — Progress Notes (Signed)
Patient ID: Lance [name], male   DOB: June 03, 1981, 32 y.o.   MRN: [id number] Subjective:  Patient went back into atrial fib this am.  Associated with marked HTN.  His BP normalized very quicikly without medication.  He remains in AF.   Objective:  Vital Signs in the last 24 hours: Temp:  [97.3 F (36.3 C)-98.7 F (37.1 C)] 98.4 F (36.9 C) (03/15 0737) Pulse Rate:  [71-96] 96 (03/15 0941) Resp:  [16-18] 16 (03/15 0737) BP: (105-230)/(57-160) 203/157 mmHg (03/15 0947) SpO2:  [96 %-100 %] 97 % (03/15 0737) FiO2 (%):  [30 %] 30 % (03/15 0109)  Intake/Output from previous day: 03/14 0701 - 03/15 0700 In: 1128 [P.O.:400; I.V.:728] Out: 1925 [Urine:1925] Intake/Output from this shift: Total I/O In: 220 [P.O.:220] Out: -   Physical Exam: Well appearing NAD HEENT: Unremarkable Neck:  No JVD, no thyromegally Lungs:  Clear with no wheezes HEART:  Irregularly irregular, HR 98 Abd:  Flat, positive bowel sounds, no organomegally, no rebound, no guarding Ext:  2 plus pulses, no edema, no cyanosis, no clubbing Skin:  No rashes no nodules Neuro:  CN II through XII intact, motor grossly intact  Lab Results:  Recent Labs  08/19/12 0410 08/20/12 0511  WBC 9.7 7.4  HGB 15.7 15.0  PLT 236 219    Recent Labs  08/18/12 0545  NA 138  K 4.5  CL 102  CO2 25  GLUCOSE 94  BUN 15  CREATININE 1.31   No results found for this basename: TROPONINI, CK, MB,  in the last 72 hours Hepatic Function Panel No results found for this basename: PROT, ALBUMIN, AST, ALT, ALKPHOS, BILITOT, BILIDIR, IBILI,  in the last 72 hours No results found for this basename: CHOL,  in the last 72 hours No results found for this basename: PROTIME,  in the last 72 hours  Imaging: No results found.  Cardiac Studies: Tele - atrial fib, ventricular rate of 98 Assessment/Plan:  1. Atrial fib - he was cardioverted to NSR  but now is back in A-Fib.   Continue amiodarone.  Increase metoprolol. Continue  digoxin.  INR is 1.10.  Getting heparin and coumadin.  2. Acute systolic chf, possibly due to a tachy mediated CM- will increase metoprolol to 75 BID ( change to tartrate from succinate) 3. S/p DCCV,   Will keep in step down today.  Possible transfer to tele tomorrow.    LOS: 7 days    Elyn Aquas. 08/20/2012, 10:31 AM

## 2012-08-21 LAB — CBC
HCT: 43.3 % (ref 39.0–52.0)
Hemoglobin: 15.7 g/dL (ref 13.0–17.0)
MCHC: 36.3 g/dL — ABNORMAL HIGH (ref 30.0–36.0)
MCV: 88.4 fL (ref 78.0–100.0)
RDW: 13.2 % (ref 11.5–15.5)
WBC: 6.9 10*3/uL (ref 4.0–10.5)

## 2012-08-21 LAB — HEPARIN LEVEL (UNFRACTIONATED): Heparin Unfractionated: 0.75 IU/mL — ABNORMAL HIGH (ref 0.30–0.70)

## 2012-08-21 MED ORDER — SODIUM CHLORIDE 0.9 % IJ SOLN: 3.0000 mL | INTRAMUSCULAR | Status: DC | PRN

## 2012-08-21 MED ORDER — WARFARIN SODIUM 10 MG PO TABS
10.0000 mg | ORAL_TABLET | Freq: Once | ORAL | Status: AC
  Administered 2012-08-21: 10 mg via ORAL
  Filled 2012-08-21 (×2): qty 1

## 2012-08-21 MED ORDER — SODIUM CHLORIDE 0.9 % IJ SOLN
3.0000 mL | Freq: Two times a day (BID) | INTRAMUSCULAR | Status: DC
  Administered 2012-08-21: 3 mL via INTRAVENOUS

## 2012-08-21 MED ORDER — SODIUM CHLORIDE 0.9 % IV SOLN: 250.0000 mL | INTRAVENOUS | Status: DC

## 2012-08-21 NOTE — Progress Notes (Signed)
ANTICOAGULATION CONSULT NOTE - Follow Up Consult  Pharmacy Consult for heparin Indication: atrial fibrillation  Labs:  Recent Labs  08/18/12 0545  08/19/12 0410 08/20/12 0511 08/20/12 1535 08/21/12 0013  HGB 15.5  --  15.7 15.0  --  15.7  HCT 44.2  --  44.2 42.8  --  43.3  PLT 220  --  236 219  --  230  LABPROT 13.8  --  13.9 14.1  --  14.9  INR 1.07  --  1.08 1.10  --  1.19  HEPARINUNFRC 0.23*  < > 0.43 1.09* 0.81* 0.58  CREATININE 1.31  --   --   --   --   --   < > = values in this interval not displayed.   Assessment/Plan:  32yo male now therapeutic on heparin after rate decreases.  Will continue gtt at current rate and confirm stable with additional level.  Vernard Gambles, PharmD, BCPS  08/21/2012,1:16 AM

## 2012-08-21 NOTE — Progress Notes (Signed)
Patient ID: Lance [name], male   DOB: 1980/09/13, 32 y.o.   MRN: [id number] Subjective:  Patient went back into atrial fib this am.  Associated with marked HTN.  His BP normalized very quicikly without medication.  He remains in AF.   Objective:  Vital Signs in the last 24 hours: Temp:  [98.2 F (36.8 C)-98.7 F (37.1 C)] 98.7 F (37.1 C) (03/16 0743) Pulse Rate:  [83-95] 83 (03/16 0401) Resp:  [16-18] 16 (03/16 0743) BP: (83-221)/(57-160) 83/61 mmHg (03/16 0743) SpO2:  [97 %-98 %] 97 % (03/16 0743) Weight:  [272 lb 0.8 oz (123.4 kg)-272 lb 7.8 oz (123.6 kg)] 272 lb 0.8 oz (123.4 kg) (03/16 0500)  Intake/Output from previous day: 03/15 0701 - 03/16 0700 In: 1526.6 [P.O.:1110; I.V.:416.6] Out: 1450 [Urine:1450] Intake/Output from this shift:    Physical Exam: Well appearing NAD HEENT: Unremarkable Neck:  No JVD, no thyromegally Lungs:  Clear with no wheezes HEART:  Irregularly irregular, HR 98 Abd:  Flat, positive bowel sounds, no organomegally, no rebound, no guarding Ext:  2 plus pulses, no edema, no cyanosis, no clubbing Skin:  No rashes no nodules Neuro:  CN II through XII intact, motor grossly intact  Lab Results:  Recent Labs  08/20/12 0511 08/21/12 0013  WBC 7.4 6.9  HGB 15.0 15.7  PLT 219 230   No results found for this basename: NA, K, CL, CO2, GLUCOSE, BUN, CREATININE,  in the last 72 hours No results found for this basename: TROPONINI, CK, MB,  in the last 72 hours Hepatic Function Panel No results found for this basename: PROT, ALBUMIN, AST, ALT, ALKPHOS, BILITOT, BILIDIR, IBILI,  in the last 72 hours No results found for this basename: CHOL,  in the last 72 hours No results found for this basename: PROTIME,  in the last 72 hours  Imaging: No results found.  Cardiac Studies: Tele - atrial fib, ventricular rate of 98 Assessment/Plan:  1. Atrial fib - he was cardioverted to NSR  but now is back in A-Fib.   Continue amiodarone.  Increase metoprolol.  Continue digoxin.  INR is 1.10.  Getting heparin and coumadin.  2. Acute systolic chf, possibly due to a tachy mediated CM- continue metoprolol to 75 BID   3. S/p DCCV,  Dr. [name] may want to try cardioversion again on Monday.  The patient mentioned that he had stated that he would try again if he failed to hold sinus rhythm.   Pt has been loaded on Amio.   Will write orders .   Transfer to tele   LOS: 8 days    Elyn Aquas. 08/21/2012, 9:44 AM

## 2012-08-21 NOTE — Progress Notes (Addendum)
ANTICOAGULATION CONSULT NOTE - Follow Up Consult  Pharmacy Consult for heparin and warfarin Indication: atrial fibrillation with RVR  Allergies  Allergen Reactions  . Amiodarone     Severe hypotension, shock after amiodarone bolus  . Metoprolol Succinate Other (See Comments)    REACTION: achy    Patient Measurements: Height: 6\' 2"  (188 cm) Weight: 272 lb 0.8 oz (123.4 kg) IBW/kg (Calculated) : 82.2 IBW: 82.2kg Heparin Dosing Weight: 108kg  Vital Signs: Temp: 98.7 F (37.1 C) (03/16 0743) Temp src: Oral (03/16 0743) BP: 107/85 mmHg (03/16 0949) Pulse Rate: 83 (03/16 0401)  Labs:  Recent Labs  08/19/12 0410 08/20/12 0511 08/20/12 1535 08/21/12 0013 08/21/12 0857  HGB 15.7 15.0  --  15.7  --   HCT 44.2 42.8  --  43.3  --   PLT 236 219  --  230  --   LABPROT 13.9 14.1  --  14.9  --   INR 1.08 1.10  --  1.19  --   HEPARINUNFRC 0.43 1.09* 0.81* 0.58 0.75*    Estimated Creatinine Clearance: 112 ml/min (by C-G formula based on Cr of 1.31).   Assessment: 32 yoM admitted 3/8 in Afib w RVR and increased SOB. Underwent successful DCCV conversion 3/14. Heparin level had been therapeutic and now has been elevated- this morning was therapeutic, but recheck to confirm was 0.77 Today is also warfarin day #5. INR this morning is 1.19 after receiving 2 doses of 5mg  and 2 doses of 7.5mg . Amiodarone can affect INR, however his is not moving much. CBC is stable. No bleeding noted.  Goal of Therapy:  Heparin level 0.3-0.7 units/ml INR 2-3 Monitor platelets by anticoagulation protocol: Yes    Plan:  1. Decrease heparin to 1400 units/hr 2. Warfarin 10mg  po x1 tonight 3. 6 hour HL 4. Daily HL, INR, and CBC 5. Will education patient on warfarin  Lauren D. Bajbus, PharmD Clinical Pharmacist Pager: [phone] 08/21/2012 10:07 AM   6h heparin level is therapeutic at 0.63. Continue heparin at 1400 units/hr and follow up heparin level in AM.   Louie Casa, PharmD,  BCPS 08/21/2012, 4:19PM

## 2012-08-22 ENCOUNTER — Telehealth: Payer: Self-pay | Admitting: *Deleted

## 2012-08-22 LAB — CBC
MCH: 31.3 pg (ref 26.0–34.0)
MCHC: 36.1 g/dL — ABNORMAL HIGH (ref 30.0–36.0)
MCV: 86.8 fL (ref 78.0–100.0)
Platelets: 233 10*3/uL (ref 150–400)
RDW: 13.3 % (ref 11.5–15.5)
WBC: 6 10*3/uL (ref 4.0–10.5)

## 2012-08-22 MED ORDER — AMIODARONE HCL 400 MG PO TABS: ORAL_TABLET | ORAL | Status: DC

## 2012-08-22 MED ORDER — RIVAROXABAN 20 MG PO TABS
20.0000 mg | ORAL_TABLET | Freq: Every day | ORAL | Status: DC
  Administered 2012-08-22: 20 mg via ORAL
  Filled 2012-08-22: qty 1

## 2012-08-22 MED ORDER — RIVAROXABAN 20 MG PO TABS: 20.0000 mg | ORAL_TABLET | Freq: Every day | ORAL | Status: DC

## 2012-08-22 MED ORDER — FUROSEMIDE 40 MG PO TABS: 40.0000 mg | ORAL_TABLET | Freq: Every day | ORAL | Status: DC

## 2012-08-22 MED ORDER — RAMIPRIL 2.5 MG PO CAPS: 2.5000 mg | ORAL_CAPSULE | Freq: Every day | ORAL | Status: DC

## 2012-08-22 MED ORDER — METOPROLOL TARTRATE 25 MG PO TABS: 75.0000 mg | ORAL_TABLET | Freq: Two times a day (BID) | ORAL | Status: DC

## 2012-08-22 MED ORDER — PANTOPRAZOLE SODIUM 40 MG PO TBEC: 40.0000 mg | DELAYED_RELEASE_TABLET | Freq: Every day | ORAL | Status: DC

## 2012-08-22 MED ORDER — DIGOXIN 250 MCG PO TABS: 0.2500 mg | ORAL_TABLET | Freq: Every day | ORAL | Status: DC

## 2012-08-22 MED ORDER — POTASSIUM CHLORIDE CRYS ER 20 MEQ PO TBCR: 20.0000 meq | EXTENDED_RELEASE_TABLET | Freq: Two times a day (BID) | ORAL | Status: DC

## 2012-08-22 NOTE — Telephone Encounter (Signed)
Brooke called needing xarelto 20 mg for pt, samples provided and placed at desk for pt to pick up.

## 2012-08-22 NOTE — Progress Notes (Signed)
Went over all discharge instructions with pt including Heart Failure packet which he had read front to back, daily weights, nutrition, fluid and salt restriction, at fib handout, daily home meds, follow up appts, and when to call the DR. Pt has meds called into pharmacy for pick up and Xarelto to be picked up at Cardiologist's office today.

## 2012-08-22 NOTE — Progress Notes (Signed)
Patient ID: Lance [name], male   DOB: 28-Sep-1980, 32 y.o.   MRN: [id number] Subjective:  Patient is in atrial flutter today but asymptomatic.  He is better rate controlled. He denies CP or SOB and wants to go home.  Objective:  Vital Signs in the last 24 hours: Temp:  [97.3 F (36.3 C)-98.3 F (36.8 C)] 98.2 F (36.8 C) (03/17 0544) Pulse Rate:  [79-96] 79 (03/17 0544) Resp:  [19-20] 20 (03/17 0544) BP: (100-126)/(62-85) 117/74 mmHg (03/17 0544) SpO2:  [96 %-100 %] 100 % (03/17 0544) Weight:  [265 lb 14 oz (120.6 kg)-267 lb 3.2 oz (121.2 kg)] 265 lb 14 oz (120.6 kg) (03/17 0544)  Intake/Output from previous day: 03/16 0701 - 03/17 0700 In: 997.6 [P.O.:660; I.V.:337.6] Out: 1550 [Urine:1550] Intake/Output from this shift:    Physical Exam: Well appearing NAD HEENT: OP clear Neck:  No JVD, no thyromegally Lungs:  Clear with no wheezes HEART:  Irregularly irregular, HR 98 Abd:  Flat, positive bowel sounds, no organomegally, no rebound, no guarding Ext:  2 plus pulses, no edema, no cyanosis, no clubbing Skin:  No rashes no nodules Neuro:  CN II through XII intact, motor grossly intact  Lab Results:  Recent Labs  08/21/12 0013 08/22/12 0625  WBC 6.9 6.0  HGB 15.7 16.3  PLT 230 233   No results found for this basename: NA, K, CL, CO2, GLUCOSE, BUN, CREATININE,  in the last 72 hours No results found for this basename: TROPONINI, CK, MB,  in the last 72 hours Hepatic Function Panel No results found for this basename: PROT, ALBUMIN, AST, ALT, ALKPHOS, BILITOT, BILIDIR, IBILI,  in the last 72 hours No results found for this basename: CHOL,  in the last 72 hours No results found for this basename: PROTIME,  in the last 72 hours  Imaging: No results found.  Cardiac Studies: Tele - atrial flutter , ventricular rate of 80s  Assessment/Plan:  1. Atrial fib/ atrial flutter - he was cardioverted to NSR  but now is back in A-Fib.   Continue amiodarone load as an outpatient  and follow-up with Dr [name] in 3weeks.  Continue metoprolol. Continue digoxin.  He may require cardioversion or ablation after he sees Dr [name].  INR is not therapeutic.  I will switch to xarleto.  He will need samples until his medical insurance kicks in in a few weeks.  We will need to provide samples at discharge.  2. 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   LOS: 9 days    Hillis Range 08/22/2012, 8:01 AM

## 2012-08-22 NOTE — Progress Notes (Signed)
Pt discharged. Pt did not want to wait for w/c. Decided to ambulate. Family member drove pt home in private vehicle.

## 2012-08-22 NOTE — Progress Notes (Signed)
Nutrition Brief Note  Patient identified on the Malnutrition Screening Tool (MST) Report.  Wt Readings from Last 10 Encounters:  08/22/12 265 lb 14 oz (120.6 kg)  08/22/12 265 lb 14 oz (120.6 kg)  08/22/12 265 lb 14 oz (120.6 kg)  12/08/10 256 lb (116.121 kg)  11/14/10 259 lb (117.482 kg)  04/11/10 270 lb (122.471 kg)  11/11/09 270 lb 8 oz (122.698 kg)  10/14/09 274 lb 8 oz (124.512 kg)  11/09/08 255 lb (115.667 kg)    Body mass index is 34.12 kg/(m^2). Patient meets criteria for Obesity Class I based on current BMI.   Current diet order is Heart Healthy, patient is consuming approximately 100% of meals at this time. Labs and medications reviewed.   No nutrition interventions warranted at this time. If nutrition issues arise, please consult RD.   Maureen Chatters, RD, LDN Pager #: [phone] After-Hours Pager #: [phone]

## 2012-08-22 NOTE — Discharge Summary (Signed)
CARDIOLOGY DISCHARGE SUMMARY    Patient ID: Lance [name],  MRN: [id number], DOB/AGE: [birth year] 34 y.o.  Admit date: 08/13/2012 Discharge date: 08/22/2012  Primary Care Physician: Sonda Primes, MD Primary Cardiologist: [name], MD  Primary Discharge Diagnosis:  1. Atrial fibrillation/atrial flutter 2. Acute systolic HF 3. Presumed NICM, newly diagnosed 4. Cardiogenic shock after IV amiodarone administration  Secondary Discharge Diagnoses:  1. HTN 2. OSA  Procedures This Admission:  1. 2-D echo - difficult images, EF 30-35%, moderate MR, RV normal 2. TEE-guided DCCV 08/14/2012 -demonstrated dilated CM, EF 10-15%, with globeal hypokinesis, mild to moderate MR, no LA/LAA clot; DCCV unsuccessful  3. DCCV 08/19/2012 - to SR after 200J shock x1  History and Hospital Course:  Lance [name] is a 32 year old man with PAF diagnosed initially in 2003, HTN and OSA who presented on 08/13/2012 with rapid AF and acute systolic HF with new LV dysfunction diagnosed by echo this admission. He was started on IV heparin and warfarin. In addition, he was started on IV diltiazem without much improvement in his rate. Therefore, IV amiodarone was added. He tolerated the first bolus of amiodarone well and was on drip without problem; however, after receiving a second bolus of amiodarone he became presyncopal and diaphoretic with brief hypertensive response, SBP > 200. A few minutes later Lance [name] became profoundly hypotensive, diaphoretic with LOC, SBP down to 44. He was then placed on high dose Levophed and given IVF bolus. Eventually his SBP was back up to 140 and Levophed weaned to off. An arterial line was placed but Lance [name] refused a central line. Bedside echo repeated showing an EF of 25%, no effusion. He was then transferred to ICU with cardiogenic shock related to probable reaction to amiodarone. He was continued on IV Lasix with good diuresis. On 08/14/2012, he underwent TEE-guided DCCV. TEE  demonstrated dilated CM, EF 10-15%, with globeal hypokinesis, mild to moderate MR, no LA/LAA clot. DCCV was unsuccessful. He was then seen in consultation by Dr. [name] for EP recommendations. Consideration was given to AAD therapy with Tikosyn vs PO amiodarone. He was given one dose of Tikosyn and developed polymorphic PVCs; therefore, this was stopped. He was started on PO amiodarone loading. He tolerated this well. He remained hemodynamically stable. His diuretic therapy was transitioned to PO. He was transferred to stepdown on 08/17/2012. He then underwent repeat DCCV on 08/19/2012 which was successful for restoring SR after 200J shock x 1. Unfortunately, he went into atrial flutter over the weekend; however, his rate has been well controlled and he is asymptomatic. He remains hemodynamically stable and is eager to go home. His INR is still not therapeutic and Lance [name] cannot afford outpatient Lovenox for bridging. Therefore, after discussing options with Dr. Johney Frame, [name] has elected to start Xarelto and we will provide samples for the next 2-3 weeks until he has medical insurance. He will continue metoprolol, digoxin and amiodarone for now. He has been seen, examined and deemed stable for discharge today by Dr. Hillis Range with close outpatient follow-up. He will follow-up with Dr. [name] in 2-3 weeks for EP follow-up and possible repeat DCCV. He will follow-up with Dr. [name] in 2 weeks to establish HF care.   Discharge Vitals: Blood pressure 120/72, pulse 82, temperature 98.2 F (36.8 C), temperature source Oral, resp. rate 20, height 6\' 2"  (1.88 m), weight 265 lb 14 oz (120.6 kg), SpO2 100.00%.   Labs: Lab Results  Component Value Date  WBC 6.0 08/22/2012   HGB 16.3 08/22/2012   HCT 45.2 08/22/2012   MCV 86.8 08/22/2012   PLT 233 08/22/2012     Recent Labs Lab 08/18/12 0545  NA 138  K 4.5  CL 102  CO2 25  BUN 15  CREATININE 1.31  CALCIUM 9.4  GLUCOSE 94     Recent  Labs  08/22/12 0625  INR 1.46    Disposition:  The patient is being discharged in stable condition.  Follow-up:     Follow-up Information   Follow up with [name], MD On 09/08/2012. (At 11:30 AM)    Contact information:   [address] [place] [address] [phone]      Follow up with [name], MD On 09/12/2012. (At 11:30 AM (garage code [id number]))    Contact information:   [address] [place] [address] [phone]     Discharge Medications:    Medication List    STOP taking these medications       HYDROcodone-acetaminophen 5-325 MG per tablet  Commonly known as:  NORCO/VICODIN     sulfamethoxazole-trimethoprim 800-160 MG per tablet  Commonly known as:  BACTRIM DS      TAKE these medications       amiodarone 400 MG tablet  Commonly known as:  PACERONE  Take 1 tab(400mg ) three times daily x1 wk, then 1 tab twice daily x2 wks, then 1 tab once daily x2 wks, then 1/2 tab(200mg ) once daily.     digoxin 0.25 MG tablet  Commonly known as:  LANOXIN  Take 1 tablet (0.25 mg total) by mouth daily.     furosemide 40 MG tablet  Commonly known as:  LASIX  Take 1 tablet (40 mg total) by mouth daily.     metoprolol tartrate 25 MG tablet  Commonly known as:  LOPRESSOR  Take 3 tablets (75 mg total) by mouth 2 (two) times daily.     pantoprazole 40 MG tablet  Commonly known as:  PROTONIX  Take 1 tablet (40 mg total) by mouth daily.     potassium chloride SA 20 MEQ tablet  Commonly known as:  K-DUR,KLOR-CON  Take 1 tablet (20 mEq total) by mouth 2 (two) times daily.     ramipril 2.5 MG capsule  Commonly known as:  ALTACE  Take 1 capsule (2.5 mg total) by mouth daily.     Rivaroxaban 20 MG Tabs  Commonly known as:  XARELTO  Take 1 tablet (20 mg total) by mouth daily.       Duration of Discharge Encounter: Greater than 30 minutes including physician time.  [name], PA-C 08/22/2012, 10:10  AM    Hillis Range, MD

## 2012-08-25 ENCOUNTER — Telehealth: Payer: Self-pay | Admitting: Internal Medicine

## 2012-08-25 NOTE — Telephone Encounter (Signed)
TCM; spoke with patient who states he is feeling well today; denies SOB, chest pain or discomfort. Patient verbalizes awareness of appointments with Dr. [name] and Dr. [name]. Patient instructed to call our office if he has any concerns.

## 2012-09-08 ENCOUNTER — Encounter: Payer: Self-pay | Admitting: *Deleted

## 2012-09-08 ENCOUNTER — Other Ambulatory Visit: Payer: Self-pay | Admitting: *Deleted

## 2012-09-08 ENCOUNTER — Ambulatory Visit (INDEPENDENT_AMBULATORY_CARE_PROVIDER_SITE_OTHER): Payer: Self-pay | Admitting: Internal Medicine

## 2012-09-08 ENCOUNTER — Encounter: Payer: Self-pay | Admitting: Internal Medicine

## 2012-09-08 VITALS — BP 107/69 | HR 80 | Ht 74.0 in | Wt 270.0 lb

## 2012-09-08 DIAGNOSIS — I5021 Acute systolic (congestive) heart failure: Secondary | ICD-10-CM

## 2012-09-08 DIAGNOSIS — I4892 Unspecified atrial flutter: Secondary | ICD-10-CM

## 2012-09-08 DIAGNOSIS — I4891 Unspecified atrial fibrillation: Secondary | ICD-10-CM

## 2012-09-08 LAB — CBC WITH DIFFERENTIAL/PLATELET
Eosinophils Relative: 1.4 % (ref 0.0–5.0)
HCT: 44.7 % (ref 39.0–52.0)
Lymphocytes Relative: 21.3 % (ref 12.0–46.0)
Lymphs Abs: 1.3 10*3/uL (ref 0.7–4.0)
Monocytes Relative: 10.6 % (ref 3.0–12.0)
Platelets: 250 10*3/uL (ref 150.0–400.0)
WBC: 6 10*3/uL (ref 4.5–10.5)

## 2012-09-08 LAB — BASIC METABOLIC PANEL
BUN: 14 mg/dL (ref 6–23)
GFR: 91.25 mL/min (ref >60.00)

## 2012-09-08 NOTE — Assessment & Plan Note (Signed)
The patient has a history of severe left ventricular dysfunction. This may well be tachycardia mediated. My plan is to maintain him in sinus rhythm, and repeat his echocardiogram after restoration of sinus rhythm.

## 2012-09-08 NOTE — Patient Instructions (Signed)

## 2012-09-08 NOTE — Assessment & Plan Note (Addendum)
He has undergone cardioversion back to sinus rhythm, and is now in atrial flutter. He'll continue amiodarone therapy. Because of his atrial flutter and the unlikelihood of response to medical therapy, I recommended proceeding with catheter ablation. He will be been maintained on amiodarone. I would expect that he will require ultimately catheter ablation of his atrial fibrillation but would like him to maintain sinus rhythm for several months prior to catheter ablation.

## 2012-09-08 NOTE — Progress Notes (Signed)
HPI Lance [name] returns today for followup. He is a pleasant 31-year-old man with a history of atrial fibrillation and atrial flutter. The patient initially presented with atrial fibrillation and rapid ventricular sponsor associated with severe left ventricular dysfunction and hypotension. He was placed on intravenous amiodarone, and became profoundly hypotensive. Ultimately, he was restarted on oral amiodarone and underwent cardioversion. At some point, he went from sinus rhythm back into atrial flutter. He is on amiodarone for her milligrams daily. He has minimal palpitations. He also remains on digoxin, Lasix, metoprolol, Avapro, and anticoagulation with Xarelto. The patient breathing is better. He still has some palpitations. Allergies  Allergen Reactions  . Amiodarone Other (See Comments)    IV intolerance with Severe hypotension, shock after amiodarone bolus.  Taking po Amiodarone without problems.  . Metoprolol Succinate Other (See Comments)    REACTION: achy     Current Outpatient Prescriptions  Medication Sig Dispense Refill  . amiodarone (PACERONE) 400 MG tablet Take 1 tab(400mg) three times daily x1 wk, then 1 tab twice daily x2 wks, then 1 tab once daily x2 wks, then 1/2 tab(200mg) once daily.  90 tablet  3  . digoxin (LANOXIN) 0.25 MG tablet Take 1 tablet (0.25 mg total) by mouth daily.  30 tablet  3  . furosemide (LASIX) 40 MG tablet Take 1 tablet (40 mg total) by mouth daily.  30 tablet  3  . metoprolol tartrate (LOPRESSOR) 25 MG tablet Take 3 tablets (75 mg total) by mouth 2 (two) times daily.  180 tablet  3  . pantoprazole (PROTONIX) 40 MG tablet Take 1 tablet (40 mg total) by mouth daily.  30 tablet  3  . ramipril (ALTACE) 2.5 MG capsule Take 1 capsule (2.5 mg total) by mouth daily.  30 capsule  3  . Rivaroxaban (XARELTO) 20 MG TABS Take 1 tablet (20 mg total) by mouth daily.  30 tablet  3  . potassium chloride SA (K-DUR,KLOR-CON) 20 MEQ tablet Take 1 tablet (20 mEq total) by  mouth 2 (two) times daily.  60 tablet  3   No current facility-administered medications for this visit.     Past Medical History  Diagnosis Date  . History of atrial fibrillation 2003    Dr. Mauri Tolen  . OSA on CPAP   . Onychomycosis     ROS:   All systems reviewed and negative except as noted in the HPI.   Past Surgical History  Procedure Laterality Date  . Mandible surgery      underbite correction  . Cardioversion N/A 08/14/2012    Procedure: CARDIOVERSION;  Surgeon: Alessander R Bensimhon, MD;  Location: [facility] OR;  Service: Cardiovascular;  Laterality: N/A;     Family History  Problem Relation Age of Onset  . Diabetes Mother   . Hypertension Mother   . Depression Father   . Osteoarthritis Father   . Alcohol abuse Father      History   Social History  . Marital Status: Single    Spouse Name: N/A    Number of Children: N/A  . Years of Education: N/A   Occupational History  . car salesman    Social History Main Topics  . Smoking status: Former Smoker  . Smokeless tobacco: Not on file  . Alcohol Use: Yes     Comment: Occasioanl ETOH  . Drug Use: Not on file  . Sexually Active: Yes   Other Topics Concern  . Not on file   Social History Narrative  . No   narrative on file     BP 107/69  Pulse 80  Ht 6' 2" (1.88 m)  Wt 270 lb (122.471 kg)  BMI 34.65 kg/m2  Physical Exam:  Well appearing 31-year-old man,NAD HEENT: Unremarkable Neck:  No JVD, no thyromegally Lungs:  Clear with no wheezes, rales, or rhonchi. HEART:  IRegular rate rhythm, no murmurs, no rubs, no clicks Abd:  soft, positive bowel sounds, no organomegally, no rebound, no guarding Ext:  2 plus pulses, no edema, no cyanosis, no clubbing Skin:  No rashes no nodules Neuro:  CN II through XII intact, motor grossly intact  EKG - atrial flutter with a controlled ventricular response  Assess/Plan: 

## 2012-09-09 ENCOUNTER — Encounter (HOSPITAL_COMMUNITY): Payer: Self-pay

## 2012-09-12 ENCOUNTER — Ambulatory Visit (HOSPITAL_COMMUNITY)
Admit: 2012-09-12 | Discharge: 2012-09-12 | Disposition: A | Discharge status: Home / Self Care | Payer: BC Managed Care – PPO | Attending: Internal Medicine | Admitting: Internal Medicine

## 2012-09-12 ENCOUNTER — Encounter (HOSPITAL_COMMUNITY): Payer: Self-pay

## 2012-09-12 VITALS — BP 140/82 | HR 93 | Wt 272.8 lb

## 2012-09-12 DIAGNOSIS — I5022 Chronic systolic (congestive) heart failure: Secondary | ICD-10-CM

## 2012-09-12 DIAGNOSIS — I5021 Acute systolic (congestive) heart failure: Secondary | ICD-10-CM | POA: Insufficient documentation

## 2012-09-12 DIAGNOSIS — I4891 Unspecified atrial fibrillation: Secondary | ICD-10-CM | POA: Insufficient documentation

## 2012-09-12 DIAGNOSIS — I4892 Unspecified atrial flutter: Secondary | ICD-10-CM | POA: Insufficient documentation

## 2012-09-12 MED ORDER — RAMIPRIL 2.5 MG PO CAPS: 5.0000 mg | ORAL_CAPSULE | Freq: Every day | ORAL | Status: DC

## 2012-09-12 NOTE — Assessment & Plan Note (Addendum)
Will have atrial flutter with Dr. Johney Frame on Wednesday, continue amiodarone and xarelto.  Attending. Agree. For AFL ablation this week. Continue amio and Xarelto. Suspect he will need AF ablation as well in the future. Management per Dr. [name].

## 2012-09-12 NOTE — Patient Instructions (Addendum)
Increase ramipril to 2 tabs daily.  Follow up 1 month.

## 2012-09-12 NOTE — Assessment & Plan Note (Signed)
Continue CPAP use

## 2012-09-12 NOTE — Progress Notes (Signed)
  HPI: [name] is a 32 y.o. AAM with PMH significant for PAF, HTN and OSA who was diagnosed with acute systolic heart failure, EF 10-15% on March 2014.    Admitted 08/2012 and found to be in Afib with RVR.  He was started on IV heparin and diltiazem without improvement in rates.  He was changed to amiodarone and became profoundly hypotensive requiring levophed and IVF.  He underwent TEE and failed DCCV on 3/9.  Dr. [name] placed him on Tikosyn and he had polymorphic VT.  Transitioned to oral amiodarone with good rate response.  Echo during admission showed EF 10% with diffuse hypokinesis.  He was diuresed and discharged on amiodarone, xarelto, digoxin, ramipril, lopressor and lasix.    He returns for post hospital follow up.  He feels good since discharge.  He is scheduled for atrial flutter ablation on Wednesday with Dr. Johney Frame.  He states weight is very stable.  No edema.  Can walk leisurely without trouble.  Has noted increased sweating with hills.  He currently works as a Quarry manager.        ROS: All systems negative except as listed in HPI, PMH and Problem List.  Past Medical History  Diagnosis Date  . History of atrial fibrillation 2003    Dr. [name]  . OSA on CPAP   . Onychomycosis     Current Outpatient Prescriptions  Medication Sig Dispense Refill  . acetaminophen (TYLENOL) 325 MG tablet Take 650 mg by mouth every 6 (six) hours as needed (headache).      Marland Kitchen amiodarone (PACERONE) 400 MG tablet Take 800 mg by mouth 2 (two) times daily.      . digoxin (LANOXIN) 0.25 MG tablet Take 1 tablet (0.25 mg total) by mouth daily.  30 tablet  3  . furosemide (LASIX) 40 MG tablet Take 1 tablet (40 mg total) by mouth daily.  30 tablet  3  . metoprolol tartrate (LOPRESSOR) 25 MG tablet Take 50 mg by mouth 2 (two) times daily.      . ramipril (ALTACE) 2.5 MG capsule Take 1 capsule (2.5 mg total) by mouth daily.  30 capsule  3  . Rivaroxaban (XARELTO) 20 MG TABS Take 1 tablet (20 mg total) by mouth  daily.  30 tablet  3  . tetrahydrozoline-zinc (VISINE-AC) 0.05-0.25 % ophthalmic solution Place 1 drop into both eyes 3 (three) times daily as needed (dry eyes).       No current facility-administered medications for this encounter.  **Only on amiodarone 400 mg BID   PHYSICAL EXAM: Filed Vitals:   09/12/12 1144  BP: 140/82  Pulse: 93  Weight: 272 lb 12.8 oz (123.741 kg)  SpO2: 99%    General:  Well appearing. No resp difficulty HEENT: normal Neck: supple. JVP flat. Carotids 2+ bilaterally; no bruits. No lymphadenopathy or thryomegaly appreciated. Cor: PMI normal. Irregular regular rate & rhythm. No rubs, gallops or murmurs. Lungs: clear Abdomen: soft, nontender, nondistended. No hepatosplenomegaly. No bruits or masses. Good bowel sounds. Extremities: no cyanosis, clubbing, rash, edema Neuro: alert & orientedx3, cranial nerves grossly intact. Moves all 4 extremities w/o difficulty. Affect pleasant.   ASSESSMENT & PLAN:

## 2012-09-12 NOTE — Assessment & Plan Note (Addendum)
NYHA II.  Volume status looks good.  Will increase ramipril 5 mg daily.  Re-educated on low sodium diet and sliding scale lasix.  Will discuss changing lopressor to toprol after ablation.    Patient seen and examined with Ulyess Blossom, PA-C. We discussed all aspects of the encounter. I agree with the assessment and plan as stated above. I performed limited bedside echo today in clinic. Very poor images but EF still seems markedly depressed in 25% range. Symptoms improved. Volume status looks good. Suspect cardiomyopathy rate-related and hopefully will improve with management of AFL./AF. Titrate ramipril. Would consider switching lopressor to Toprol when OK with Dr. [name].

## 2012-09-14 ENCOUNTER — Encounter (HOSPITAL_COMMUNITY)
Admission: RE | Disposition: A | Discharge status: Home / Self Care | Payer: Self-pay | Source: Ambulatory Visit | Attending: Internal Medicine

## 2012-09-14 ENCOUNTER — Encounter (HOSPITAL_COMMUNITY): Payer: Self-pay | Admitting: General Practice

## 2012-09-14 ENCOUNTER — Ambulatory Visit (HOSPITAL_COMMUNITY)
Admission: RE | Admit: 2012-09-14 | Discharge: 2012-09-15 | Disposition: A | Discharge status: Home / Self Care | Payer: BC Managed Care – PPO | Source: Ambulatory Visit | Attending: Internal Medicine | Admitting: Internal Medicine

## 2012-09-14 DIAGNOSIS — I5021 Acute systolic (congestive) heart failure: Secondary | ICD-10-CM

## 2012-09-14 DIAGNOSIS — I4892 Unspecified atrial flutter: Secondary | ICD-10-CM

## 2012-09-14 DIAGNOSIS — I5022 Chronic systolic (congestive) heart failure: Secondary | ICD-10-CM

## 2012-09-14 DIAGNOSIS — I4891 Unspecified atrial fibrillation: Secondary | ICD-10-CM | POA: Insufficient documentation

## 2012-09-14 HISTORY — DX: Acute systolic (congestive) heart failure: I50.21

## 2012-09-14 HISTORY — PX: ATRIAL FLUTTER ABLATION: SHX5733

## 2012-09-14 SURGERY — ATRIAL FLUTTER ABLATION
Anesthesia: LOCAL

## 2012-09-14 MED ORDER — AMIODARONE HCL 200 MG PO TABS
200.0000 mg | ORAL_TABLET | Freq: Two times a day (BID) | ORAL | Status: DC
  Administered 2012-09-14 – 2012-09-15 (×2): 200 mg via ORAL
  Filled 2012-09-14 (×4): qty 1

## 2012-09-14 MED ORDER — ACETAMINOPHEN 325 MG PO TABS
650.0000 mg | ORAL_TABLET | ORAL | Status: DC | PRN
  Administered 2012-09-14 – 2012-09-15 (×2): 650 mg via ORAL
  Filled 2012-09-14 (×2): qty 2

## 2012-09-14 MED ORDER — FENTANYL CITRATE 0.05 MG/ML IJ SOLN
INTRAMUSCULAR | Status: AC
  Filled 2012-09-14: qty 2

## 2012-09-14 MED ORDER — RIVAROXABAN 20 MG PO TABS
20.0000 mg | ORAL_TABLET | Freq: Every day | ORAL | Status: DC
  Filled 2012-09-14: qty 1

## 2012-09-14 MED ORDER — SODIUM CHLORIDE 0.9 % IJ SOLN
3.0000 mL | Freq: Two times a day (BID) | INTRAMUSCULAR | Status: DC
  Administered 2012-09-14 (×2): 3 mL via INTRAVENOUS

## 2012-09-14 MED ORDER — SODIUM CHLORIDE 0.9 % IV SOLN: 250.0000 mL | INTRAVENOUS | Status: DC | PRN

## 2012-09-14 MED ORDER — BUPIVACAINE HCL (PF) 0.25 % IJ SOLN
INTRAMUSCULAR | Status: AC
  Filled 2012-09-14: qty 60

## 2012-09-14 MED ORDER — MIDAZOLAM HCL 5 MG/5ML IJ SOLN
INTRAMUSCULAR | Status: AC
  Filled 2012-09-14: qty 5

## 2012-09-14 MED ORDER — VERAPAMIL HCL 2.5 MG/ML IV SOLN
INTRAVENOUS | Status: AC
  Filled 2012-09-14: qty 2

## 2012-09-14 MED ORDER — METOPROLOL TARTRATE 50 MG PO TABS
50.0000 mg | ORAL_TABLET | Freq: Two times a day (BID) | ORAL | Status: DC
  Administered 2012-09-14 – 2012-09-15 (×2): 50 mg via ORAL
  Filled 2012-09-14 (×4): qty 1

## 2012-09-14 MED ORDER — ONDANSETRON HCL 4 MG/2ML IJ SOLN: 4.0000 mg | Freq: Four times a day (QID) | INTRAMUSCULAR | Status: DC | PRN

## 2012-09-14 MED ORDER — SODIUM CHLORIDE 0.9 % IJ SOLN: 3.0000 mL | INTRAMUSCULAR | Status: DC | PRN

## 2012-09-14 MED ORDER — FUROSEMIDE 40 MG PO TABS
40.0000 mg | ORAL_TABLET | Freq: Every day | ORAL | Status: DC
  Administered 2012-09-14 – 2012-09-15 (×2): 40 mg via ORAL
  Filled 2012-09-14 (×2): qty 1

## 2012-09-14 MED ORDER — HEPARIN (PORCINE) IN NACL 2-0.9 UNIT/ML-% IJ SOLN
INTRAMUSCULAR | Status: AC
  Filled 2012-09-14: qty 500

## 2012-09-14 MED ORDER — RAMIPRIL 5 MG PO CAPS
5.0000 mg | ORAL_CAPSULE | Freq: Every day | ORAL | Status: DC
  Administered 2012-09-15: 5 mg via ORAL
  Filled 2012-09-14: qty 1

## 2012-09-14 MED ORDER — DIGOXIN 250 MCG PO TABS
0.2500 mg | ORAL_TABLET | Freq: Every day | ORAL | Status: DC
  Administered 2012-09-15: 0.25 mg via ORAL
  Filled 2012-09-14: qty 1

## 2012-09-14 NOTE — H&P (View-Only) (Signed)
HPI Lance [name] returns today for followup. He is a pleasant 32 year old man with a history of atrial fibrillation and atrial flutter. The patient initially presented with atrial fibrillation and rapid ventricular sponsor associated with severe left ventricular dysfunction and hypotension. He was placed on intravenous amiodarone, and became profoundly hypotensive. Ultimately, he was restarted on oral amiodarone and underwent cardioversion. At some point, he went from sinus rhythm back into atrial flutter. He is on amiodarone for her milligrams daily. He has minimal palpitations. He also remains on digoxin, Lasix, metoprolol, Avapro, and anticoagulation with Xarelto. The patient breathing is better. He still has some palpitations. Allergies  Allergen Reactions  . Amiodarone Other (See Comments)    IV intolerance with Severe hypotension, shock after amiodarone bolus.  Taking po Amiodarone without problems.  . Metoprolol Succinate Other (See Comments)    REACTION: achy     Current Outpatient Prescriptions  Medication Sig Dispense Refill  . amiodarone (PACERONE) 400 MG tablet Take 1 tab(400mg ) three times daily x1 wk, then 1 tab twice daily x2 wks, then 1 tab once daily x2 wks, then 1/2 tab(200mg ) once daily.  90 tablet  3  . digoxin (LANOXIN) 0.25 MG tablet Take 1 tablet (0.25 mg total) by mouth daily.  30 tablet  3  . furosemide (LASIX) 40 MG tablet Take 1 tablet (40 mg total) by mouth daily.  30 tablet  3  . metoprolol tartrate (LOPRESSOR) 25 MG tablet Take 3 tablets (75 mg total) by mouth 2 (two) times daily.  180 tablet  3  . pantoprazole (PROTONIX) 40 MG tablet Take 1 tablet (40 mg total) by mouth daily.  30 tablet  3  . ramipril (ALTACE) 2.5 MG capsule Take 1 capsule (2.5 mg total) by mouth daily.  30 capsule  3  . Rivaroxaban (XARELTO) 20 MG TABS Take 1 tablet (20 mg total) by mouth daily.  30 tablet  3  . potassium chloride SA (K-DUR,KLOR-CON) 20 MEQ tablet Take 1 tablet (20 mEq total) by  mouth 2 (two) times daily.  60 tablet  3   No current facility-administered medications for this visit.     Past Medical History  Diagnosis Date  . History of atrial fibrillation 2003    Dr. [name]  . OSA on CPAP   . Onychomycosis     ROS:   All systems reviewed and negative except as noted in the HPI.   Past Surgical History  Procedure Laterality Date  . Mandible surgery      underbite correction  . Cardioversion N/A 08/14/2012    Procedure: CARDIOVERSION;  Surgeon: Dolores Patty, MD;  Location: [facility];  Service: Cardiovascular;  Laterality: N/A;     Family History  Problem Relation Age of Onset  . Diabetes Mother   . Hypertension Mother   . Depression Father   . Osteoarthritis Father   . Alcohol abuse Father      History   Social History  . Marital Status: Single    Spouse Name: N/A    Number of Children: N/A  . Years of Education: N/A   Occupational History  . car salesman    Social History Main Topics  . Smoking status: Former Games developer  . Smokeless tobacco: Not on file  . Alcohol Use: Yes     Comment: Occasioanl ETOH  . Drug Use: Not on file  . Sexually Active: Yes   Other Topics Concern  . Not on file   Social History Narrative  . No  narrative on file     BP 107/69  Pulse 80  Ht 6\' 2"  (1.88 m)  Wt 270 lb (122.471 kg)  BMI 34.65 kg/m2  Physical Exam:  Well appearing 32 year old man,NAD HEENT: Unremarkable Neck:  No JVD, no thyromegally Lungs:  Clear with no wheezes, rales, or rhonchi. HEART:  IRegular rate rhythm, no murmurs, no rubs, no clicks Abd:  soft, positive bowel sounds, no organomegally, no rebound, no guarding Ext:  2 plus pulses, no edema, no cyanosis, no clubbing Skin:  No rashes no nodules Neuro:  CN II through XII intact, motor grossly intact  EKG - atrial flutter with a controlled ventricular response  Assess/Plan:

## 2012-09-14 NOTE — Op Note (Signed)
Lance [name], IMEL NO.:  [id number]  MEDICAL RECORD NO.:  [id number]  LOCATION:  [id number]                        FACILITY:  [facility]  PHYSICIAN:  Doylene Canning. [name], MD    DATE OF BIRTH:  Mar 22, 1981  DATE OF PROCEDURE:  09/14/2012 DATE OF DISCHARGE:                              OPERATIVE REPORT   PROCEDURE PERFORMED:  Electrophysiologic study and RF catheter ablation of atrial flutter.  INTRODUCTION:  The patient is a 32 year old man with a history of atrial fibrillation and severe left ventricular dysfunction.  He was placed on amiodarone and cardioverted.  He reverted to sinus rhythm but then developed atrial flutter with controlled ventricular response and is now referred for catheter ablation.  PROCEDURE:  After informed consent was obtained, the patient was taken to the diagnostic EP lab in a fasting state.  After usual preparation and draping, intravenous fentanyl and midazolam were given for sedation. A 6-French hexapolar catheter was inserted percutaneously in the right jugular vein and advanced to the coronary sinus.  A 7-French quadripolar catheter was inserted percutaneously in the right femoral vein and advanced to the right atrium and a 6-French quadripolar catheter was inserted percutaneously into the right femoral vein and advanced to the His bundle region.  After measurement of basic intervals, the 7-French quadripolar ablation catheter was maneuvered into the atrial flutter isthmus and mapping was carried out.  This demonstrated typical counterclockwise tricuspid annular reentrant atrial flutter.  The atrial flutter isthmus was of usual size and orientation.  The ablation catheter was maneuvered onto the ventricular insertion of the tricuspid valve annulus and a total of 3 RF energy applications were delivered at approximately 5 o'clock on the tricuspid valve annulus with RF energy applied between the tricuspid valve annulus and the inferior vena  cava. During the 2nd RF energy application, atrial flutter cycle length prolonged and then the atrial flutter was terminated.  Third RF energy application was delivered and pacing was carried out from the coronary sinus which demonstrated a stimulus atrial activation time of over 200 milliseconds.  Rapid atrial pacing was carried out.  The right atrium demonstrating the very long prolonged stem to A time.  At this time, the stimulus was coming from the lateral portion of the right atrium to the coronary sinus.  With demonstration of documented isthmus block, rapid ventricular pacing was carried out from the right ventricle demonstrating VA dissociation at 600 milliseconds.  Rapid atrial pacing was then carried out demonstrating an AV Wenckebach cycle length of 560 milliseconds.  Programmed atrial stimulation was then carried out at base drive cycle length of 960 milliseconds.  The S1-S2 interval stepwise decreased down to 440 milliseconds with AV node ERP was observed.  During programmed atrial stimulation, there were AH jumps but no echo beats, and no inducible SVT.  After approximately 20 minutes of observation with no evidence of recurrent atrial flutter isthmus conduction, the ablation catheters and the mapping catheters were removed.  Hemostasis was assured and the patient was returned to his room in satisfactory condition.  COMPLICATIONS:  There were no immediate complications for results. A.  Baseline ECG.  Baseline ECG demonstrates atrial flutter with variable AV  conduction. B.  Baseline intervals.  The SP interval was 38 milliseconds.  The QRS duration was 109 milliseconds.  Following catheter ablation, the sinus node cycle length was 855 milliseconds.  The AH interval was 91 milliseconds and the HV interval was 50 milliseconds. C.  Rapid ventricular pacing.  Rapid atrial pacing was carried out from the right ventricle demonstrating VA dissociation. D.  Programmed ventricular  stimulation.  Programmed ventricular stimulation was carried out from the right ventricle demonstrating VA dissociation at 600 milliseconds. E.  Rapid atrial pacing.  Rapid atrial pacing was carried out. Following ablation from the atrium and demonstrated an AV Wenckebach cycle length of 560 milliseconds.  During rapid atrial pacing, the PR interval was equal to the RR interval, but there was no inducible SVT. F.  Programmed atrial stimulation.  Programmed atrial stimulation was carried out from the atrium at base drive cycle of 119 milliseconds. The S1-S2 interval stepwise decreased down to 440 milliseconds with AV node ERP was observed.  During programmed atrial stimulation, there were no A's.  There multiple AH jumps but no echo beats, and no inducible SVT. G.  Arrhythmias observed.  Atrial flutter initiation present at the time of EP study, the duration was sustained.  Cycle length was 280 milliseconds.  Method of termination was with catheter ablation. H.  Mapping.  Mapping of atrial flutter isthmus demonstrated normal size and orientation. 1. RF energy application.  A total of 3 RF energy applications were     delivered to the atrial flutter isthmus resulting in termination of     flutter, restoration of sinus rhythm, creation of bidirectional     block and atrial flutter isthmus.  CONCLUSION:  This study demonstrates successful electrophysiologic study and RF catheter ablation of typical atrial flutter with a total of 3 RF energy applications delivered to the atrial flutter isthmus.  There were no immediate procedure complications.     Doylene Canning. [name], MD     GWT/MEDQ  D:  09/14/2012  T:  09/14/2012  Job:  [id number]

## 2012-09-14 NOTE — Op Note (Signed)
EPS/RFA of atrial flutter performed without immediate complication. [id number].

## 2012-09-14 NOTE — Interval H&P Note (Signed)
History and Physical Interval Note:  09/14/2012 7:21 AM  Lance [name]  has presented today for surgery, with the diagnosis of aflutter  The various methods of treatment have been discussed with the patient and family. After consideration of risks, benefits and other options for treatment, the patient has consented to  Procedure(s): ATRIAL FLUTTER ABLATION (N/A) as a surgical intervention .  The patient's history has been reviewed, patient examined, no change in status, stable for surgery.  I have reviewed the patient's chart and labs.  Questions were answered to the patient's satisfaction.     [name]

## 2012-09-15 MED ORDER — AMIODARONE HCL 200 MG PO TABS: 200.0000 mg | ORAL_TABLET | Freq: Two times a day (BID) | ORAL | Status: DC

## 2012-09-15 NOTE — Progress Notes (Signed)
   ELECTROPHYSIOLOGY ROUNDING NOTE    Patient Name: Lance [name] Date of Encounter: 09-15-2012    SUBJECTIVE:Patient feels well.  No chest pain or shortness of breath.  S/p RFCA of atrial flutter 09-14-2012  TELEMETRY: Reviewed telemetry pt in sinus rhythm Filed Vitals:   09/14/12 1300 09/14/12 1400 09/14/12 1503 09/14/12 2055  BP: 112/56 105/60 105/60 114/68  Pulse: 68 71 69 74  Temp:   97.6 F (36.4 C) 97.3 F (36.3 C)  TempSrc:   Oral Oral  Resp:   16 18  Height:      Weight:      SpO2:   100% 100%    Intake/Output Summary (Last 24 hours) at 09/15/12 1191 Last data filed at 09/14/12 2300  Gross per 24 hour  Intake    355 ml  Output   1350 ml  Net   -995 ml    PHYSICAL EXAM Right groin and neck incision without complication  Wound care, restrictions reviewed with patient.  Patient has follow up scheduled with CHF clinic in early May.  Routine follow up scheduled with Dr [name].   EP Attending  Agree with above. Leonia Reeves.D.

## 2012-09-15 NOTE — Progress Notes (Signed)
Pt discharged to home per MD order. Pt received and reviewed all discharge instructions and medication information including follow-up appointments and prescriptions, including groin site care. Pt verbalized understanding. Pt alert and oriented at discharge with no complaints of pain. Pt escorted to private vehicle via wheelchair by guest services. Efraim Kaufmann

## 2012-09-15 NOTE — Discharge Summary (Signed)
ELECTROPHYSIOLOGY PROCEDURE DISCHARGE SUMMARY    Patient ID: Lance [name],  MRN: [id number], DOB/AGE: 07/17/1980 32 y.o.  Admit date: 09/14/2012 Discharge date: 09/15/2012  Primary Care Physician: Lance Primes, MD Primary Cardiologist: Lance Mango, MD Electrophysiologist: Lance Bunting, MD  Primary Discharge Diagnosis:  Atrial flutter status post ablation this admission  Secondary Discharge Diagnosis:  1.  Atrial fibrillation 2.  Hypertension 3.  Sleep apnea 4.  Chronic systolic heart failure- class II- suspected to be rate related.  Procedures This Admission:  1.  Electrophysiology study and radiofrequency catheter ablation of atrial flutter on 09-14-2012 by Dr Lance [name].  This demonstrated successful electrophysiologic study and RF catheter ablation of typical atrial flutter with a total of 3 RF energy applications delivered to the atrial flutter isthmus. There were no immediate procedure complications.  Brief HPI: Lance [name] is a 32 y.o. AAM with PMH significant for PAF, HTN and OSA who was diagnosed with acute systolic heart failure, EF 10-15% on March 2014.  He was admitted 08/2012 and found to be in Afib with RVR. He was started on IV heparin and diltiazem without improvement in rates. He was changed to amiodarone and became profoundly hypotensive requiring levophed and IVF. He underwent TEE and failed DCCV on 3/9. Dr. Ladona [name] placed him on Tikosyn and he had polymorphic VT. Transitioned to oral amiodarone with good rate response. Echo during admission showed EF 10% with diffuse hypokinesis. He was diuresed and discharged on amiodarone, xarelto, digoxin, ramipril, lopressor and lasix. He was scheduled for atrial flutter ablation with Dr Lance [name] electively. He states weight is very stable. No edema. Can walk leisurely without trouble. Has noted increased sweating with hills. He currently works as a Quarry manager.   Hospital Course:  The patient was admitted and underwent electrophysiology  study and radiofrequency catheter ablation with details as outlined above.  He was monitored on telemetry overnight which demonstrated sinus rhythm.  His right neck and groin incisions were without complication.  Dr Lance [name] examined the patient and considered him stable for discharge to home with follow up in the heart failure clinic (scheduled for 5-6).  His Amiodarone dose was decreased at discharge.   Discharge Vitals: Blood pressure 122/72, pulse 62, temperature 97.9 F (36.6 C), temperature source Oral, resp. rate 18, height 6\' 2"  (1.88 m), weight 267 lb (121.11 kg), SpO2 95.00%.    Labs:   Lab Results  Component Value Date   WBC 6.0 09/08/2012   HGB 15.2 09/08/2012   HCT 44.7 09/08/2012   MCV 90.2 09/08/2012   PLT 250.0 09/08/2012     Recent Labs Lab 09/08/12 1249  NA 134*  K 4.9  CL 100  CO2 27  BUN 14  CREATININE 1.2  CALCIUM 9.4  GLUCOSE 96    Discharge Medications:    Medication List    TAKE these medications       amiodarone 200 MG tablet  Commonly known as:  PACERONE  Take 1 tablet (200 mg total) by mouth 2 (two) times daily.     digoxin 0.25 MG tablet  Commonly known as:  LANOXIN  Take 1 tablet (0.25 mg total) by mouth daily.     furosemide 40 MG tablet  Commonly known as:  LASIX  Take 1 tablet (40 mg total) by mouth daily.     metoprolol tartrate 25 MG tablet  Commonly known as:  LOPRESSOR  Take 50 mg by mouth 2 (two) times daily.     ramipril 2.5 MG capsule  Commonly known as:  ALTACE  Take 2 capsules (5 mg total) by mouth daily.     Rivaroxaban 20 MG Tabs  Commonly known as:  XARELTO  Take 1 tablet (20 mg total) by mouth daily.     tetrahydrozoline-zinc 0.05-0.25 % ophthalmic solution  Commonly known as:  VISINE-AC  Place 1 drop into both eyes 3 (three) times daily as needed (dry eyes).        Disposition:  Discharge Orders   Future Appointments Provider Department Dept Phone   10/12/2012 11:30 AM [facility] [phone]   10/19/2012 3:45 PM Lance Maw, MD [facility] Main [facility] [place]) [phone]   Future Orders Complete By Expires     Diet - low sodium heart healthy  As directed     Increase activity slowly  As directed         Duration of Discharge Encounter: Greater than 30 minutes including physician time.  Signed, Lance Balsam, RN, BSN 09/15/2012, 9:22 AM  EP Attending  Agree with above. Lance [name].D.

## 2012-09-16 ENCOUNTER — Encounter: Payer: Self-pay | Admitting: Internal Medicine

## 2012-09-19 ENCOUNTER — Telehealth: Payer: Self-pay | Admitting: Internal Medicine

## 2012-09-19 ENCOUNTER — Other Ambulatory Visit: Payer: Self-pay | Admitting: *Deleted

## 2012-09-19 DIAGNOSIS — I4891 Unspecified atrial fibrillation: Secondary | ICD-10-CM

## 2012-09-19 NOTE — Telephone Encounter (Signed)
Discussed with Dr Johney Frame, pt in Afib. Will increase his Amiodarone to 400mg  bid and have him set up for a DCCV on Mon.  He will come to the office for EKG prior to the DCCV

## 2012-09-19 NOTE — Telephone Encounter (Signed)
lmom for pt to return my call. 

## 2012-09-19 NOTE — Telephone Encounter (Signed)
TCM call to patient no answer.LMTC. 

## 2012-09-19 NOTE — Telephone Encounter (Signed)
New Prob    Pt feels he is currently in afib. Feels it started last night. Would like to speak to nurse regarding this.

## 2012-09-19 NOTE — Telephone Encounter (Signed)
Last night, felt like he went into afib

## 2012-09-20 ENCOUNTER — Other Ambulatory Visit: Payer: Self-pay | Admitting: *Deleted

## 2012-09-20 NOTE — Telephone Encounter (Signed)
To Kelly

## 2012-09-22 ENCOUNTER — Telehealth: Payer: Self-pay | Admitting: Internal Medicine

## 2012-09-22 NOTE — Telephone Encounter (Signed)
New Prob      Pt states he has been in and out of rhythm and would like to speak to nurse.

## 2012-09-22 NOTE — Telephone Encounter (Signed)
lmtcb

## 2012-09-23 ENCOUNTER — Encounter (HOSPITAL_COMMUNITY): Payer: Self-pay | Admitting: Pharmacist

## 2012-09-23 NOTE — Telephone Encounter (Signed)
New Prob   Would like to cancel the cardio conversion for Monday. Would like to speak to nurse regarding this.  Pt states when calls come through to his phone, it shows up as unavailable.

## 2012-09-23 NOTE — Telephone Encounter (Signed)
LMTCB

## 2012-09-26 ENCOUNTER — Encounter (HOSPITAL_COMMUNITY): Admission: RE | Payer: Self-pay | Source: Ambulatory Visit

## 2012-09-26 ENCOUNTER — Ambulatory Visit (HOSPITAL_COMMUNITY)
Admission: RE | Admit: 2012-09-26 | Payer: BC Managed Care – PPO | Source: Ambulatory Visit | Admitting: Internal Medicine

## 2012-09-26 SURGERY — CARDIOVERSION
Anesthesia: Monitor Anesthesia Care

## 2012-09-26 NOTE — Telephone Encounter (Signed)
Hospital had never been notified.  I called and canceled the DCCV this morning and have left a message for the patient to return my call

## 2012-09-26 NOTE — Telephone Encounter (Signed)
lmom again for pt

## 2012-09-26 NOTE — Telephone Encounter (Signed)
Still trying to reach patient  Left another message

## 2012-09-26 NOTE — Telephone Encounter (Signed)
Patient is going in and out of rhythm and is feeling better.  Trying to go back to work and is doing ok but will call if goes back out of rhythm and stays out

## 2012-09-26 NOTE — Telephone Encounter (Signed)
Attempted to call patient.  Unable to LM on voicemail of # listed for home phone. Attempted work # - business not open yet

## 2012-09-26 NOTE — Telephone Encounter (Signed)
Follow Up    Pt calling in to speak to nurse regarding his procedure he was scheduled to have today.

## 2012-09-29 ENCOUNTER — Telehealth: Payer: Self-pay | Admitting: *Deleted

## 2012-09-29 NOTE — Telephone Encounter (Signed)
lmom for patient to stop Digoxin and call me to let me know he received the message

## 2012-10-06 ENCOUNTER — Encounter: Payer: Self-pay | Admitting: *Deleted

## 2012-10-06 MED ORDER — AMIODARONE HCL 200 MG PO TABS: ORAL_TABLET | ORAL | Status: DC

## 2012-10-07 ENCOUNTER — Telehealth: Payer: Self-pay | Admitting: *Deleted

## 2012-10-07 NOTE — Telephone Encounter (Signed)
Refill done.  

## 2012-10-12 ENCOUNTER — Encounter (HOSPITAL_COMMUNITY): Payer: Self-pay

## 2012-10-12 ENCOUNTER — Ambulatory Visit (HOSPITAL_COMMUNITY)
Admission: RE | Admit: 2012-10-12 | Discharge: 2012-10-12 | Disposition: A | Discharge status: Home / Self Care | Payer: BC Managed Care – PPO | Source: Ambulatory Visit | Attending: Internal Medicine | Admitting: Internal Medicine

## 2012-10-12 VITALS — BP 138/76 | HR 55 | Wt 271.8 lb

## 2012-10-12 DIAGNOSIS — I4891 Unspecified atrial fibrillation: Secondary | ICD-10-CM

## 2012-10-12 DIAGNOSIS — I5022 Chronic systolic (congestive) heart failure: Secondary | ICD-10-CM

## 2012-10-12 LAB — BASIC METABOLIC PANEL
Chloride: 104 mEq/L (ref 96–112)
Creatinine, Ser: 1.03 mg/dL (ref 0.50–1.35)
GFR calc Af Amer: >90 mL/min (ref >90)
Potassium: 4 mEq/L (ref 3.5–5.1)
Sodium: 138 mEq/L (ref 135–145)

## 2012-10-12 MED ORDER — RAMIPRIL 5 MG PO CAPS: 5.0000 mg | ORAL_CAPSULE | Freq: Every day | ORAL | Status: DC

## 2012-10-12 MED ORDER — FUROSEMIDE 40 MG PO TABS: 40.0000 mg | ORAL_TABLET | ORAL | Status: DC | PRN

## 2012-10-12 NOTE — Progress Notes (Signed)
Patient ID: RETT [name], male   DOB: 14-Jan-1981, 32 y.o.   MRN: [id number] PCP: EP: Dr [name]  HPI: Lance [name] is a 32 y.o. AAM with PMH significant for PAF, HTN, OSA, Chronic Systolic Heart Failure - EF 10-15% 08/2012, and A Fib.     Admitted 08/2012 and found to be in Afib with RVR.  He was started on IV heparin and diltiazem without improvement in rates.  He was changed to amiodarone and became profoundly hypotensive requiring levophed and IVF.  He underwent TEE and failed DCCV on 3/9.  Dr. [name] placed him on Tikosyn and he had polymorphic VT.  Transitioned to oral amiodarone with good rate response.  Echo during admission showed EF 10% with diffuse hypokinesis.  He was diuresed and discharged on amiodarone, xarelto, digoxin, ramipril, lopressor and lasix.    09/14/12 S/P A-Flutter ablation.   Bedside ECHO performed by Dr [name]. EF normal  He returns for follow up.  Complains of fatigue. Complains of pressure in his head 4-5 times.  Denies SOB/PND/Orthopnea. Does complain of light headedness going up steps. Uses CPAP 50% of the time. Weight at home 266-270 pounds. He does not take extra lasix. He says he feels like he is in and out of A fib. He currently works as a Quarry manager.  He is not on metoprolol succinate due to body aches.        ROS: All systems negative except as listed in HPI, PMH and Problem List.  Past Medical History  Diagnosis Date  . History of atrial fibrillation 2003    Dr. [name]  . OSA on CPAP   . Onychomycosis   . Acute systolic heart failure 09/14/2012  . Dysrhythmia     atrial flutter    Current Outpatient Prescriptions  Medication Sig Dispense Refill  . acetaminophen (TYLENOL) 500 MG tablet Take 1,000 mg by mouth daily as needed (for Headaches).      Marland Kitchen amiodarone (PACERONE) 200 MG tablet Take 2 in the am and 2 in the pm Pt has a follow up on 5/23 hope to decrease then  120 tablet  1  . furosemide (LASIX) 40 MG tablet Take 1 tablet (40 mg total) by mouth  daily.  30 tablet  3  . metoprolol tartrate (LOPRESSOR) 25 MG tablet Take 25 mg by mouth 2 (two) times daily. Take 1 tab in am and 2 tabs in pm      . ramipril (ALTACE) 2.5 MG capsule Take 2 capsules (5 mg total) by mouth daily.  30 capsule  3  . Rivaroxaban (XARELTO) 20 MG TABS Take 1 tablet (20 mg total) by mouth daily.  30 tablet  3  . tetrahydrozoline-zinc (VISINE-AC) 0.05-0.25 % ophthalmic solution Place 1 drop into both eyes 3 (three) times daily as needed (dry eyes).       No current facility-administered medications for this encounter.     PHYSICAL EXAM: Filed Vitals:   10/12/12 1131  BP: 138/76  Pulse: 55  Weight: 271 lb 12.8 oz (123.288 kg)  SpO2: 100%    General:  Well appearing. No resp difficulty HEENT: normal Neck: supple. JVP flat. Carotids 2+ bilaterally; no bruits. No lymphadenopathy or thryomegaly appreciated. Cor: PMI normal. Irregular regular rate & rhythm. No rubs, gallops or murmurs. Lungs: clear Abdomen: soft, nontender, nondistended. No hepatosplenomegaly. No bruits or masses. Good bowel sounds. Extremities: no cyanosis, clubbing, rash, edema Neuro: alert & orientedx3, cranial nerves grossly intact. Moves all 4 extremities w/o difficulty. Affect  pleasant.   ASSESSMENT & PLAN:

## 2012-10-12 NOTE — Patient Instructions (Addendum)
Follow up in 3 months  Take lasix as needed

## 2012-10-12 NOTE — Assessment & Plan Note (Addendum)
Bedside ECHO performed by Dr [name]. EF recovered. Change lasix to as needed. Continue current medications. Check BMET today. Follow up in 3 months.   Patient seen and examined with Lance Becket, NP. We discussed all aspects of the encounter. I agree with the assessment and plan as stated above.  He is much improved with minimal HF symptoms. Volume status looks good. I did bedside echo personally in clinic and LV function has normalized. Will change lasix to prn as he has been having orthostasis. Otherwise continue current meds.

## 2012-10-14 ENCOUNTER — Telehealth: Payer: Self-pay | Admitting: Internal Medicine

## 2012-10-14 NOTE — Telephone Encounter (Signed)
New problem   Per pt h/a getting worst

## 2012-10-14 NOTE — Telephone Encounter (Signed)
Advised patient to follow up with his PCP for headaches

## 2012-10-17 NOTE — Assessment & Plan Note (Addendum)
Followed by Dr. [name]. He has the sense that he is going in and out of AF. Currently in sinus on amio. Continue anticoagulation. May need a monitor. Will defer to Dr. [name].

## 2012-10-19 ENCOUNTER — Encounter: Payer: BC Managed Care – PPO | Admitting: Internal Medicine

## 2012-10-21 ENCOUNTER — Telehealth: Payer: Self-pay | Admitting: Internal Medicine

## 2012-10-21 NOTE — Telephone Encounter (Signed)
lmom for pt, samples out front

## 2012-10-21 NOTE — Telephone Encounter (Signed)
New problem    Pt needs samples of xarelto

## 2012-10-28 ENCOUNTER — Ambulatory Visit (INDEPENDENT_AMBULATORY_CARE_PROVIDER_SITE_OTHER): Payer: BC Managed Care – PPO | Admitting: Internal Medicine

## 2012-10-28 ENCOUNTER — Encounter: Payer: Self-pay | Admitting: Internal Medicine

## 2012-10-28 VITALS — BP 114/57 | HR 55 | Ht 74.0 in | Wt 271.4 lb

## 2012-10-28 DIAGNOSIS — I5022 Chronic systolic (congestive) heart failure: Secondary | ICD-10-CM

## 2012-10-28 DIAGNOSIS — I4891 Unspecified atrial fibrillation: Secondary | ICD-10-CM

## 2012-10-28 MED ORDER — AMIODARONE HCL 200 MG PO TABS: ORAL_TABLET | ORAL | Status: DC

## 2012-10-28 NOTE — Patient Instructions (Signed)
Your physician recommends that you schedule a follow-up appointment in: end of 01/2013 with Dr [name]   Your physician has recommended you make the following change in your medication:  1) Continue Amiodarone 200mg --take 2 tablets twice daily for 3 weeks, then decrease to 2 tablets in am and 1 in pm until 01/06/13, then decrease to 1 tablet twice daily

## 2012-10-28 NOTE — Progress Notes (Signed)
HPI Mr. Iiams returns today for followup. 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 The patient was treated with oral amiodarone, and developed atrial flutter for which he underwent catheter ablation several months ago. In March of 2014, his ejection fraction was 10% by echo. After catheter ablation and amiodarone therapy, repeat echocardiography demonstrated normalization of his left ventricular function. He has required high doses of amiodarone in order to maintain sinus rhythm. He notes that his heart was out of rhythm daily but only for 10-20 minutes. He has had no syncope. He denies chest pain or shortness of breath. No cough. His only complaint on amiodarone his photosensitivity. Allergies  Allergen Reactions  . Amiodarone Other (See Comments)    IV intolerance with Severe hypotension, shock after amiodarone bolus.  Taking po Amiodarone without problems.  . Fish Allergy     Not shell fish - regular fish causes tingling  . Metoprolol Succinate Other (See Comments)    Body aches at high doses.  Currently takes two tablets of lopressor 25mg  twice daily; three tablets twice daily caused him to ache.     Current Outpatient Prescriptions  Medication Sig Dispense Refill  . acetaminophen (TYLENOL) 500 MG tablet Take 1,000 mg by mouth daily as needed (for Headaches).      Marland Kitchen amiodarone (PACERONE) 200 MG tablet Take 2 in the am and 2 in the pm  120 tablet  3  . amiodarone (PACERONE) 200 MG tablet Take 2 in the am and 2 in the pm Pt has a follow up on 5/23 hope to decrease then  120 tablet  1  . furosemide (LASIX) 40 MG tablet Take 1 tablet (40 mg total) by mouth as needed.  30 tablet  3  . metoprolol tartrate (LOPRESSOR) 25 MG tablet Take 25 mg by mouth 2 (two) times daily. Take 1 tab in am and 2 tabs in pm      . ramipril (ALTACE) 5 MG capsule Take 1 capsule (5 mg total) by mouth daily.  30 capsule   3  . Rivaroxaban (XARELTO) 20 MG TABS Take 1 tablet (20 mg total) by mouth daily.  30 tablet  3  . tetrahydrozoline-zinc (VISINE-AC) 0.05-0.25 % ophthalmic solution Place 1 drop into both eyes 3 (three) times daily as needed (dry eyes).       No current facility-administered medications for this visit.     Past Medical History  Diagnosis Date  . History of atrial fibrillation 2003    Dr. [name]  . OSA on CPAP   . Onychomycosis   . Acute systolic heart failure 09/14/2012  . Dysrhythmia     atrial flutter    ROS:   All systems reviewed and negative except as noted in the HPI.   Past Surgical History  Procedure Laterality Date  . Mandible surgery      underbite correction  . Cardioversion N/A 08/14/2012    Procedure: CARDIOVERSION;  Surgeon: Dolores Patty, MD;  Location: [facility] OR;  Service: Cardiovascular;  Laterality: N/A;  . Ablation of dysrhythmic focus  09/14/2012     Family History  Problem Relation Age of Onset  . Diabetes Mother   . Hypertension Mother   . Depression Father   . Osteoarthritis Father   . Alcohol abuse Father      History   Social History  . Marital Status: Single    Spouse Name: N/A    Number  of Children: N/A  . Years of Education: N/A   Occupational History  . car salesman    Social History Main Topics  . Smoking status: Former Smoker    Quit date: 05/12/2009  . Smokeless tobacco: Never Used  . Alcohol Use: Yes     Comment: Occasioanl ETOH  . Drug Use: No  . Sexually Active: Yes   Other Topics Concern  . Not on file   Social History Narrative  . No narrative on file     BP 114/57  Pulse 55  Ht 6\' 2"  (1.88 m)  Wt 271 lb 6.4 oz (123.106 kg)  BMI 34.83 kg/m2  Physical Exam:  Well appearing 32 year old man, NAD HEENT: Unremarkable Neck:  7 cm JVD, no thyromegally Lymphatics:  No adenopathy Back:  No CVA tenderness Lungs:  Clear with no wheezes, rales, or rhonchi. HEART:  Regular rate rhythm, no murmurs, no rubs, no  clicks Abd:  soft, positive bowel sounds, no organomegally, no rebound, no guarding Ext:  2 plus pulses, no edema, no cyanosis, no clubbing Skin:  No rashes no nodules Neuro:  CN II through XII intact, motor grossly intact  EKG Normal sinus rhythm with normal axis and intervals. QRS duration is 118 ms. Assess/Plan:

## 2012-10-28 NOTE — Assessment & Plan Note (Signed)
The patient's left ventricular function has apparently normalized. Today he asked about whether he needs to continue taking his heart failure medications. I will defer the decision on this to his primary cardiologist.

## 2012-10-28 NOTE — Assessment & Plan Note (Signed)
The patient is maintaining sinus rhythm. He will continue high dose amiodarone for now.  I've asked the patient to reduce his dose of amiodarone to 600 mg daily in the middle June, and then to decrease further to 400 mg daily on August 1.  I will see him back in approximately 3 months. He is instructed to call us if he has recurrent atrial arrhythmias.

## 2012-11-03 ENCOUNTER — Encounter: Payer: BC Managed Care – PPO | Admitting: Internal Medicine

## 2012-11-29 ENCOUNTER — Telehealth: Payer: Self-pay | Admitting: Internal Medicine

## 2012-11-29 NOTE — Telephone Encounter (Signed)
New Prob     Pt would like samples of XARELTO for one month.

## 2012-11-30 NOTE — Telephone Encounter (Signed)
Samples outfront 

## 2013-01-11 ENCOUNTER — Telehealth: Payer: Self-pay | Admitting: Internal Medicine

## 2013-01-11 NOTE — Telephone Encounter (Signed)
New Prob  Pt would like samples of Xarelto 20 mgs

## 2013-01-12 NOTE — Telephone Encounter (Signed)
Follow up  Pt is calling regarding the samples of Xarelto.

## 2013-01-12 NOTE — Telephone Encounter (Signed)
Samples outfront 

## 2013-01-19 ENCOUNTER — Telehealth: Payer: Self-pay | Admitting: Internal Medicine

## 2013-01-19 NOTE — Telephone Encounter (Signed)
I have called the rep to bring in samples and will let patient know when they are here.

## 2013-01-19 NOTE — Telephone Encounter (Signed)
New problem   Pt calling because samples of xarelto were not at the front office and he wants to know if you can leave samples at the front

## 2013-01-20 NOTE — Telephone Encounter (Signed)
Samples placed at the front desk for pt to pick-up.  I left a voicemail on his cell phone that samples have been placed at the front desk.

## 2013-01-23 ENCOUNTER — Encounter: Payer: Self-pay | Admitting: Internal Medicine

## 2013-01-23 DIAGNOSIS — Z0000 Encounter for general adult medical examination without abnormal findings: Secondary | ICD-10-CM

## 2013-01-30 ENCOUNTER — Encounter (HOSPITAL_COMMUNITY): Payer: Self-pay | Admitting: Cardiology

## 2013-01-30 ENCOUNTER — Telehealth (HOSPITAL_COMMUNITY): Payer: Self-pay | Admitting: Cardiology

## 2013-01-30 NOTE — Telephone Encounter (Signed)
Attempting to call pt to schedule 3 month follow up/ due September I have been unable to reach this patient by phone.  A letter is being sent to the last known home address.

## 2013-02-01 ENCOUNTER — Ambulatory Visit (INDEPENDENT_AMBULATORY_CARE_PROVIDER_SITE_OTHER): Payer: BC Managed Care – PPO | Admitting: Internal Medicine

## 2013-02-01 ENCOUNTER — Other Ambulatory Visit (INDEPENDENT_AMBULATORY_CARE_PROVIDER_SITE_OTHER): Payer: BC Managed Care – PPO

## 2013-02-01 ENCOUNTER — Encounter: Payer: Self-pay | Admitting: Internal Medicine

## 2013-02-01 ENCOUNTER — Other Ambulatory Visit: Payer: Self-pay | Admitting: *Deleted

## 2013-02-01 VITALS — BP 111/72 | HR 68 | Ht 74.0 in | Wt 267.4 lb

## 2013-02-01 DIAGNOSIS — I4891 Unspecified atrial fibrillation: Secondary | ICD-10-CM

## 2013-02-01 DIAGNOSIS — I5022 Chronic systolic (congestive) heart failure: Secondary | ICD-10-CM

## 2013-02-01 DIAGNOSIS — Z7721 Contact with and (suspected) exposure to potentially hazardous body fluids: Secondary | ICD-10-CM

## 2013-02-01 DIAGNOSIS — Z0000 Encounter for general adult medical examination without abnormal findings: Secondary | ICD-10-CM

## 2013-02-01 LAB — CBC WITH DIFFERENTIAL/PLATELET
Basophils Absolute: 0 10*3/uL (ref 0.0–0.1)
Eosinophils Absolute: 0.1 10*3/uL (ref 0.0–0.7)
Lymphocytes Relative: 25.1 % (ref 12.0–46.0)
MCHC: 34.3 g/dL (ref 30.0–36.0)
Monocytes Relative: 9.6 % (ref 3.0–12.0)
Neutrophils Relative %: 63.2 % (ref 43.0–77.0)
Platelets: 222 10*3/uL (ref 150.0–400.0)
RDW: 13.5 % (ref 11.5–14.6)

## 2013-02-01 LAB — URINALYSIS, ROUTINE W REFLEX MICROSCOPIC
Bilirubin Urine: NEGATIVE
Ketones, ur: NEGATIVE
Leukocytes, UA: NEGATIVE
Nitrite: NEGATIVE
RBC / HPF: NONE SEEN (ref 0–?)
Specific Gravity, Urine: 1.025 (ref 1.000–1.030)
Total Protein, Urine: NEGATIVE
pH: 6 (ref 5.0–8.0)

## 2013-02-01 NOTE — Assessment & Plan Note (Signed)
His chronic systolic heart failure is well compensated. He is behaving as if his left ventricular function has improved. We'll plan a 2-D echo. He will followup with Dr. Teressa Lower.

## 2013-02-01 NOTE — Patient Instructions (Signed)
Your physician recommends that you schedule a follow-up appointment with Dr Johney Frame to discuss afib ablation   Your physician has requested that you have an echocardiogram. Echocardiography is a painless test that uses sound waves to create images of your heart. It provides your doctor with information about the size and shape of your heart and how well your heart's chambers and valves are working. This procedure takes approximately one hour. There are no restrictions for this procedure.

## 2013-02-01 NOTE — Progress Notes (Signed)
HPI Lance [name] returns today for followup. He is a very pleasant 32 year old man who developed acute systolic heart failure , thought to be tachycardia mediated, from atrial fibrillation and flutter. 5 months ago, his ejection fraction was 10% and he was in shock. Medical therapy followed by catheter ablation of atrial flutter followed by the initiation of amiodarone for atrial fibrillation has improved his symptoms markedly. He has had recurrent atrial fibrillation, despite amiodarone. He is currently on 400 mg daily, and notes that he goes in and out of atrial fibrillation. In the interim, he denies chest pain but does note occasional dyspnea. No peripheral edema. No syncope, or hemoptysis. No cough. He is tolerating his medical therapy for congestive heart failure very nicely. He is pending a repeat 2-D echo. Allergies  Allergen Reactions  . Amiodarone Other (See Comments)    IV intolerance with Severe hypotension, shock after amiodarone bolus.  Taking po Amiodarone without problems.  . Fish Allergy     Not shell fish - regular fish causes tingling  . Metoprolol Succinate Other (See Comments)    Body aches at high doses.  Currently takes two tablets of lopressor 25mg  twice daily; three tablets twice daily caused him to ache.     Current Outpatient Prescriptions  Medication Sig Dispense Refill  . acetaminophen (TYLENOL) 500 MG tablet Take 1,000 mg by mouth daily as needed (for Headaches).      Marland Kitchen amiodarone (PACERONE) 200 MG tablet Take 2 in the am and 2 in the pm Pt has a follow up on 5/23 hope to decrease then  120 tablet  1  . furosemide (LASIX) 40 MG tablet Take 1 tablet (40 mg total) by mouth as needed.  30 tablet  3  . metoprolol tartrate (LOPRESSOR) 25 MG tablet Take 25 mg by mouth 2 (two) times daily. Take 1 tab in am and 2 tabs in pm      . ramipril (ALTACE) 5 MG capsule Take 1 capsule (5 mg total) by mouth daily.  30 capsule  3  . Rivaroxaban (XARELTO) 20 MG TABS Take 1 tablet (20 mg  total) by mouth daily.  30 tablet  3  . tetrahydrozoline-zinc (VISINE-AC) 0.05-0.25 % ophthalmic solution Place 1 drop into both eyes 3 (three) times daily as needed (dry eyes).       No current facility-administered medications for this visit.     Past Medical History  Diagnosis Date  . History of atrial fibrillation 2003    Dr. [name]  . OSA on CPAP   . Onychomycosis   . Acute systolic heart failure 09/14/2012  . Dysrhythmia     atrial flutter    ROS:   All systems reviewed and negative except as noted in the HPI.   Past Surgical History  Procedure Laterality Date  . Mandible surgery      underbite correction  . Cardioversion N/A 08/14/2012    Procedure: CARDIOVERSION;  Surgeon: Dolores Patty, MD;  Location: [facility] OR;  Service: Cardiovascular;  Laterality: N/A;  . Ablation of dysrhythmic focus  09/14/2012     Family History  Problem Relation Age of Onset  . Diabetes Mother   . Hypertension Mother   . Depression Father   . Osteoarthritis Father   . Alcohol abuse Father      History   Social History  . Marital Status: Single    Spouse Name: N/A    Number of Children: N/A  . Years of Education: N/A   Occupational  History  . car salesman    Social History Main Topics  . Smoking status: Former Smoker    Quit date: 05/12/2009  . Smokeless tobacco: Never Used  . Alcohol Use: Yes     Comment: Occasioanl ETOH  . Drug Use: No  . Sexual Activity: Yes   Other Topics Concern  . Not on file   Social History Narrative  . No narrative on file     BP 111/72  Pulse 68  Ht 6\' 2"  (1.88 m)  Wt 267 lb 6.4 oz (121.292 kg)  BMI 34.32 kg/m2  Physical Exam:  Well appearing 32 year old man, NAD HEENT: Unremarkable Neck:  7 cm JVD, no thyromegally Back:  No CVA tenderness Lungs:  Clear with no wheezes, rales, or rhonchi. HEART:  Regular rate rhythm, no murmurs, no rubs, no clicks Abd:  soft, positive bowel sounds, no organomegally, no rebound, no  guarding Ext:  2 plus pulses, no edema, no cyanosis, no clubbing Skin:  No rashes no nodules Neuro:  CN II through XII intact, motor grossly intact  EKG - in atrial fibrillation with a controlled ventricular response.  Assess/Plan:

## 2013-02-01 NOTE — Assessment & Plan Note (Signed)
The patient continues to have paroxysms of atrial fibrillation, despite amiodarone therapy. I've recommended referral to Dr. Johney Frame for consideration of catheter ablation of his atrial fibrillation. He'll continue his current anticoagulation.

## 2013-02-02 LAB — COMPREHENSIVE METABOLIC PANEL
ALT: 56 U/L — ABNORMAL HIGH (ref 0–53)
AST: 33 U/L (ref 0–37)
CO2: 29 mEq/L (ref 19–32)
Chloride: 105 mEq/L (ref 96–112)
Creatinine, Ser: 1 mg/dL (ref 0.4–1.5)
Sodium: 138 mEq/L (ref 135–145)
Total Bilirubin: 0.6 mg/dL (ref 0.3–1.2)
Total Protein: 7.2 g/dL (ref 6.0–8.3)

## 2013-02-02 LAB — TSH: TSH: 1.24 u[IU]/mL (ref 0.35–5.50)

## 2013-02-02 LAB — LIPID PANEL
Cholesterol: 153 mg/dL (ref 0–200)
HDL: 46 mg/dL (ref >39.00)
VLDL: 13.2 mg/dL (ref 0.0–40.0)

## 2013-02-02 LAB — HIV ANTIBODY (ROUTINE TESTING W REFLEX): HIV: NONREACTIVE

## 2013-02-02 LAB — PSA: PSA: 0.45 ng/mL (ref 0.10–4.00)

## 2013-02-03 ENCOUNTER — Encounter: Payer: Self-pay | Admitting: Internal Medicine

## 2013-02-03 ENCOUNTER — Ambulatory Visit (INDEPENDENT_AMBULATORY_CARE_PROVIDER_SITE_OTHER): Payer: BC Managed Care – PPO | Admitting: Internal Medicine

## 2013-02-03 VITALS — BP 100/64 | HR 68 | Temp 98.2°F | Resp 16 | Ht 74.0 in | Wt 269.0 lb

## 2013-02-03 DIAGNOSIS — I4891 Unspecified atrial fibrillation: Secondary | ICD-10-CM

## 2013-02-03 DIAGNOSIS — Z9989 Dependence on other enabling machines and devices: Secondary | ICD-10-CM

## 2013-02-03 DIAGNOSIS — I5022 Chronic systolic (congestive) heart failure: Secondary | ICD-10-CM

## 2013-02-03 DIAGNOSIS — Z0000 Encounter for general adult medical examination without abnormal findings: Secondary | ICD-10-CM

## 2013-02-03 DIAGNOSIS — G4733 Obstructive sleep apnea (adult) (pediatric): Secondary | ICD-10-CM

## 2013-02-03 MED ORDER — PODOFILOX 0.5 % EX GEL: Freq: Two times a day (BID) | CUTANEOUS | Status: DC

## 2013-02-03 MED ORDER — VITAMIN D 1000 UNITS PO TABS: 1000.0000 [IU] | ORAL_TABLET | Freq: Every day | ORAL | Status: DC

## 2013-02-03 NOTE — Assessment & Plan Note (Signed)
We discussed age appropriate health related issues, including available/recomended screening tests and vaccinations. We discussed a need for adhering to healthy diet and exercise. Labs/EKG were reviewed/ordered. All questions were answered.   

## 2013-02-03 NOTE — Progress Notes (Signed)
Subjective:    HPI  The patient is here for a wellness exam. The patient needs to address  chronic hypertension that has been well controlled with medicines; to address chronic  A fib controlled with medicines as well; and to address h/o CHF, controlled with medical treatment.  F/u OSA  Not using CPAP  Wt Readings from Last 3 Encounters:  02/03/13 269 lb (122.018 kg)  02/01/13 267 lb 6.4 oz (121.292 kg)  10/28/12 271 lb 6.4 oz (123.106 kg)   BP Readings from Last 3 Encounters:  02/03/13 100/64  02/01/13 111/72  10/28/12 114/57      Review of Systems  Constitutional: Negative for appetite change, fatigue and unexpected weight change.  HENT: Positive for hearing loss. Negative for nosebleeds, congestion, sore throat, sneezing, trouble swallowing and neck pain.   Eyes: Negative for itching and visual disturbance.  Respiratory: Negative for cough and stridor.   Cardiovascular: Negative for chest pain, palpitations and leg swelling.  Gastrointestinal: Negative for nausea, diarrhea, blood in stool and abdominal distention.  Genitourinary: Negative for frequency and hematuria.  Musculoskeletal: Negative for myalgias, back pain, joint swelling and gait problem.  Skin: Negative for rash.  Neurological: Negative for dizziness, tremors, speech difficulty and weakness.  Psychiatric/Behavioral: Negative for suicidal ideas, sleep disturbance, dysphoric mood and agitation. The patient is not nervous/anxious.        Objective:   Physical Exam  Constitutional: He is oriented to person, place, and time. He appears well-developed. No distress.  NAD Obese  HENT:  Mouth/Throat: Oropharynx is clear and moist.  Eyes: Conjunctivae are normal. Pupils are equal, round, and reactive to light.  Neck: Normal range of motion. No JVD present. No thyromegaly present.  Cardiovascular: Normal rate and intact distal pulses.  Exam reveals no gallop and no friction rub.   No murmur heard. irreg irreg   Pulmonary/Chest: Effort normal and breath sounds normal. No respiratory distress. He has no wheezes. He has no rales. He exhibits no tenderness.  Abdominal: Soft. Bowel sounds are normal. He exhibits no distension and no mass. There is no tenderness. There is no rebound and no guarding.  Genitourinary: Penis normal.  1 wart on penile shaft L and 1 on scrotum L  Musculoskeletal: Normal range of motion. He exhibits edema (R ankle is a little swollen w/varicies). He exhibits no tenderness.  Lymphadenopathy:    He has no cervical adenopathy.  Neurological: He is alert and oriented to person, place, and time. He has normal reflexes. No cranial nerve deficit. He exhibits normal muscle tone. He displays a negative Romberg sign. Coordination and gait normal.  No meningeal signs  Skin: Skin is warm and dry. No rash noted.  Psychiatric: He has a normal mood and affect. His behavior is normal. Judgment and thought content normal.      Lab Results  Component Value Date   WBC 5.6 02/01/2013   HGB 14.1 02/01/2013   HCT 41.1 02/01/2013   PLT 222.0 02/01/2013   GLUCOSE 79 02/01/2013   CHOL 153 02/01/2013   TRIG 66.0 02/01/2013   HDL 46.00 02/01/2013   LDLCALC 94 02/01/2013   ALT 56* 02/01/2013   AST 33 02/01/2013   NA 138 02/01/2013   K 4.3 02/01/2013   CL 105 02/01/2013   CREATININE 1.0 02/01/2013   BUN 16 02/01/2013   CO2 29 02/01/2013   TSH 1.24 02/01/2013   PSA 0.45 02/01/2013   INR 1.46 08/22/2012       Assessment & Plan:

## 2013-02-03 NOTE — Assessment & Plan Note (Signed)
Continue with current prescription therapy as reflected on the Med list.  

## 2013-02-03 NOTE — Assessment & Plan Note (Signed)
Due to A fib 2014

## 2013-02-03 NOTE — Assessment & Plan Note (Signed)
Risks associated with treatment noncompliance were discussed. Compliance was encouraged. 

## 2013-02-03 NOTE — Patient Instructions (Addendum)
[email or web address] Ramipril may be causing cough

## 2013-02-17 ENCOUNTER — Other Ambulatory Visit (HOSPITAL_COMMUNITY): Payer: BC Managed Care – PPO

## 2013-02-21 ENCOUNTER — Encounter (HOSPITAL_COMMUNITY): Payer: Self-pay | Admitting: *Deleted

## 2013-03-03 ENCOUNTER — Ambulatory Visit (HOSPITAL_COMMUNITY): Payer: BC Managed Care – PPO | Attending: Cardiology | Admitting: Radiology

## 2013-03-03 DIAGNOSIS — I4892 Unspecified atrial flutter: Secondary | ICD-10-CM

## 2013-03-03 DIAGNOSIS — I509 Heart failure, unspecified: Secondary | ICD-10-CM

## 2013-03-03 DIAGNOSIS — I4891 Unspecified atrial fibrillation: Secondary | ICD-10-CM | POA: Insufficient documentation

## 2013-03-03 DIAGNOSIS — I5021 Acute systolic (congestive) heart failure: Secondary | ICD-10-CM

## 2013-03-03 NOTE — Progress Notes (Signed)
Echocardiogram performed.  

## 2013-03-04 ENCOUNTER — Other Ambulatory Visit (HOSPITAL_COMMUNITY): Payer: Self-pay | Admitting: Adult Health

## 2013-03-07 ENCOUNTER — Ambulatory Visit (HOSPITAL_COMMUNITY)
Admission: RE | Admit: 2013-03-07 | Discharge: 2013-03-07 | Disposition: A | Discharge status: Home / Self Care | Payer: BC Managed Care – PPO | Source: Ambulatory Visit | Attending: Cardiology | Admitting: Cardiology

## 2013-03-07 ENCOUNTER — Encounter (HOSPITAL_COMMUNITY): Payer: BC Managed Care – PPO

## 2013-03-07 VITALS — BP 116/54 | HR 63 | Wt 279.2 lb

## 2013-03-07 DIAGNOSIS — I4891 Unspecified atrial fibrillation: Secondary | ICD-10-CM | POA: Insufficient documentation

## 2013-03-07 DIAGNOSIS — I5022 Chronic systolic (congestive) heart failure: Secondary | ICD-10-CM | POA: Insufficient documentation

## 2013-03-07 DIAGNOSIS — R0781 Pleurodynia: Secondary | ICD-10-CM

## 2013-03-07 DIAGNOSIS — R071 Chest pain on breathing: Secondary | ICD-10-CM | POA: Insufficient documentation

## 2013-03-07 NOTE — Progress Notes (Signed)
Patient ID: BRAVE DACK, male   DOB: Dec 29, 1980, 32 y.o.   MRN: [id number]  PCP: Dr. Posey Rea EP: Dr [name]  HPI: Mr. Inskeep is a 32 y.o. AAM with PMH significant for PAF, HTN, OSA, Chronic Systolic Heart Failure - EF 10-15% 08/2012, and A Fib.     Admitted 08/2012 and found to be in Afib with RVR.  He was started on IV heparin and diltiazem without improvement in rates.  He was changed to amiodarone and became profoundly hypotensive requiring levophed and IVF.  He underwent TEE and failed DCCV on 3/9.  Dr. [name] placed him on Tikosyn and he had polymorphic VT.  Transitioned to oral amiodarone with good rate response.  Echo during admission showed EF 10% with diffuse hypokinesis.  He was diuresed and discharged on amiodarone, xarelto, digoxin, ramipril, lopressor and lasix.    09/14/12 S/P A-Flutter ablation.   Bedside ECHO performed by Dr [name]. EF normal (10/2012)  Follow up: Last visit changed lasix to PRN. Has taken lasix 1 time. Reports last month has noticed a little more discomfort taking deep breath. Denies SOB/PND/Orthopnea. Does complain of light headedness going up steps or with frequent movement changes. Not wearing CPAP at night d/t fask mask does not fit well. He has follow up with Pulmonology next week. Weight at home 273-275 lbs. Works at [employer] now. Feels like he is in and out of atrial fibrillation: has palpitations lasting for 5 minutes to an hour several times a day.  The palpitations do not seem to be at a fast rate like they have been in the past.  He is in NSR currently.   Echo (9/14) with EF 50%, mild LVH, moderate diastolic dysfunction, normal RV.   Labs (8/14): HIV negative, TSH normal, K 4.3, creatinine 1.0, LFTs normal  He is not on metoprolol succinate due to body aches.     ROS: All systems negative except as listed in HPI, PMH and Problem List.  Past Medical History  Diagnosis Date  . History of atrial fibrillation 2003    Dr. [name]  . OSA on CPAP   .  Onychomycosis   . Acute systolic heart failure 09/14/2012  . Dysrhythmia     atrial flutter    Current Outpatient Prescriptions  Medication Sig Dispense Refill  . acetaminophen (TYLENOL) 500 MG tablet Take 1,000 mg by mouth daily as needed (for Headaches).      Marland Kitchen amiodarone (PACERONE) 200 MG tablet Take 200 mg by mouth 2 (two) times daily.      . cholecalciferol (VITAMIN D) 1000 UNITS tablet Take 1 tablet (1,000 Units total) by mouth daily.  100 tablet  3  . furosemide (LASIX) 40 MG tablet Take 1 tablet (40 mg total) by mouth as needed.  30 tablet  3  . metoprolol tartrate (LOPRESSOR) 25 MG tablet Take 1 tab in am and 2 tabs in pm      . podofilox (CONDYLOX) 0.5 % gel Apply topically 2 (two) times daily. As directed  3.5 g  1  . ramipril (ALTACE) 5 MG capsule TAKE ONE CAPSULE BY MOUTH ONCE DAILY  30 capsule  1  . Rivaroxaban (XARELTO) 20 MG TABS Take 1 tablet (20 mg total) by mouth daily.  30 tablet  3  . tetrahydrozoline-zinc (VISINE-AC) 0.05-0.25 % ophthalmic solution Place 1 drop into both eyes 3 (three) times daily as needed (dry eyes).       No current facility-administered medications for this encounter.    Filed  Vitals:   03/07/13 1405  BP: 116/54  Pulse: 63  Weight: 279 lb 4 oz (126.667 kg)  SpO2: 99%   PHYSICAL EXAM: General:  Well appearing. No resp difficulty HEENT: normal Neck: supple. JVP flat. Carotids 2+ bilaterally; no bruits. No lymphadenopathy or thryomegaly appreciated. Cor: PMI normal. Irregular regular rate & rhythm. No rubs, gallops or murmurs. Lungs: clear Abdomen: soft, nontender, nondistended. No hepatosplenomegaly. No bruits or masses. Good bowel sounds. Extremities: no cyanosis, clubbing, rash, edema Neuro: alert & orientedx3, cranial nerves grossly intact. Moves all 4 extremities w/o difficulty. Affect pleasant.  ASSESSMENT & PLAN:  1) Chronic systolic HF: Suspect tachycardia-mediated cardiomyopathy.  Most recent echo shows improvement in EF to 50%  with grade II diastolic dysfunction (02/2013).  NYHA class I-II symptoms, not volume overloaded.  - Will continue metoprolol tartrate and ramipril. If EF starts to decrease again will need to switch from metoprolol to coreg, however he is tolerating metoprolol tartrate well right now.  2) Afib: Paroxysmal with frequent symptomatic episodes despite amiodarone use.   - Given symptoms plus episode of probable tachycardia-mediated cardiomyopathy, I would agree with atrial fibrillation ablation.  He will see Dr. Johney Frame.  - Continue Xarelto  - Will continue amiodarone 200 mg BID. Had TSH and LFTs checked and were nl (01/2013), recently saw his eye doctor  3) HTN  - Controlled, continue current medications  4) OSA: Continue CPAP.   5) Chest pain: Very mild pleuritic chest pain noted.  Given amiodarone use, will check PFTs.   F/U 4 months  Ulla Potash B 3:03 PM  Patient seen with NP, agree with the above note.  Reviewed echo today, EF improved.  Suspect tachy-mediated CMP.  I think atrial fibrillation ablation would be a good idea for a more long-term solution (hopefully can eventually stop amiodarone).  Continue Xarelto.  Will check PFTs given amiodarone use and mild pleuritic-type chest discomfort.   Marca Ancona 03/08/2013

## 2013-03-07 NOTE — Patient Instructions (Addendum)
Continue current medications  Your physician has recommended that you have a pulmonary function test. Pulmonary Function Tests are a group of tests that measure how well air moves in and out of your lungs.  We will contact you in 4 months to schedule your next appointment.

## 2013-03-10 ENCOUNTER — Institutional Professional Consult (permissible substitution): Payer: BC Managed Care – PPO | Admitting: Pulmonary Disease

## 2013-03-17 ENCOUNTER — Encounter (HOSPITAL_COMMUNITY): Payer: BC Managed Care – PPO

## 2013-03-17 ENCOUNTER — Institutional Professional Consult (permissible substitution): Payer: BC Managed Care – PPO | Admitting: Pulmonary Disease

## 2013-03-29 ENCOUNTER — Ambulatory Visit (INDEPENDENT_AMBULATORY_CARE_PROVIDER_SITE_OTHER): Payer: BC Managed Care – PPO | Admitting: Internal Medicine

## 2013-03-29 ENCOUNTER — Encounter: Payer: Self-pay | Admitting: Internal Medicine

## 2013-03-29 VITALS — BP 116/74 | HR 67 | Ht 74.0 in | Wt 281.8 lb

## 2013-03-29 DIAGNOSIS — Z9989 Dependence on other enabling machines and devices: Secondary | ICD-10-CM

## 2013-03-29 DIAGNOSIS — G4733 Obstructive sleep apnea (adult) (pediatric): Secondary | ICD-10-CM

## 2013-03-29 DIAGNOSIS — I5022 Chronic systolic (congestive) heart failure: Secondary | ICD-10-CM

## 2013-03-29 DIAGNOSIS — I4891 Unspecified atrial fibrillation: Secondary | ICD-10-CM

## 2013-03-29 DIAGNOSIS — I959 Hypotension, unspecified: Secondary | ICD-10-CM

## 2013-03-29 NOTE — Patient Instructions (Signed)
Once you have had time to think about ablation please call Dennis Bast (Allred's nurse) with your decision.

## 2013-03-29 NOTE — Progress Notes (Signed)
Primary Care Physician: Sonda Primes, MD Primary EP:  Dr Andrena Mews Lance [name] is a 32 y.o. male with a h/o persistent afib and tachy mediated cardiomyopathy who presents today for further afib management.  He is well known to Dr [name].  He is s/p prior ablation by Dr [name].  Several months ago, his ejection fraction was 10% and he was in shock in the setting of afib with RVR.  He has been placed on amiodarone for atrial fibrillation has improved his symptoms markedly. Unfortunately, he has had recurrent atrial fibrillation, despite amiodarone. He is currently on 400 mg daily, and notes that he goes in and out of atrial fibrillation. In the interim, he denies chest pain but does note occasional dyspnea. No peripheral edema. No syncope, or hemoptysis. No cough. He is tolerating his medical therapy for congestive heart failure very nicely. His EF has improved.   Past Medical History  Diagnosis Date  . History of atrial fibrillation 2003    Dr. [name]  . OSA on CPAP   . Onychomycosis   . Acute systolic heart failure 09/14/2012  . Atrial flutter     s/p ablation by Dr [name]   Past Surgical History  Procedure Laterality Date  . Mandible surgery      underbite correction  . Cardioversion N/A 08/14/2012    Procedure: CARDIOVERSION;  Surgeon: Dolores Patty, MD;  Location: [facility] OR;  Service: Cardiovascular;  Laterality: N/A;  . Atrial flutter ablation  09/14/2012    by Dr [name]    Current Outpatient Prescriptions  Medication Sig Dispense Refill  . acetaminophen (TYLENOL) 500 MG tablet Take 1,000 mg by mouth daily as needed (for Headaches).      Marland Kitchen amiodarone (PACERONE) 200 MG tablet Take 200 mg by mouth 2 (two) times daily.      . furosemide (LASIX) 40 MG tablet Take 1 tablet (40 mg total) by mouth as needed.  30 tablet  3  . metoprolol tartrate (LOPRESSOR) 25 MG tablet Take 1 tab in am and 2 tabs in pm      . ramipril (ALTACE) 5 MG capsule TAKE ONE CAPSULE BY MOUTH ONCE DAILY  30  capsule  1  . Rivaroxaban (XARELTO) 20 MG TABS Take 1 tablet (20 mg total) by mouth daily.  30 tablet  3  . tetrahydrozoline-zinc (VISINE-AC) 0.05-0.25 % ophthalmic solution Place 1 drop into both eyes 3 (three) times daily as needed (dry eyes).      . cholecalciferol (VITAMIN D) 1000 UNITS tablet Take 1 tablet (1,000 Units total) by mouth daily.  100 tablet  3  . podofilox (CONDYLOX) 0.5 % gel Apply topically 2 (two) times daily. As directed  3.5 g  1   No current facility-administered medications for this visit.    Allergies  Allergen Reactions  . Amiodarone Other (See Comments)    IV intolerance with Severe hypotension, shock after amiodarone bolus.  Taking po Amiodarone without problems.  . Fish Allergy     Not shell fish - regular fish causes tingling  . Metoprolol Succinate Other (See Comments)    Body aches at high doses.  Currently takes two tablets of lopressor 25mg  twice daily; three tablets twice daily caused him to ache.    History   Social History  . Marital Status: Single    Spouse Name: N/A    Number of Children: N/A  . Years of Education: N/A   Occupational History  . car salesman  Social History Main Topics  . Smoking status: Former Smoker    Quit date: 05/12/2009  . Smokeless tobacco: Never Used  . Alcohol Use: Yes     Comment: Occasioanl ETOH  . Drug Use: No  . Sexual Activity: Yes   Other Topics Concern  . Not on file   Social History Narrative  . No narrative on file    Family History  Problem Relation Age of Onset  . Diabetes Mother   . Hypertension Mother   . Depression Father   . Osteoarthritis Father   . Alcohol abuse Father     ROS- All systems are reviewed and negative except as per the HPI above  Physical Exam: Filed Vitals:   03/29/13 1356  BP: 116/74  Pulse: 67  Height: 6\' 2"  (1.88 m)  Weight: 281 lb 12.8 oz (127.824 kg)    GEN- The patient is overweight appearing, alert and oriented x 3 today.   Head- normocephalic,  atraumatic Eyes-  Sclera clear, conjunctiva pink Ears- hearing intact Oropharynx- clear Neck- supple, no JVP Lymph- no cervical lymphadenopathy Lungs- Clear to ausculation bilaterally, normal work of breathing Heart- Regular rate and rhythm, no murmurs, rubs or gallops, PMI not laterally displaced GI- soft, NT, ND, + BS Extremities- no clubbing, cyanosis, or edema MS- no significant deformity or atrophy Skin- no rash or lesion Psych- euthymic mood, full affect Neuro- strength and sensation are intact  EKG today reveals sinus rhythm 67 bpm, PR 158, rsR/, LA[facility] Echo is reviewed Dr Bruna Potter notes and prior ablation op note are reviewed today  Assessment and Plan:  1. Persistent afib The patient has symptomatic persistent afib though he is now more paroxysmal on amiodarone.  He is s/p prior atrial flutter ablation by Dr [name].  He did not have evidence of accessory pathways at that time.   Therapeutic strategies for afib including medicine and ablation were discussed in detail with the patient today. Risk, benefits, and alternatives to EP study and radiofrequency ablation for afib were also discussed in detail today. These risks include but are not limited to stroke, bleeding, vascular damage, tamponade, perforation, damage to the esophagus, lungs, and other structures, pulmonary vein stenosis, worsening renal function, and death. The patient understands these risk and wishes to proceed.  He is not sure when he is able to proceed due to insurance/ work restraints.  He will contact my office when he decides to proceed  2. OSA Compliance with CPAP is advised.  3. Nonischemic CM Rate control/ rhythm control is important long term.   He will contact my office when he decides to proceed with ablation.  He will follow-up with Dr [name] in the interim.

## 2013-03-31 ENCOUNTER — Ambulatory Visit (HOSPITAL_COMMUNITY)
Admission: RE | Admit: 2013-03-31 | Discharge: 2013-03-31 | Disposition: A | Discharge status: Home / Self Care | Payer: BC Managed Care – PPO | Source: Ambulatory Visit | Attending: Cardiology | Admitting: Cardiology

## 2013-03-31 DIAGNOSIS — Z87891 Personal history of nicotine dependence: Secondary | ICD-10-CM | POA: Insufficient documentation

## 2013-03-31 DIAGNOSIS — R0781 Pleurodynia: Secondary | ICD-10-CM

## 2013-03-31 DIAGNOSIS — I4891 Unspecified atrial fibrillation: Secondary | ICD-10-CM | POA: Insufficient documentation

## 2013-03-31 MED ORDER — ALBUTEROL SULFATE (5 MG/ML) 0.5% IN NEBU
2.5000 mg | INHALATION_SOLUTION | Freq: Once | RESPIRATORY_TRACT | Status: AC
  Administered 2013-03-31: 2.5 mg via RESPIRATORY_TRACT

## 2013-04-28 ENCOUNTER — Encounter: Payer: Self-pay | Admitting: Pulmonary Disease

## 2013-04-28 ENCOUNTER — Ambulatory Visit (INDEPENDENT_AMBULATORY_CARE_PROVIDER_SITE_OTHER): Payer: BC Managed Care – PPO | Admitting: Pulmonary Disease

## 2013-04-28 VITALS — BP 132/76 | HR 67 | Temp 97.7°F | Ht 74.75 in | Wt 289.0 lb

## 2013-04-28 DIAGNOSIS — G4733 Obstructive sleep apnea (adult) (pediatric): Secondary | ICD-10-CM

## 2013-04-28 NOTE — Progress Notes (Signed)
Subjective:    Patient ID: Lance [name], male    DOB: 22-Jun-1980, 32 y.o.   MRN: [id number]  HPI  The patient is a 32 year old male who been asked to see for management of obstructive sleep apnea.  He apparently was diagnosed in 2003 with severe OSA, and was started on CPAP.  He did well initially with the device, but then began to have tolerance issues and a lot of air gulping with abdominal distention.  He has used the device only intermittent since that time.  He was recently in the hospital with an episode of congestive heart failure, and noted to have witnessed apnea.  He was started on a bilevel device, and did extremely well with this.  Currently, he continues to snore, and has nonrestorative sleep at least 50% of the mornings.  He has definite sleep pressure during the day, especially after lunch, but has no issues in the evening.  He has no issues driving.  He tells me that he can fall asleep at a sports bar while watching sporting events.  His weight is increased at least 30 pounds since 2003, and his Epworth score today is 11.  Sleep Questionnaire What time do you typically go to bed?( Between what hours) 11p 11p at 1107 on 04/28/13 by Maisie Fus, CMA How long does it take you to fall asleep? within minutes within minutes at 1107 on 04/28/13 by Maisie Fus, CMA How many times during the night do you wake up? 1 1 at 1107 on 04/28/13 by Maisie Fus, CMA What time do you get out of bed to start your day? [phone] at 1107 on 04/28/13 by Maisie Fus, CMA Do you drive or operate heavy machinery in your occupation? No No at 1107 on 04/28/13 by Maisie Fus, CMA How much has your weight changed (up or down) over the past two years? (In pounds) 10 lb (4.536 kg)10 lb (4.536 kg) increased at 1107 on 04/28/13 by Maisie Fus, CMA Have you ever had a sleep study before? Yes Yes at 1107 on 04/28/13 by Maisie Fus, CMA If yes, location of study? [facility] at 1107 on  04/28/13 by Maisie Fus, CMA If yes, date of study? 2003 2003 at 1107 on 04/28/13 by Maisie Fus, CMA Do you currently use CPAP? Yes Yes at 1107 on 04/28/13 by Maisie Fus, CMA If so, what pressure? unsure unsure at 1107 on 04/28/13 by Maisie Fus, CMA Do you wear oxygen at any time? No    Review of Systems  Constitutional: Negative for fever and unexpected weight change.  HENT: Negative for congestion, dental problem, ear pain, nosebleeds, postnasal drip, rhinorrhea, sinus pressure, sneezing, sore throat and trouble swallowing.   Eyes: Negative for redness and itching.  Respiratory: Positive for shortness of breath ( at rest). Negative for cough, chest tightness and wheezing.   Cardiovascular: Positive for palpitations ( irregular heartbeat). Negative for leg swelling.  Gastrointestinal: Negative for nausea and vomiting.  Genitourinary: Negative for dysuria.  Musculoskeletal: Negative for joint swelling.  Skin: Negative for rash.  Neurological: Positive for headaches.  Hematological: Does not bruise/bleed easily.  Psychiatric/Behavioral: Negative for dysphoric mood. The patient is not nervous/anxious.        Objective:   Physical Exam Constitutional:  Obese male, no acute distress  HENT:  Nares patent without discharge  Oropharynx without exudate, palate and uvula are elongated  Eyes:  Perrla, eomi, no scleral icterus  Neck:  No JVD, no TMG  Cardiovascular:  Normal rate, ?regular rhythm, no rubs or gallops.  No murmurs        Intact distal pulses  Pulmonary :  Normal breath sounds, no stridor or respiratory distress   No rales, rhonchi, or wheezing  Abdominal:  Soft, nondistended, bowel sounds present.  No tenderness noted.   Musculoskeletal:  + lower extremity edema noted.  Lymph Nodes:  No cervical lymphadenopathy noted  Skin:  No cyanosis noted  Neurologic:  Alert, appropriate, moves all 4 extremities without obvious deficit.          Assessment & Plan:

## 2013-04-28 NOTE — Assessment & Plan Note (Signed)
The patient has a history of significant sleep apnea, and has had intolerance to CPAP despite a good effort.  He has underlying heart issues, and was recently in the hospital with decompensated congestive heart failure.  It will be critical to treat his sleep apnea aggressively because of this, and he responded very well to a bilevel device while in the hospital.  His machine is over 34 years old, and obviously needs to be replaced.  We'll therefore try to get him a bilevel device so that week and aggressively treat his sleep apnea.  I have also encouraged him to work aggressively on weight loss.

## 2013-04-28 NOTE — Patient Instructions (Signed)
Will see if we can get you a bipap machine for your sleep apnea.  Please call if having tolerance issues. Work on weight loss followup with me in 8 weeks, and bring machine with you.

## 2013-05-26 ENCOUNTER — Telehealth: Payer: Self-pay

## 2013-05-26 MED ORDER — RAMIPRIL 5 MG PO CAPS: 5.0000 mg | ORAL_CAPSULE | Freq: Every day | ORAL | Status: DC

## 2013-05-26 NOTE — Telephone Encounter (Signed)
Received call from patient stating he needed refill for ramipril.Refill sent to pharmacy.

## 2013-05-30 ENCOUNTER — Other Ambulatory Visit (HOSPITAL_COMMUNITY): Payer: Self-pay | Admitting: *Deleted

## 2013-05-30 MED ORDER — RAMIPRIL 5 MG PO CAPS: 5.0000 mg | ORAL_CAPSULE | Freq: Every day | ORAL | Status: DC

## 2013-07-27 ENCOUNTER — Ambulatory Visit (INDEPENDENT_AMBULATORY_CARE_PROVIDER_SITE_OTHER): Payer: BC Managed Care – PPO | Admitting: Internal Medicine

## 2013-07-27 ENCOUNTER — Other Ambulatory Visit (INDEPENDENT_AMBULATORY_CARE_PROVIDER_SITE_OTHER): Payer: BC Managed Care – PPO

## 2013-07-27 ENCOUNTER — Ambulatory Visit (INDEPENDENT_AMBULATORY_CARE_PROVIDER_SITE_OTHER)
Admission: RE | Admit: 2013-07-27 | Discharge: 2013-07-27 | Disposition: A | Discharge status: Home / Self Care | Payer: BC Managed Care – PPO | Source: Ambulatory Visit | Attending: Internal Medicine | Admitting: Internal Medicine

## 2013-07-27 ENCOUNTER — Encounter: Payer: Self-pay | Admitting: Internal Medicine

## 2013-07-27 ENCOUNTER — Telehealth: Payer: Self-pay | Admitting: Internal Medicine

## 2013-07-27 VITALS — BP 120/70 | HR 80 | Temp 98.6°F | Resp 16 | Wt 299.0 lb

## 2013-07-27 DIAGNOSIS — M545 Low back pain, unspecified: Secondary | ICD-10-CM

## 2013-07-27 DIAGNOSIS — R1013 Epigastric pain: Secondary | ICD-10-CM

## 2013-07-27 LAB — HEPATIC FUNCTION PANEL
ALK PHOS: 40 U/L (ref 39–117)
ALT: 36 U/L (ref 0–53)
AST: 26 U/L (ref 0–37)
Albumin: 4.3 g/dL (ref 3.5–5.2)
BILIRUBIN DIRECT: 0.1 mg/dL (ref 0.0–0.3)
BILIRUBIN TOTAL: 0.4 mg/dL (ref 0.3–1.2)
Total Protein: 7.7 g/dL (ref 6.0–8.3)

## 2013-07-27 LAB — URINALYSIS
Bilirubin Urine: NEGATIVE
Hgb urine dipstick: NEGATIVE
Ketones, ur: NEGATIVE
Leukocytes, UA: NEGATIVE
Nitrite: NEGATIVE
PH: 5.5 (ref 5.0–8.0)
Total Protein, Urine: NEGATIVE
URINE GLUCOSE: NEGATIVE
Urobilinogen, UA: 0.2 (ref 0.0–1.0)

## 2013-07-27 LAB — CBC WITH DIFFERENTIAL/PLATELET
BASOS ABS: 0 10*3/uL (ref 0.0–0.1)
Basophils Relative: 0.3 % (ref 0.0–3.0)
EOS ABS: 0.1 10*3/uL (ref 0.0–0.7)
Eosinophils Relative: 1.6 % (ref 0.0–5.0)
HCT: 44.3 % (ref 39.0–52.0)
Hemoglobin: 14.7 g/dL (ref 13.0–17.0)
LYMPHS ABS: 1.4 10*3/uL (ref 0.7–4.0)
Lymphocytes Relative: 22.4 % (ref 12.0–46.0)
MCHC: 33.2 g/dL (ref 30.0–36.0)
MCV: 92.5 fl (ref 78.0–100.0)
Monocytes Absolute: 0.5 10*3/uL (ref 0.1–1.0)
Monocytes Relative: 8.6 % (ref 3.0–12.0)
Neutro Abs: 4.2 10*3/uL (ref 1.4–7.7)
Neutrophils Relative %: 67.1 % (ref 43.0–77.0)
PLATELETS: 237 10*3/uL (ref 150.0–400.0)
RBC: 4.8 Mil/uL (ref 4.22–5.81)
RDW: 13.8 % (ref 11.5–14.6)
WBC: 6.3 10*3/uL (ref 4.5–10.5)

## 2013-07-27 LAB — LIPASE: Lipase: 30 U/L (ref 11.0–59.0)

## 2013-07-27 LAB — BASIC METABOLIC PANEL
BUN: 12 mg/dL (ref 6–23)
CALCIUM: 9.8 mg/dL (ref 8.4–10.5)
CO2: 27 meq/L (ref 19–32)
Chloride: 105 mEq/L (ref 96–112)
Creatinine, Ser: 1 mg/dL (ref 0.4–1.5)
GFR: 113.8 mL/min (ref >60.00)
GLUCOSE: 101 mg/dL — AB (ref 70–99)
Potassium: 4.7 mEq/L (ref 3.5–5.1)
Sodium: 139 mEq/L (ref 135–145)

## 2013-07-27 LAB — SEDIMENTATION RATE: Sed Rate: 10 mm/hr (ref 0–22)

## 2013-07-27 LAB — AMYLASE: AMYLASE: 49 U/L (ref 27–131)

## 2013-07-27 MED ORDER — TRAMADOL HCL 50 MG PO TABS: 50.0000 mg | ORAL_TABLET | Freq: Two times a day (BID) | ORAL | Status: DC | PRN

## 2013-07-27 NOTE — Assessment & Plan Note (Signed)
2/15 [facility] LBP vs other - ie pancreatitis X ray Labs Tramadol prn

## 2013-07-27 NOTE — Progress Notes (Signed)
Subjective:    Back Pain This is a new problem. The current episode started in the past 7 days. The problem occurs constantly. The problem has been waxing and waning since onset. The pain is present in the lumbar spine and thoracic spine. The quality of the pain is described as aching. The pain does not radiate. The pain is severe. The pain is the same all the time. The symptoms are aggravated by bending, lying down and standing. Pertinent negatives include no chest pain, pelvic pain or weakness. He has tried nothing for the symptoms. The treatment provided no relief.    F/u chronic  A fib controlled with medicines as well; and to address h/o CHF, controlled with medical treatment.  F/u OSA  Not using CPAP  Wt Readings from Last 3 Encounters:  07/27/13 299 lb (135.626 kg)  04/28/13 289 lb (131.09 kg)  03/29/13 281 lb 12.8 oz (127.824 kg)   BP Readings from Last 3 Encounters:  07/27/13 120/70  04/28/13 132/76  03/29/13 116/74      Review of Systems  Constitutional: Negative for appetite change, fatigue and unexpected weight change.  HENT: Positive for hearing loss. Negative for congestion, nosebleeds, sneezing, sore throat and trouble swallowing.   Eyes: Negative for itching and visual disturbance.  Respiratory: Negative for cough and stridor.   Cardiovascular: Negative for chest pain, palpitations and leg swelling.  Gastrointestinal: Negative for nausea, diarrhea, blood in stool and abdominal distention.  Genitourinary: Negative for frequency, hematuria and pelvic pain.  Musculoskeletal: Negative for back pain, gait problem, joint swelling, myalgias and neck pain.  Skin: Negative for rash.  Neurological: Negative for dizziness, tremors, speech difficulty and weakness.  Psychiatric/Behavioral: Negative for suicidal ideas, sleep disturbance, dysphoric mood and agitation. The patient is not nervous/anxious.        Objective:   Physical Exam  Constitutional: He is oriented to  person, place, and time. He appears well-developed. No distress.  NAD Obese  HENT:  Mouth/Throat: Oropharynx is clear and moist.  Eyes: Conjunctivae are normal. Pupils are equal, round, and reactive to light.  Neck: Normal range of motion. No JVD present. No thyromegaly present.  Cardiovascular: Normal rate and intact distal pulses.  Exam reveals no gallop and no friction rub.   No murmur heard. irreg irreg  Pulmonary/Chest: Effort normal and breath sounds normal. No respiratory distress. He has no wheezes. He has no rales. He exhibits no tenderness.  Abdominal: Soft. Bowel sounds are normal. He exhibits no distension and no mass. There is no tenderness. There is no rebound and no guarding.  Genitourinary: Penis normal.  1 wart on penile shaft L and 1 on scrotum L  Musculoskeletal: Normal range of motion. He exhibits edema (R ankle is a little swollen w/varicies). He exhibits no tenderness.  Lymphadenopathy:    He has no cervical adenopathy.  Neurological: He is alert and oriented to person, place, and time. He has normal reflexes. No cranial nerve deficit. He exhibits normal muscle tone. He displays a negative Romberg sign. Coordination and gait normal.  No meningeal signs  Skin: Skin is warm and dry. No rash noted.  Psychiatric: He has a normal mood and affect. His behavior is normal. Judgment and thought content normal.      Lab Results  Component Value Date   WBC 5.6 02/01/2013   HGB 14.1 02/01/2013   HCT 41.1 02/01/2013   PLT 222.0 02/01/2013   GLUCOSE 79 02/01/2013   CHOL 153 02/01/2013   TRIG 66.0  02/01/2013   HDL 46.00 02/01/2013   LDLCALC 94 02/01/2013   ALT 56* 02/01/2013   AST 33 02/01/2013   NA 138 02/01/2013   K 4.3 02/01/2013   CL 105 02/01/2013   CREATININE 1.0 02/01/2013   BUN 16 02/01/2013   CO2 29 02/01/2013   TSH 1.24 02/01/2013   PSA 0.45 02/01/2013   INR 1.46 08/22/2012       Assessment & Plan:

## 2013-07-27 NOTE — Telephone Encounter (Signed)
Pt thinks someone called to give him results from labs and x-ray.  You can leave a message on his parents phone listed.

## 2013-07-27 NOTE — Progress Notes (Signed)
Pre visit review using our clinic review tool, if applicable. No additional management support is needed unless otherwise documented below in the visit note. 

## 2013-07-27 NOTE — Assessment & Plan Note (Signed)
2/15 [facility]K vs other - ie pancreatitis X ray Labs Tramadol prn

## 2013-07-28 ENCOUNTER — Encounter: Payer: Self-pay | Admitting: Internal Medicine

## 2013-07-28 NOTE — Telephone Encounter (Signed)
Pt's mother informed labs and xray are normal.

## 2013-08-18 ENCOUNTER — Other Ambulatory Visit: Payer: Self-pay | Admitting: *Deleted

## 2013-08-18 MED ORDER — METOPROLOL TARTRATE 25 MG PO TABS: ORAL_TABLET | ORAL | Status: DC

## 2013-08-18 MED ORDER — RIVAROXABAN 20 MG PO TABS: 20.0000 mg | ORAL_TABLET | Freq: Every day | ORAL | Status: DC

## 2013-10-21 ENCOUNTER — Other Ambulatory Visit: Payer: Self-pay | Admitting: Internal Medicine

## 2013-11-15 ENCOUNTER — Telehealth: Payer: Self-pay | Admitting: Internal Medicine

## 2013-11-15 NOTE — Telephone Encounter (Signed)
Will need to schedule appointment to come in to schedule

## 2013-11-15 NOTE — Telephone Encounter (Signed)
New message     Patient calling to set up ablation procedure . Can leave a message

## 2013-11-16 NOTE — Telephone Encounter (Signed)
lft message for patient to call and schedule

## 2013-11-21 ENCOUNTER — Other Ambulatory Visit: Payer: Self-pay

## 2013-11-21 MED ORDER — AMIODARONE HCL 200 MG PO TABS: 200.0000 mg | ORAL_TABLET | Freq: Two times a day (BID) | ORAL | Status: DC

## 2013-11-26 ENCOUNTER — Other Ambulatory Visit: Payer: Self-pay | Admitting: Internal Medicine

## 2013-12-18 ENCOUNTER — Encounter: Payer: Self-pay | Admitting: Internal Medicine

## 2013-12-18 ENCOUNTER — Encounter: Payer: Self-pay | Admitting: *Deleted

## 2013-12-18 ENCOUNTER — Ambulatory Visit (INDEPENDENT_AMBULATORY_CARE_PROVIDER_SITE_OTHER): Payer: BC Managed Care – PPO | Admitting: Internal Medicine

## 2013-12-18 VITALS — BP 118/70 | HR 75 | Ht 74.0 in | Wt 298.0 lb

## 2013-12-18 DIAGNOSIS — I4891 Unspecified atrial fibrillation: Secondary | ICD-10-CM

## 2013-12-18 DIAGNOSIS — I4819 Other persistent atrial fibrillation: Secondary | ICD-10-CM

## 2013-12-18 DIAGNOSIS — I5022 Chronic systolic (congestive) heart failure: Secondary | ICD-10-CM

## 2013-12-18 DIAGNOSIS — I483 Typical atrial flutter: Secondary | ICD-10-CM

## 2013-12-18 DIAGNOSIS — G4733 Obstructive sleep apnea (adult) (pediatric): Secondary | ICD-10-CM

## 2013-12-18 DIAGNOSIS — I4892 Unspecified atrial flutter: Secondary | ICD-10-CM

## 2013-12-18 NOTE — Progress Notes (Signed)
Primary Care Physician: Lance PrimesAlex Plotnikov, MD Primary EP:  Dr Lance [name]   [place] Lance BlanksM Lance [name] is a 33 y.o. male with a h/o persistent afib and tachy mediated cardiomyopathy who presents today for further afib management. Marland Kitchen.  He is s/p prior aflutter ablation by Dr Lance [name].  Last year, his ejection fraction was 10% and he had significant CHF in the setting of afib with RVR.  He has been placed on amiodarone for atrial fibrillation which has improved his symptoms markedly, as well as latest EF documented at 55%, (02/2013). He was scheduled for a radiofrequency ablation for his A. Fib, last fall, and then changed his mind. Today he is here asking to be rescheduled for A. fib ablation. His ultimate goal is to get off his medications. He wants to be very physically active without fear of falling and possible brain bleed. For the most part he is maintaining sinus rhythm with very brief breakthrough A. fib for  less than 5 minutes twice a week. Unfortunately he has gained almost 20 pounds in the last year. Very serious discussion regarding  catheter ablation be more effective to maintain sinus rhythm with weight loss. Patient will try to lose at least 10 pounds prior to September when the procedure is scheduled. He does have sleep apnea and is wearing CPAP a regular basis.   Past Medical History  Diagnosis Date  . Persistent atrial fibrillation 2003  . OSA on CPAP   . Onychomycosis   . Acute systolic heart failure 09/14/2012  . Atrial flutter     s/p ablation by Dr Lance [name]   Past Surgical History  Procedure Laterality Date  . Mandible surgery      underbite correction  . Cardioversion N/A 08/14/2012    Procedure: CARDIOVERSION;  Surgeon: Lance Pattyaniel R Bensimhon, MD;  Location: [facility] OR;  Service: Cardiovascular;  Laterality: N/A;  . Atrial flutter ablation  09/14/2012    by Dr Lance [name]    Current Outpatient Prescriptions  Medication Sig Dispense Refill  . acetaminophen (TYLENOL) 500 MG tablet Take 1,000 mg by mouth  daily as needed (for Headaches).      Marland Kitchen. amiodarone (PACERONE) 200 MG tablet Take 200 mg by mouth daily.      . furosemide (LASIX) 40 MG tablet Take 1 tablet (40 mg total) by mouth as needed.  30 tablet  3  . metoprolol tartrate (LOPRESSOR) 25 MG tablet Take 1 tab in am      . podofilox (CONDYLOX) 0.5 % gel Apply topically 2 (two) times daily. As directed  3.5 g  1  . ramipril (ALTACE) 5 MG capsule TAKE ONE CAPSULE BY MOUTH ONCE DAILY  30 capsule  0  . tetrahydrozoline-zinc (VISINE-AC) 0.05-0.25 % ophthalmic solution Place 1 drop into both eyes 3 (three) times daily as needed (dry eyes).      Lance [name]. XARELTO 20 MG TABS tablet TAKE ONE TABLET BY MOUTH ONCE DAILY  30 tablet  0   No current facility-administered medications for this visit.    Allergies  Allergen Reactions  . Amiodarone Other (See Comments)    IV intolerance with Severe hypotension, shock after amiodarone bolus.  Taking po Amiodarone without problems.  . Fish Allergy     Not shell fish - regular fish causes tingling  . Metoprolol Succinate Other (See Comments)    Body aches at high doses.  Currently takes two tablets of lopressor 25mg  twice daily; three tablets twice daily caused him to ache.    History  Social History  . Marital Status: Single    Spouse Name: N/A    Number of Children: N/A  . Years of Education: N/A   Occupational History  . [employer]    Social History Main Topics  . Smoking status: Former Smoker -- 3 years    Types: Cigarettes, Cigars    Quit date: 05/12/2009  . Smokeless tobacco: Never Used     Comment: 4 cigars daily  . Alcohol Use: Yes     Comment: Glass of wine on weekends  . Drug Use: Yes    Special: Marijuana     Comment: occassional use   . Sexual Activity: Yes   Other Topics Concern  . Not on file   Social History Narrative  . No narrative on file    Family History  Problem Relation Age of Onset  . Diabetes Mother   . Hypertension Mother   . Depression Father   . Osteoarthritis  Father   . Alcohol abuse Father     ROS- All systems are reviewed and negative except as per the HPI above  Physical Exam: Filed Vitals:   12/18/13 1607  BP: 118/70  Pulse: 75  Height: 6\' 2"  (1.88 m)  Weight: 298 lb (135.172 kg)    GEN- The patient is overweight appearing, alert and oriented x 3 today.   Head- normocephalic, atraumatic Eyes-  Sclera clear, conjunctiva pink Ears- hearing intact Oropharynx- clear Neck- supple, no JVP Lymph- no cervical lymphadenopathy Lungs- Clear to ausculation bilaterally, normal work of breathing Heart- Regular rate and rhythm, no murmurs, rubs or gallops, PMI not laterally displaced GI- soft, NT, ND, + BS Extremities- no clubbing, cyanosis, or edema MS- no significant deformity or atrophy Skin- no rash or lesion Psych- euthymic mood, full affect Neuro- strength and sensation are intact  EKG today reveals sinus rhythm 75 bpm, PR 160, QT corrected 469 ms, left axis deviation, incomplete right bundle branch block And a week I M.D.  Assessment and Plan:  1. Persistent afib The patient has symptomatic persistent afib though he is now more paroxysmal on amiodarone.  He is s/p prior atrial flutter ablation by Dr Lance Ridgel.  He did not have evidence of accessory pathways at that time.   Therapeutic strategies for afib including medicine and ablation were discussed in detail with the patient today. Risk, benefits, and alternatives to EP study and radiofrequency ablation for afib were also discussed in detail today. These risks include but are not limited to stroke, bleeding, vascular damage, tamponade, perforation, damage to the esophagus, lungs, and other structures, pulmonary vein stenosis, worsening renal function, and death. The patient understands these risk and wishes to proceed.  We will schedule ablation at the next available time.  2. OSA Compliance with CPAP is advised.  3. Nonischemic CM Rate control/ rhythm control is important long  term.  His EF has improved.  4. Continue Xarelto Understands the importance of not missing any doses prior to ablation.  5. Morbid obesity patient was made aware that this is a risk factor for recurrent atrial fibrillation and will undermine the success of radiofrequency ablation. He has agreed to obtain at least 10 pounds weight loss prior to the procedure.Diet modification and exercise was discussed with the patient.  6. patient will need liver panel, TSH and T4 drawn due to long-term amiodarone use.. This has been scheduled prior to his physical exam with an his PCP which should be within the next month.

## 2013-12-18 NOTE — Patient Instructions (Signed)
Your physician has recommended that you have an ablation. Catheter ablation is a medical procedure used to treat some cardiac arrhythmias (irregular heartbeats). During catheter ablation, a long, thin, flexible tube is put into a blood vessel in your groin (upper thigh), or neck. This tube is called an ablation catheter. It is then guided to your heart through the blood vessel. Radio frequency waves destroy small areas of heart tissue where abnormal heartbeats may cause an arrhythmia to start. Please see the instruction sheet given to you today.  Will schedule for 02/15/14---will need to be at the hospital at 5:30am TEE on 02/14/14---will schedule and call

## 2013-12-21 ENCOUNTER — Encounter: Payer: Self-pay | Admitting: Internal Medicine

## 2013-12-23 ENCOUNTER — Other Ambulatory Visit: Payer: Self-pay | Admitting: Internal Medicine

## 2014-01-19 ENCOUNTER — Telehealth: Payer: Self-pay | Admitting: Internal Medicine

## 2014-01-19 NOTE — Telephone Encounter (Signed)
Walk In pt Form " FMLA" Dropped off Sent to [facility] 8.14.15/km

## 2014-01-25 ENCOUNTER — Telehealth: Payer: Self-pay | Admitting: Internal Medicine

## 2014-01-26 NOTE — Telephone Encounter (Signed)
FMLA & return to work certification dropped off w/o payment or signed [id number] as walk in patient form on 8.20.15 at 4:35: forwarded to [employer] on 8.21.15:djc

## 2014-02-05 ENCOUNTER — Encounter: Payer: Self-pay | Admitting: Internal Medicine

## 2014-02-05 ENCOUNTER — Ambulatory Visit (INDEPENDENT_AMBULATORY_CARE_PROVIDER_SITE_OTHER): Payer: BC Managed Care – PPO | Admitting: Internal Medicine

## 2014-02-05 ENCOUNTER — Other Ambulatory Visit (INDEPENDENT_AMBULATORY_CARE_PROVIDER_SITE_OTHER): Payer: BC Managed Care – PPO

## 2014-02-05 VITALS — BP 120/60 | Temp 98.2°F | Ht 74.0 in | Wt 293.1 lb

## 2014-02-05 DIAGNOSIS — H0019 Chalazion unspecified eye, unspecified eyelid: Secondary | ICD-10-CM

## 2014-02-05 DIAGNOSIS — I872 Venous insufficiency (chronic) (peripheral): Secondary | ICD-10-CM | POA: Insufficient documentation

## 2014-02-05 DIAGNOSIS — H0011 Chalazion right upper eyelid: Secondary | ICD-10-CM

## 2014-02-05 DIAGNOSIS — I482 Chronic atrial fibrillation, unspecified: Secondary | ICD-10-CM

## 2014-02-05 DIAGNOSIS — I4891 Unspecified atrial fibrillation: Secondary | ICD-10-CM

## 2014-02-05 DIAGNOSIS — Z0000 Encounter for general adult medical examination without abnormal findings: Secondary | ICD-10-CM

## 2014-02-05 DIAGNOSIS — Z23 Encounter for immunization: Secondary | ICD-10-CM

## 2014-02-05 DIAGNOSIS — G4730 Sleep apnea, unspecified: Secondary | ICD-10-CM

## 2014-02-05 LAB — URINALYSIS
Bilirubin Urine: NEGATIVE
HGB URINE DIPSTICK: NEGATIVE
Ketones, ur: NEGATIVE
Leukocytes, UA: NEGATIVE
NITRITE: NEGATIVE
Specific Gravity, Urine: 1.025 (ref 1.000–1.030)
Total Protein, Urine: NEGATIVE
UROBILINOGEN UA: 0.2 (ref 0.0–1.0)
Urine Glucose: NEGATIVE
pH: 5.5 (ref 5.0–8.0)

## 2014-02-05 LAB — LIPID PANEL
Cholesterol: 138 mg/dL (ref 0–200)
HDL: 41.7 mg/dL (ref >39.00)
LDL CALC: 84 mg/dL (ref 0–99)
NONHDL: 96.3
Total CHOL/HDL Ratio: 3
Triglycerides: 61 mg/dL (ref 0.0–149.0)
VLDL: 12.2 mg/dL (ref 0.0–40.0)

## 2014-02-05 LAB — BASIC METABOLIC PANEL
BUN: 13 mg/dL (ref 6–23)
CO2: 25 mEq/L (ref 19–32)
Calcium: 9.2 mg/dL (ref 8.4–10.5)
Chloride: 106 mEq/L (ref 96–112)
Creatinine, Ser: 1 mg/dL (ref 0.4–1.5)
GFR: 113.44 mL/min (ref >60.00)
Glucose, Bld: 82 mg/dL (ref 70–99)
POTASSIUM: 4.1 meq/L (ref 3.5–5.1)
Sodium: 139 mEq/L (ref 135–145)

## 2014-02-05 LAB — CBC WITH DIFFERENTIAL/PLATELET
BASOS ABS: 0 10*3/uL (ref 0.0–0.1)
Basophils Relative: 0.3 % (ref 0.0–3.0)
EOS PCT: 1.6 % (ref 0.0–5.0)
Eosinophils Absolute: 0.1 10*3/uL (ref 0.0–0.7)
HEMATOCRIT: 42.7 % (ref 39.0–52.0)
Hemoglobin: 14.4 g/dL (ref 13.0–17.0)
LYMPHS ABS: 1.3 10*3/uL (ref 0.7–4.0)
Lymphocytes Relative: 21 % (ref 12.0–46.0)
MCHC: 33.8 g/dL (ref 30.0–36.0)
MCV: 90.8 fl (ref 78.0–100.0)
MONOS PCT: 9.6 % (ref 3.0–12.0)
Monocytes Absolute: 0.6 10*3/uL (ref 0.1–1.0)
NEUTROS PCT: 67.5 % (ref 43.0–77.0)
Neutro Abs: 4.1 10*3/uL (ref 1.4–7.7)
Platelets: 217 10*3/uL (ref 150.0–400.0)
RBC: 4.7 Mil/uL (ref 4.22–5.81)
RDW: 13.8 % (ref 11.5–15.5)
WBC: 6 10*3/uL (ref 4.0–10.5)

## 2014-02-05 LAB — HEPATIC FUNCTION PANEL
ALK PHOS: 45 U/L (ref 39–117)
ALT: 31 U/L (ref 0–53)
AST: 24 U/L (ref 0–37)
Albumin: 4.2 g/dL (ref 3.5–5.2)
BILIRUBIN DIRECT: 0.1 mg/dL (ref 0.0–0.3)
Total Bilirubin: 0.7 mg/dL (ref 0.2–1.2)
Total Protein: 7.6 g/dL (ref 6.0–8.3)

## 2014-02-05 LAB — TSH: TSH: 1.14 u[IU]/mL (ref 0.35–4.50)

## 2014-02-05 NOTE — Assessment & Plan Note (Signed)
Ablationis pending 

## 2014-02-05 NOTE — Assessment & Plan Note (Signed)
Not using  CPAP  Wt Readings from Last 3 Encounters:  02/05/14 243 lb (110.224 kg)  12/18/13 298 lb (135.172 kg)  07/27/13 299 lb (135.626 kg)

## 2014-02-05 NOTE — Progress Notes (Signed)
Pre visit review using our clinic review tool, if applicable. No additional management support is needed unless otherwise documented below in the visit note. 

## 2014-02-05 NOTE — Assessment & Plan Note (Signed)
We discussed age appropriate health related issues, including available/recomended screening tests and vaccinations. We discussed a need for adhering to healthy diet and exercise. Labs/EKG were reviewed/ordered. All questions were answered.   

## 2014-02-05 NOTE — Assessment & Plan Note (Signed)
Ophth ref is pending On Keflex

## 2014-02-05 NOTE — Progress Notes (Signed)
Subjective:    HPI  The patient is here for a wellness exam.   The patient needs to address  chronic hypertension that has been well controlled with medicines; to address chronic  A fib controlled with medicines as well; and to address h/o CHF, controlled with medical treatment.   F/u OSA  Not using CPAP  Wt Readings from Last 3 Encounters:  02/05/14 243 lb (110.224 kg)  12/18/13 298 lb (135.172 kg)  07/27/13 299 lb (135.626 kg)   BP Readings from Last 3 Encounters:  02/05/14 120/60  12/18/13 118/70  07/27/13 120/70      Review of Systems  Constitutional: Negative for appetite change, fatigue and unexpected weight change.  HENT: Positive for hearing loss. Negative for congestion, nosebleeds, sneezing, sore throat and trouble swallowing.   Eyes: Negative for itching and visual disturbance.  Respiratory: Negative for cough and stridor.   Cardiovascular: Negative for chest pain, palpitations and leg swelling.  Gastrointestinal: Negative for nausea, diarrhea, blood in stool and abdominal distention.  Genitourinary: Negative for frequency and hematuria.  Musculoskeletal: Negative for back pain, gait problem, joint swelling, myalgias and neck pain.  Skin: Negative for rash.  Neurological: Negative for dizziness, tremors, speech difficulty and weakness.  Psychiatric/Behavioral: Negative for suicidal ideas, sleep disturbance, dysphoric mood and agitation. The patient is not nervous/anxious.        Objective:   Physical Exam  Constitutional: He is oriented to person, place, and time. He appears well-developed. No distress.  NAD Obese  HENT:  Mouth/Throat: Oropharynx is clear and moist.  Eyes: Conjunctivae are normal. Pupils are equal, round, and reactive to light.  Neck: Normal range of motion. No JVD present. No thyromegaly present.  Cardiovascular: Normal rate and intact distal pulses.  Exam reveals no gallop and no friction rub.   No murmur heard. irreg irreg   Pulmonary/Chest: Effort normal and breath sounds normal. No respiratory distress. He has no wheezes. He has no rales. He exhibits no tenderness.  Abdominal: Soft. Bowel sounds are normal. He exhibits no distension and no mass. There is no tenderness. There is no rebound and no guarding.  Genitourinary: Penis normal.  1 wart on penile shaft L and 1 on scrotum L  Musculoskeletal: Normal range of motion. He exhibits edema (R ankle is a little swollen w/varicies). He exhibits no tenderness.  Lymphadenopathy:    He has no cervical adenopathy.  Neurological: He is alert and oriented to person, place, and time. He has normal reflexes. No cranial nerve deficit. He exhibits normal muscle tone. He displays a negative Romberg sign. Coordination and gait normal.  No meningeal signs  Skin: Skin is warm and dry. No rash noted.  Psychiatric: He has a normal mood and affect. His behavior is normal. Judgment and thought content normal.  cyst on R upper eyelid    Lab Results  Component Value Date   WBC 6.3 07/27/2013   HGB 14.7 07/27/2013   HCT 44.3 07/27/2013   PLT 237.0 07/27/2013   GLUCOSE 101* 07/27/2013   CHOL 153 02/01/2013   TRIG 66.0 02/01/2013   HDL 46.00 02/01/2013   LDLCALC 94 02/01/2013   ALT 36 07/27/2013   AST 26 07/27/2013   NA 139 07/27/2013   K 4.7 07/27/2013   CL 105 07/27/2013   CREATININE 1.0 07/27/2013   BUN 12 07/27/2013   CO2 27 07/27/2013   TSH 1.24 02/01/2013   PSA 0.45 02/01/2013   INR 1.46 08/22/2012  Assessment & Plan:

## 2014-02-05 NOTE — Assessment & Plan Note (Signed)
Consult Dr Donia Ast

## 2014-02-09 ENCOUNTER — Other Ambulatory Visit: Payer: Self-pay | Admitting: *Deleted

## 2014-02-09 ENCOUNTER — Encounter: Payer: Self-pay | Admitting: *Deleted

## 2014-02-09 DIAGNOSIS — I4819 Other persistent atrial fibrillation: Secondary | ICD-10-CM

## 2014-02-14 ENCOUNTER — Encounter (HOSPITAL_COMMUNITY): Payer: Self-pay | Admitting: *Deleted

## 2014-02-14 ENCOUNTER — Encounter (HOSPITAL_COMMUNITY)
Admission: RE | Disposition: A | Discharge status: Home / Self Care | Payer: Self-pay | Source: Ambulatory Visit | Attending: Cardiology

## 2014-02-14 ENCOUNTER — Ambulatory Visit (HOSPITAL_COMMUNITY)
Admission: RE | Admit: 2014-02-14 | Discharge: 2014-02-14 | Disposition: A | Discharge status: Home / Self Care | Payer: BC Managed Care – PPO | Source: Ambulatory Visit | Attending: Cardiology | Admitting: Cardiology

## 2014-02-14 DIAGNOSIS — I4891 Unspecified atrial fibrillation: Secondary | ICD-10-CM | POA: Diagnosis present

## 2014-02-14 DIAGNOSIS — Z888 Allergy status to other drugs, medicaments and biological substances status: Secondary | ICD-10-CM | POA: Insufficient documentation

## 2014-02-14 DIAGNOSIS — I4819 Other persistent atrial fibrillation: Secondary | ICD-10-CM

## 2014-02-14 DIAGNOSIS — Z91013 Allergy to seafood: Secondary | ICD-10-CM | POA: Diagnosis not present

## 2014-02-14 DIAGNOSIS — I428 Other cardiomyopathies: Secondary | ICD-10-CM | POA: Diagnosis not present

## 2014-02-14 DIAGNOSIS — G4733 Obstructive sleep apnea (adult) (pediatric): Secondary | ICD-10-CM | POA: Insufficient documentation

## 2014-02-14 DIAGNOSIS — Z87891 Personal history of nicotine dependence: Secondary | ICD-10-CM | POA: Diagnosis not present

## 2014-02-14 DIAGNOSIS — Z79899 Other long term (current) drug therapy: Secondary | ICD-10-CM | POA: Insufficient documentation

## 2014-02-14 HISTORY — PX: TEE WITHOUT CARDIOVERSION: SHX5443

## 2014-02-14 SURGERY — ECHOCARDIOGRAM, TRANSESOPHAGEAL
Anesthesia: Moderate Sedation

## 2014-02-14 MED ORDER — FENTANYL CITRATE 0.05 MG/ML IJ SOLN
INTRAMUSCULAR | Status: DC | PRN
  Administered 2014-02-14 (×3): 25 ug via INTRAVENOUS

## 2014-02-14 MED ORDER — LIDOCAINE VISCOUS 2 % MT SOLN
OROMUCOSAL | Status: DC | PRN
  Administered 2014-02-14: 23 mL via OROMUCOSAL

## 2014-02-14 MED ORDER — SODIUM CHLORIDE 0.9 % IV SOLN
INTRAVENOUS | Status: DC
  Administered 2014-02-14: 500 mL via INTRAVENOUS

## 2014-02-14 MED ORDER — FENTANYL CITRATE 0.05 MG/ML IJ SOLN
INTRAMUSCULAR | Status: AC
  Filled 2014-02-14: qty 2

## 2014-02-14 MED ORDER — LIDOCAINE VISCOUS 2 % MT SOLN
OROMUCOSAL | Status: AC
  Filled 2014-02-14: qty 15

## 2014-02-14 MED ORDER — MIDAZOLAM HCL 10 MG/2ML IJ SOLN
INTRAMUSCULAR | Status: DC | PRN
Start: 2014-02-14 — End: 2014-02-14
  Administered 2014-02-14: 1 mg via INTRAVENOUS
  Administered 2014-02-14: 3 mg via INTRAVENOUS

## 2014-02-14 MED ORDER — MIDAZOLAM HCL 5 MG/ML IJ SOLN
INTRAMUSCULAR | Status: AC
  Filled 2014-02-14: qty 2

## 2014-02-14 MED ORDER — BUTAMBEN-TETRACAINE-BENZOCAINE 2-2-14 % EX AERO
INHALATION_SPRAY | CUTANEOUS | Status: DC | PRN
  Administered 2014-02-14: 3 via TOPICAL

## 2014-02-14 NOTE — Interval H&P Note (Signed)
History and Physical Interval Note:  02/14/2014 8:42 AM  [name]  has presented today for surgery, with the diagnosis of AFIB  The various methods of treatment have been discussed with the patient and family. After consideration of risks, benefits and other options for treatment, the patient has consented to  Procedure(s): TRANSESOPHAGEAL ECHOCARDIOGRAM (TEE) (N/A) as a surgical intervention .  The patient's history has been reviewed, patient examined, no change in status, stable for surgery.  I have reviewed the patient's chart and labs.  Questions were answered to the patient's satisfaction.     TURNER,TRACI R

## 2014-02-14 NOTE — CV Procedure (Addendum)
    PROCEDURE NOTE  Procedure: Transesophageal echocardiogram Operator:  Armanda Magic, MD Indications: atrial fibrillation pre ablation IV Meds: Fentanyl , Versed 4mg  IV Complications:None  Results: Normal LV size with low normal LVF EF 50% Mildly dilated LA with no evidence of thrombus in the LA or LA appendage.  Normal emptying velocity. Mildly dilated RA Normal RV Normal trileaflet AV Normal TV with mild TR Normal MV with trivial MR Normal PV with trivial PR Normal interatrial septum with no evidence of shunt by colorflow doppler or agitated saline contrast injection Normal thoracic and ascending aorta  The patient tolerated the procedure well with no complications and was transferred back to his room in stable condition.    Signed: Armanda Magic, MD [facility]

## 2014-02-14 NOTE — H&P (Signed)
Primary Care Physician: Sonda Primes, MD  Primary EP: Dr Andrena Mews Lance [name] is a 33 y.o. male with a h/o persistent afib and tachy mediated cardiomyopathy who presents today for TEE prior to afib ablation. Marland Kitchen He is s/p prior aflutter ablation by Dr [name]. Last year, his ejection fraction was 10% and he had significant CHF in the setting of afib with RVR. He has been placed on amiodarone for atrial fibrillation which has improved his symptoms markedly, as well as latest EF documented at 55%, (02/2013). He was scheduled for a radiofrequency ablation for his A. Fib, last fall, and then changed his mind.  For the most part he is maintaining sinus rhythm with very brief breakthrough A. fib for less than 5 minutes twice a week. Unfortunately he has gained almost 20 pounds in the last year.  He does have sleep apnea and is wearing CPAP a regular basis.   Past Medical History   Diagnosis  Date   .  Persistent atrial fibrillation  2003   .  OSA on CPAP    .  Onychomycosis    .  Acute systolic heart failure  09/14/2012   .  Atrial flutter      s/p ablation by Dr [name]    Past Surgical History   Procedure  Laterality  Date   .  Mandible surgery       underbite correction   .  Cardioversion  N/A  08/14/2012     Procedure: CARDIOVERSION; Surgeon: Dolores Patty, MD; Location: [facility] OR; Service: Cardiovascular; Laterality: N/A;   .  Atrial flutter ablation   09/14/2012     by Dr [name]    Current Outpatient Prescriptions   Medication  Sig  Dispense  Refill   .  acetaminophen (TYLENOL) 500 MG tablet  Take 1,000 mg by mouth daily as needed (for Headaches).     Marland Kitchen  amiodarone (PACERONE) 200 MG tablet  Take 200 mg by mouth daily.     .  furosemide (LASIX) 40 MG tablet  Take 1 tablet (40 mg total) by mouth as needed.  30 tablet  3   .  metoprolol tartrate (LOPRESSOR) 25 MG tablet  Take 1 tab in am     .  podofilox (CONDYLOX) 0.5 % gel  Apply topically 2 (two) times daily. As directed  3.5 g  1     .  ramipril (ALTACE) 5 MG capsule  TAKE ONE CAPSULE BY MOUTH ONCE DAILY  30 capsule  0   .  tetrahydrozoline-zinc (VISINE-AC) 0.05-0.25 % ophthalmic solution  Place 1 drop into both eyes 3 (three) times daily as needed (dry eyes).     Carlena Hurl 20 MG TABS tablet  TAKE ONE TABLET BY MOUTH ONCE DAILY  30 tablet  0    No current facility-administered medications for this visit.    Allergies   Allergen  Reactions   .  Amiodarone  Other (See Comments)     IV intolerance with Severe hypotension, shock after amiodarone bolus. Taking po Amiodarone without problems.   .  Fish Allergy      Not shell fish - regular fish causes tingling   .  Metoprolol Succinate  Other (See Comments)     Body aches at high doses. Currently takes two tablets of lopressor  twice daily; three tablets twice daily caused him to ache.    History    Social History   .  Marital  Status:  Single     Spouse Name:  N/A     Number of Children:  N/A   .  Years of Education:  N/A    Occupational History   .  [employer]     Social History Main Topics   .  Smoking status:  Former Smoker -- 3 years     Types:  Cigarettes, Cigars     Quit date:  05/12/2009   .  Smokeless tobacco:  Never Used      Comment: 4 cigars daily   .  Alcohol Use:  Yes      Comment: Glass of wine on weekends   .  Drug Use:  Yes     Special:  Marijuana      Comment: occassional use   .  Sexual Activity:  Yes    Other Topics  Concern   .  Not on file    Social History Narrative   .  No narrative on file    Family History   Problem  Relation  Age of Onset   .  Diabetes  Mother    .  Hypertension  Mother    .  Depression  Father    .  Osteoarthritis  Father    .  Alcohol abuse  Father    ROS- All systems are reviewed and negative except as per the HPI above  Physical Exam:  Filed Vitals:    12/18/13 1607   BP:  118/70   Pulse:  75   Height:   (1.88 m)   Weight:  298 lb (135.172 kg)   GEN- The patient is overweight appearing,  alert and oriented x 3 today.  Head- normocephalic, atraumatic  Eyes- Sclera clear, conjunctiva pink  Ears- hearing intact  Oropharynx- clear  Neck- supple, no JVP  Lymph- no cervical lymphadenopathy  Lungs- Clear to ausculation bilaterally, normal work of breathing  Heart- Regular rate and rhythm, no murmurs, rubs or gallops, PMI not laterally displaced  GI- soft, NT, ND, + BS  Extremities- no clubbing, cyanosis, or edema  MS- no significant deformity or atrophy  Skin- no rash or lesion  Psych- euthymic mood, full affect  Neuro- strength and sensation are intact  EKG today reveals sinus rhythm 75 bpm, PR 160, QT corrected 469 ms, left axis deviation, incomplete right bundle branch block    Assessment and Plan:  1. Persistent afib  The patient has symptomatic persistent afib though he is now more paroxysmal on amiodarone. He is s/p prior atrial flutter ablation by Dr [name]. He did not have evidence of accessory pathways at that time.  Therapeutic strategies for afib including medicine and ablation were discussed in detail with the patient today. Risk, benefits, and alternatives to EP study and radiofrequency ablation for afib were also discussed in detail by Dr. Johney Frame. These risks include but are not limited to stroke, bleeding, vascular damage, tamponade, perforation, damage to the esophagus, lungs, and other structures, pulmonary vein stenosis, worsening renal function, and death. The patient understands these risk and wishes to proceed.  2. OSA Compliance with CPAP is advised.  3. Nonischemic CM  Rate control/ rhythm control is important long term. His EF has improved.  4. Continue Xarelto  Understands the importance of not missing any doses prior to ablation.  5. Morbid obesity  patient was made aware that this is a risk factor for recurrent atrial fibrillation and will undermine the success of radiofrequency ablation.  Signed: Gloris Manchester  Mayford Knife, MD [facility] 02/14/2014

## 2014-02-14 NOTE — Anesthesia Preprocedure Evaluation (Addendum)
Anesthesia Evaluation  Patient identified by MRN, date of birth, ID band Patient awake    Reviewed: Allergy & Precautions, H&P , NPO status   Airway Mallampati: I TM Distance: >3 FB Neck ROM: Full    Dental  (+) Dental Advisory Given   Pulmonary sleep apnea and Continuous Positive Airway Pressure Ventilation , former smoker,  Sever OSA.  Better with CPAP 14cm pressure breath sounds clear to auscultation        Cardiovascular + dysrhythmias Atrial Fibrillation Rhythm:Irregular  Long hx of AF.  EF has been as low as 10% when fast vent rate. Now about 50-55%, on Amiododeone   Neuro/Psych    GI/Hepatic   Endo/Other    Renal/GU      Musculoskeletal   Abdominal (+) + obese,   Peds  Hematology   Anesthesia Other Findings   Reproductive/Obstetrics                         Anesthesia Physical Anesthesia Plan  ASA: III  Anesthesia Plan: MAC and General   Post-op Pain Management:    Induction: Intravenous  Airway Management Planned: Mask, LMA and Oral ETT  Additional Equipment:   Intra-op Plan:   Post-operative Plan:   Informed Consent: I have reviewed the patients History and Physical, chart, labs and discussed the procedure including the risks, benefits and alternatives for the proposed anesthesia with the patient or authorized representative who has indicated his/her understanding and acceptance.     Plan Discussed with:   Anesthesia Plan Comments: (Concerned he may not be able to maintain airway and adequate ventilation with sedation in view of his significant documented OSA. Will  REVIEW ALL OPTIONS WITH PATIENT  )        Anesthesia Quick Evaluation

## 2014-02-14 NOTE — Progress Notes (Signed)
  Echocardiogram Echocardiogram Transesophageal has been performed.  Arvil Chaco 02/14/2014, 12:18 PM

## 2014-02-14 NOTE — Progress Notes (Signed)
Pt missed his dose of Xarelto on 02/13/14. Per pt he has not missed a dose of Xarelto except for the one yesterday. Spoke with Dr. Johney Frame, who is ok with proceeding with the ablation on 02/15/14 if the pt promises not to miss his dose of Xarelto today. Pt aware he must take his Xarelto today in order to proceed with the ablation tomorrow. Brs, rn

## 2014-02-15 ENCOUNTER — Ambulatory Visit (HOSPITAL_COMMUNITY)
Admission: RE | Admit: 2014-02-15 | Discharge: 2014-02-16 | Disposition: A | Discharge status: Home / Self Care | Payer: BC Managed Care – PPO | Source: Ambulatory Visit | Attending: Internal Medicine | Admitting: Internal Medicine

## 2014-02-15 ENCOUNTER — Ambulatory Visit (HOSPITAL_COMMUNITY): Payer: BC Managed Care – PPO | Admitting: Anesthesiology

## 2014-02-15 ENCOUNTER — Encounter (HOSPITAL_COMMUNITY): Payer: Self-pay | Admitting: Cardiology

## 2014-02-15 ENCOUNTER — Encounter (HOSPITAL_COMMUNITY)
Admission: RE | Disposition: A | Discharge status: Home / Self Care | Payer: Self-pay | Source: Ambulatory Visit | Attending: Internal Medicine

## 2014-02-15 ENCOUNTER — Encounter (HOSPITAL_COMMUNITY): Payer: Self-pay | Admitting: Pharmacy Technician

## 2014-02-15 ENCOUNTER — Encounter (HOSPITAL_COMMUNITY): Payer: BC Managed Care – PPO | Admitting: Anesthesiology

## 2014-02-15 DIAGNOSIS — I509 Heart failure, unspecified: Secondary | ICD-10-CM | POA: Insufficient documentation

## 2014-02-15 DIAGNOSIS — I5022 Chronic systolic (congestive) heart failure: Secondary | ICD-10-CM | POA: Diagnosis not present

## 2014-02-15 DIAGNOSIS — R635 Abnormal weight gain: Secondary | ICD-10-CM | POA: Diagnosis not present

## 2014-02-15 DIAGNOSIS — I4819 Other persistent atrial fibrillation: Secondary | ICD-10-CM

## 2014-02-15 DIAGNOSIS — G4733 Obstructive sleep apnea (adult) (pediatric): Secondary | ICD-10-CM | POA: Diagnosis present

## 2014-02-15 DIAGNOSIS — I4891 Unspecified atrial fibrillation: Secondary | ICD-10-CM | POA: Insufficient documentation

## 2014-02-15 DIAGNOSIS — I4892 Unspecified atrial flutter: Secondary | ICD-10-CM | POA: Insufficient documentation

## 2014-02-15 DIAGNOSIS — I428 Other cardiomyopathies: Secondary | ICD-10-CM | POA: Diagnosis not present

## 2014-02-15 DIAGNOSIS — Z87891 Personal history of nicotine dependence: Secondary | ICD-10-CM | POA: Diagnosis not present

## 2014-02-15 HISTORY — DX: Other specified health status: Z78.9

## 2014-02-15 HISTORY — DX: Myoneural disorder, unspecified: G70.9

## 2014-02-15 HISTORY — PX: ATRIAL FIBRILLATION ABLATION: SHX5456

## 2014-02-15 HISTORY — DX: Peripheral vascular disease, unspecified: I73.9

## 2014-02-15 LAB — POCT ACTIVATED CLOTTING TIME: ACTIVATED CLOTTING TIME: 140 s

## 2014-02-15 LAB — MRSA PCR SCREENING: MRSA by PCR: NEGATIVE

## 2014-02-15 SURGERY — ATRIAL FIBRILLATION ABLATION
Anesthesia: Monitor Anesthesia Care

## 2014-02-15 MED ORDER — PROPOFOL 10 MG/ML IV BOLUS
INTRAVENOUS | Status: DC | PRN
  Administered 2014-02-15: 150 mg via INTRAVENOUS

## 2014-02-15 MED ORDER — RIVAROXABAN 20 MG PO TABS
20.0000 mg | ORAL_TABLET | Freq: Every day | ORAL | Status: DC
  Administered 2014-02-15: 20 mg via ORAL
  Filled 2014-02-15 (×2): qty 1

## 2014-02-15 MED ORDER — LIDOCAINE HCL (CARDIAC) 20 MG/ML IV SOLN
INTRAVENOUS | Status: DC | PRN
  Administered 2014-02-15: 40 mg via INTRAVENOUS

## 2014-02-15 MED ORDER — BUPIVACAINE HCL (PF) 0.25 % IJ SOLN
INTRAMUSCULAR | Status: AC
  Filled 2014-02-15: qty 30

## 2014-02-15 MED ORDER — ONDANSETRON HCL 4 MG/2ML IJ SOLN
INTRAMUSCULAR | Status: DC | PRN
  Administered 2014-02-15: 4 mg via INTRAVENOUS

## 2014-02-15 MED ORDER — METOPROLOL TARTRATE 25 MG PO TABS
25.0000 mg | ORAL_TABLET | Freq: Every day | ORAL | Status: DC
  Administered 2014-02-15 – 2014-02-16 (×2): 25 mg via ORAL
  Filled 2014-02-15 (×2): qty 1

## 2014-02-15 MED ORDER — DOBUTAMINE IN D5W 4-5 MG/ML-% IV SOLN
INTRAVENOUS | Status: AC
  Filled 2014-02-15: qty 250

## 2014-02-15 MED ORDER — DOBUTAMINE IN D5W 4-5 MG/ML-% IV SOLN
INTRAVENOUS | Status: DC | PRN
  Administered 2014-02-15: 20 ug/kg/min via INTRAVENOUS

## 2014-02-15 MED ORDER — ACETAMINOPHEN 325 MG PO TABS
650.0000 mg | ORAL_TABLET | ORAL | Status: DC | PRN
  Administered 2014-02-16: 650 mg via ORAL
  Filled 2014-02-15: qty 2

## 2014-02-15 MED ORDER — HEPARIN SODIUM (PORCINE) 1000 UNIT/ML IJ SOLN
INTRAMUSCULAR | Status: AC
  Filled 2014-02-15: qty 1

## 2014-02-15 MED ORDER — RAMIPRIL 5 MG PO CAPS
5.0000 mg | ORAL_CAPSULE | Freq: Every day | ORAL | Status: DC
  Administered 2014-02-15 – 2014-02-16 (×2): 5 mg via ORAL
  Filled 2014-02-15 (×2): qty 1

## 2014-02-15 MED ORDER — EPHEDRINE SULFATE 50 MG/ML IJ SOLN
INTRAMUSCULAR | Status: DC | PRN
  Administered 2014-02-15: 15 mg via INTRAVENOUS

## 2014-02-15 MED ORDER — AMIODARONE HCL 200 MG PO TABS
200.0000 mg | ORAL_TABLET | Freq: Every day | ORAL | Status: DC
  Administered 2014-02-15: 200 mg via ORAL
  Filled 2014-02-15 (×2): qty 1

## 2014-02-15 MED ORDER — MEPERIDINE HCL 25 MG/ML IJ SOLN
6.2500 mg | INTRAMUSCULAR | Status: DC | PRN
Start: 2014-02-15 — End: 2014-02-16

## 2014-02-15 MED ORDER — HYDROCODONE-ACETAMINOPHEN 5-325 MG PO TABS
1.0000 | ORAL_TABLET | ORAL | Status: DC | PRN
  Administered 2014-02-15 – 2014-02-16 (×2): 1 via ORAL
  Filled 2014-02-15: qty 2
  Filled 2014-02-15 (×2): qty 1
  Filled 2014-02-15: qty 2

## 2014-02-15 MED ORDER — SODIUM CHLORIDE 0.9 % IV SOLN
INTRAVENOUS | Status: DC | PRN
  Administered 2014-02-15: 07:00:00 via INTRAVENOUS

## 2014-02-15 MED ORDER — FENTANYL CITRATE 0.05 MG/ML IJ SOLN: 25.0000 ug | INTRAMUSCULAR | Status: DC | PRN

## 2014-02-15 MED ORDER — PROTAMINE SULFATE 10 MG/ML IV SOLN
INTRAVENOUS | Status: DC | PRN
  Administered 2014-02-15: 30 mg via INTRAVENOUS

## 2014-02-15 MED ORDER — ONDANSETRON HCL 4 MG/2ML IJ SOLN: 4.0000 mg | Freq: Four times a day (QID) | INTRAMUSCULAR | Status: DC | PRN

## 2014-02-15 MED ORDER — MIDAZOLAM HCL 5 MG/5ML IJ SOLN
INTRAMUSCULAR | Status: DC | PRN
  Administered 2014-02-15: 2 mg via INTRAVENOUS

## 2014-02-15 MED ORDER — FENTANYL CITRATE 0.05 MG/ML IJ SOLN
INTRAMUSCULAR | Status: DC | PRN
  Administered 2014-02-15: 25 ug via INTRAVENOUS
  Administered 2014-02-15: 100 ug via INTRAVENOUS
  Administered 2014-02-15 (×3): 25 ug via INTRAVENOUS
  Administered 2014-02-15: 50 ug via INTRAVENOUS

## 2014-02-15 MED ORDER — PROMETHAZINE HCL 25 MG/ML IJ SOLN: 6.2500 mg | INTRAMUSCULAR | Status: DC | PRN

## 2014-02-15 MED ORDER — SODIUM CHLORIDE 0.9 % IV SOLN: 250.0000 mL | INTRAVENOUS | Status: DC | PRN

## 2014-02-15 MED ORDER — HEPARIN SODIUM (PORCINE) 1000 UNIT/ML IJ SOLN
INTRAMUSCULAR | Status: DC | PRN
  Administered 2014-02-15: 5000 [IU] via INTRAVENOUS
  Administered 2014-02-15: 2000 [IU] via INTRAVENOUS
  Administered 2014-02-15: 12000 [IU] via INTRAVENOUS

## 2014-02-15 MED ORDER — SODIUM CHLORIDE 0.9 % IJ SOLN: 3.0000 mL | INTRAMUSCULAR | Status: DC | PRN

## 2014-02-15 MED ORDER — SODIUM CHLORIDE 0.9 % IJ SOLN
3.0000 mL | Freq: Two times a day (BID) | INTRAMUSCULAR | Status: DC
  Administered 2014-02-15 – 2014-02-16 (×2): 3 mL via INTRAVENOUS

## 2014-02-15 NOTE — Progress Notes (Signed)
Site area: right groin and a 11 FR, 9FR, and 7FR venous sheaths were removed  Site Prior to Removal:  Level 0  Pressure Applied For 20 MINUTES    Minutes Beginning at 1140am  Manual:   Yes.    Patient Status During Pull:  stable  Post Pull Groin Site:  Level 0  Post Pull Instructions Given:  Yes.    Post Pull Pulses Present:  Yes.    Dressing Applied:  Yes.    Comments:  VS remain stable thru sheath pull.  Pt denies any discomfort at the site at this time.

## 2014-02-15 NOTE — Transfer of Care (Signed)
Immediate Anesthesia Transfer of Care Note  Patient: Lance [name]  Procedure(s) Performed: Procedure(s): ATRIAL FIBRILLATION ABLATION (N/A)  Patient Location: PACU  Anesthesia Type:General  Level of Consciousness: awake and alert   Airway & Oxygen Therapy: Patient Spontanous Breathing and Patient connected to face mask oxygen  Post-op Assessment: Report given to PACU RN and Post -op Vital signs reviewed and stable  Post vital signs: Reviewed and stable  Complications: No apparent anesthesia complications

## 2014-02-15 NOTE — Progress Notes (Signed)
Pt does not want CPAP at this time 

## 2014-02-15 NOTE — Progress Notes (Signed)
Patient resting in bed comfortably at this time waiting for bed assignment. Rt groin remains soft with no active bleeding or hematoma noted. Rt groin Level 0. Family with patient at this time.

## 2014-02-15 NOTE — Discharge Summary (Signed)
ELECTROPHYSIOLOGY PROCEDURE DISCHARGE SUMMARY    Patient ID: Lance [name],  MRN: [id number], DOB/AGE: [birth year] 35 y.o.  Admit date: 02/15/2014 Discharge date: 02/16/2014  Primary Care Physician: Sonda Primes, MD Electrophysiologist: Amie Portland, MD  Primary Discharge Diagnosis:  Persistent atrial fibrillation status post ablation this admission  Secondary Discharge Diagnosis:  1.  Obstructive sleep apnea - on CPAP 2.  Systolic heart failure 3.  Previous tachycardia mediated cardiomyopathy 4.  Atrial flutter status post ablation  Procedures This Admission:  1.  Electrophysiology study and radiofrequency catheter ablation on 02-15-14 by Dr Hillis Range.  This study demonstrated sinus rhythm upon presentation; rotational Angiography reveals a moderate sized left atrium with four separate pulmonary veins without evidence of pulmonary vein stenosis; successful electrical isolation and anatomical encircling of all four pulmonary veins with radiofrequency current; no inducible arrhythmias following ablation both on and off of dobutamine; complete CTI block confirmed from a prior ablation procedure. There were no early apparent complications.   Brief HPI: Lance [name] is a 33 y.o. male  with a history of persistent atrial fibrillation.  They have failed medical therapy with amiodarone. Risks, benefits, and alternatives to catheter ablation of atrial fibrillation were reviewed with the patient who wished to proceed.  The patient underwent TEE prior to the procedure which demonstrated EF 50% and no LAA thrombus.    Hospital Course:  The patient was admitted and underwent EPS/RFCA of atrial fibrillation with details as outlined above.  They were monitored on telemetry overnight which demonstrated sinus rhythm.  Groin was without complication on the day of discharge.  The patient was examined by Dr Johney Frame and considered to be stable for discharge.  Wound care and restrictions were  reviewed with the patient.  The patient will be seen back by Dr Johney Frame in 12 weeks for post ablation follow up.   Discharge Vitals: Blood pressure 114/60, pulse 71, temperature 98.2 F (36.8 C), temperature source Oral, resp. rate 18, height  (1.88 m), weight 291 lb (131.997 kg), SpO2 96.00%.  Physical Exam: Filed Vitals:   02/16/14 0000 02/16/14 0400 02/16/14 0403 02/16/14 0729  BP:   109/55 114/60  Pulse:  83  71  Temp: 98.6 F (37 C) 98.6 F (37 C)  98.2 F (36.8 C)  TempSrc: Oral Oral  Oral  Resp:   27 18  Height:      Weight:      SpO2: 96% 94%  96%    GEN- The patient is well appearing, alert and oriented x 3 today.   Head- normocephalic, atraumatic Eyes-  Sclera clear, conjunctiva pink Ears- hearing intact Oropharynx- clear Neck- supple, Lungs- Clear to ausculation bilaterally, normal work of breathing Heart- Regular rate and rhythm, no murmurs, rubs or gallops, PMI not laterally displaced GI- soft, NT, ND, + BS Extremities- no clubbing, cyanosis, or edema, groin is without hematoma/ bruit MS- no significant deformity or atrophy Skin- no rash or lesion Psych- euthymic mood, full affect Neuro- strength and sensation are intact     Labs:   Lab Results  Component Value Date   WBC 6.0 02/05/2014   HGB 14.4 02/05/2014   HCT 42.7 02/05/2014   MCV 90.8 02/05/2014   PLT 217.0 02/05/2014     Recent Labs Lab 02/16/14 0232  NA 143  K 4.2  CL 106  CO2 25  BUN 9  CREATININE 0.99  CALCIUM 7.9*  GLUCOSE 112*      Discharge Medications:    Medication  List    STOP taking these medications       amiodarone 200 MG tablet  Commonly known as:  PACERONE      TAKE these medications       acetaminophen 500 MG tablet  Commonly known as:  TYLENOL  Take 1,000 mg by mouth daily as needed for headache.     cephALEXin 500 MG capsule  Commonly known as:  KEFLEX  Take 500 mg by mouth 2 (two) times daily.     furosemide 40 MG tablet  Commonly known as:   LASIX  Take 40 mg by mouth daily as needed for fluid.     metoprolol tartrate 25 MG tablet  Commonly known as:  LOPRESSOR  Take 25 mg by mouth daily.     pantoprazole 40 MG tablet  Commonly known as:  PROTONIX  Take 1 tablet (40 mg total) by mouth daily.     ramipril 5 MG capsule  Commonly known as:  ALTACE  Take 5 mg by mouth daily.     rivaroxaban 20 MG Tabs tablet  Commonly known as:  XARELTO  Take 20 mg by mouth daily with supper.     tetrahydrozoline-zinc 0.05-0.25 % ophthalmic solution  Commonly known as:  VISINE-AC  Place 1 drop into both eyes 3 (three) times daily as needed (dry eyes).        Disposition:   Follow-up Information   Follow up with Hillis Range, MD On 05/28/2014. (3:45 pm)    Specialty:  Cardiology   Contact information:   [address] [place] [address] [phone]       Duration of Discharge Encounter: Greater than 30 minutes including physician time.  Signed,  Hillis Range MD

## 2014-02-15 NOTE — Progress Notes (Signed)
Pt voided 400 cc of yellow urine.  Tolerated well

## 2014-02-15 NOTE — Op Note (Signed)
SURGEON:  Hillis Range, MD  PREPROCEDURE DIAGNOSES: 1. Atrial fibrillation.  POSTPROCEDURE DIAGNOSES: 1. Atrial fibrillation.  PROCEDURES: 1. Comprehensive electrophysiologic study. 2. Coronary sinus pacing and recording. 3. Three-dimensional mapping of atrial fibrillation  4. Ablation of atrial fibrillation  5. Intracardiac echocardiography. 6. Transseptal puncture of an intact septum. 7.  Rotational Angiography with processing at an independent workstation 8. Arrhythmia induction with pacing with dobutamine infusion  INTRODUCTION:  Lance [name] is a 33 y.o. male with a history of persistent atrial fibrillation and tachycardia mediated cardiomyopathy who now presents for EP study and radiofrequency ablation.  The patient reports initially being diagnosed with atrial fibrillation after presenting with CHF. The patient reports increasing frequency and duration of atrial fibrillation since that time.  He underwent atrial flutter ablation previously by Dr [name].  The patient has failed medical therapy with amiodarone.  On amiodarone, his atrial fibrillation has been more paroxysmal. The patient therefore presents today for catheter ablation of atrial fibrillation.  DESCRIPTION OF PROCEDURE:  Informed written consent was obtained, and the patient was brought to the electrophysiology lab in a fasting state.  The patient was adequately sedated with intravenous medications as outlined in the anesthesia report.  The patient's left and right groins were prepped and draped in the usual sterile fashion by the EP lab staff.  Using a percutaneous Seldinger technique, two 7-French and one 11-French hemostasis sheaths were placed into the right common femoral vein.  3 Dimensional Rotational Angiography: A 5 french pigtail catheter was introduced through the right common femoral vein and advanced into the inferior venocava.  3 demential rotational angiography was then performed by power injection of 100cc  of nonionic contrast.  Reprocessing at an independent work station was then performed.   This demonstrated a moderate sized left atrium with 4 separate pulmonary veins which were also moderate in size.  There were no anomalous veins or significant abnormalities.  A 3 dimensional rendering of the left atrium was then merged using [employer] onto the WellPoint system and registered with intracardiac echo (see below).  The pigtail catheter was then removed.  Catheter Placement:  A 7-French Biosense Webster Decapolar coronary sinus catheter was introduced through the right common femoral vein and advanced into the coronary sinus for recording and pacing from this location.  A quadripolar catheter was introduced through the right common femoral vein and advanced into the right ventricle for recording and pacing.  This catheter was then pulled back to the His bundle location.    Initial Measurements: The patient presented to the electrophysiology lab in sinus rhythm.  His PR interval measured 174 msec with a QRS duration of 109 msec and a QT interval of 455 msec.  The AH interval measured 65 msec and the HV interval measured 54 msec.     Intracardiac Echocardiography: A 10-French Biosense Webster AcuNav intracardiac echocardiography catheter was introduced through the left common femoral vein and advanced into the right atrium. Intracardiac echocardiography was performed of the left atrium, and a three-dimensional anatomical rendering of the left atrium was performed using CARTO sound technology.  The patient was noted to have a moderate sized left atrium.  The interatrial septum was prominent but not aneurysmal. All 4 pulmonary veins were visualized and noted to have separate ostia.  The pulmonary veins were moderate in size.  The right superior pulmonary vein was large in size. The left atrial appendage was visualized and did not reveal thrombus.   There was no evidence of  pulmonary vein  stenosis.   Transseptal Puncture: The middle right common femoral vein sheath was exchanged for an 8.5 [place] SL2 transseptal sheath and transseptal access was achieved in a standard fashion using a Brockenbrough needle under biplane fluoroscopy with intracardiac echocardiography confirmation of the transseptal puncture.  Once transseptal access had been achieved, heparin was administered intravenously and intra- arterially in order to maintain an ACT of greater than 300 seconds throughout the procedure.   3D Mapping and Ablation: The His bundle catheter was removed and in its place a 3.5 mm [employer] Thermocool ablation catheter was advanced into the right atrium.  The transseptal sheath was pulled back into the IVC over a guidewire.  The ablation catheter was advanced across the transseptal hole using the wire as a guide.  The transseptal sheath was then re-advanced over the guidewire into the left atrium.  A duodecapolar Biosense Webster circular mapping catheter was introduced through the transseptal sheath and positioned over the mouth of all 4 pulmonary veins.  Three-dimensional electroanatomical mapping was performed using CARTO technology.  This demonstrated electrical activity within all four pulmonary veins at baseline. The patient underwent successful sequential electrical isolation and anatomical encircling of all four pulmonary veins using radiofrequency current with a circular mapping catheter as a guide.   Measurements Following Ablation: Following ablation, dobutamine was infused up to 20 mcg/kg/min with no inducible atrial fibrillation, atrial tachycardia, atrial flutter, or sustained PACs. In sinus rhythm with RR interval was 896 msec, with PR 159 msec, QRS 122 msec, and Qtc 435 msec.  Following ablation the AH interval measured 54 msec with an HV interval of 62 msec. Ventricular pacing was performed, which revealed VA dissociation when pacing at 600 msec.  Rapid  atrial pacing was performed, which revealed an AV Wenckebach cycle length of 530 msec.  Electroisolation was then again confirmed in all four pulmonary veins.  Pacing was performed along the ablation line which confirmed entrance and exit block.  Differential pacing was then performed from the low lateral right atrium.  This confirmed complete bidirectional cavotricuspid isthmus block from a prior ablation procedure with a stimulus to earliest activation recorded bidirectionally across the isthmus measuring 200 msec.  The procedure was therefore considered completed.  All catheters were removed, and the sheaths were aspirated and flushed.  The patient was transferred to the recovery area for sheath removal per protocol. EBL<71ml.  A limited bedside transthoracic echocardiogram revealed no pericardial effusion.  There were no early apparent complications.  CONCLUSIONS: 1. Sinus rhythm upon presentation.   2. Rotational Angiography reveals a moderate sized left atrium with four separate pulmonary veins without evidence of pulmonary vein stenosis. 3. Successful electrical isolation and anatomical encircling of all four pulmonary veins with radiofrequency current. 4. No inducible arrhythmias following ablation both on and off of dobutamine 5. Complete CTI block confirmed from a prior ablation procedure 6. No early apparent complications.   Stanisha Lorenz,MD 10:16 AM 02/15/2014

## 2014-02-15 NOTE — Progress Notes (Signed)
Pt received from EP procedure alert, but drowsy with face mask at  10 liters.  Pt was changed to O2 via NL at 2 liters and able to maintain Sats at 100%.  ACT rechecked at 1130 am with 140.

## 2014-02-15 NOTE — Progress Notes (Signed)
Report called to [facility] on 2H.  Pt to floor via stretcher

## 2014-02-15 NOTE — Progress Notes (Signed)
Report given to Juliette Alcide RN

## 2014-02-15 NOTE — Anesthesia Procedure Notes (Signed)
Procedure Name: LMA Insertion Date/Time: 02/15/2014 7:37 AM Performed by: Armandina Gemma Pre-anesthesia Checklist: Patient identified, Timeout performed, Emergency Drugs available, Suction available and Patient being monitored Patient Re-evaluated:Patient Re-evaluated prior to inductionOxygen Delivery Method: Circle system utilized Preoxygenation: Pre-oxygenation with 100% oxygen Intubation Type: IV induction Ventilation: Mask ventilation without difficulty LMA: LMA inserted and LMA with gastric port inserted LMA Size: 4.0 Placement Confirmation: positive ETCO2 and breath sounds checked- equal and bilateral Tube secured with: Tape Dental Injury: Teeth and Oropharynx as per pre-operative assessment  Comments: IV induction Manney- LMA AM CRNA atraumatic- teeth and mouth as preop-

## 2014-02-15 NOTE — H&P (Signed)
Primary Care Physician: Sonda Primes, MD  Primary EP: Dr Andrena Mews Lance [name] is a 33 y.o. male with a h/o persistent afib and tachy mediated cardiomyopathy who presents today for afib ablation. He is s/p prior aflutter ablation by Dr [name]. Last year, his ejection fraction was 10% and he had significant CHF in the setting of afib with RVR. He has been placed on amiodarone for atrial fibrillation which has improved his symptoms markedly, as well as latest EF documented at 55%, (02/2013). He was scheduled for a radiofrequency ablation for his A. Fib, last fall, and then changed his mind. Today he is here asking to be rescheduled for A. fib ablation. His ultimate goal is to get off his medications. He wants to be very physically active without fear of falling and possible brain bleed. For the most part he is maintaining sinus rhythm with very brief breakthrough A. fib for less than 5 minutes twice a week. Unfortunately he has gained almost 20 pounds in the last year. Very serious discussion regarding catheter ablation be more effective to maintain sinus rhythm with weight loss. Patient will try to lose at least 10 pounds prior to September when the procedure is scheduled. He does have sleep apnea and is wearing CPAP a regular basis.   Past Medical History   Diagnosis  Date   .  Persistent atrial fibrillation  2003   .  OSA on CPAP    .  Onychomycosis    .  Acute systolic heart failure  09/14/2012   .  Atrial flutter      s/p ablation by Dr [name]    Past Surgical History   Procedure  Laterality  Date   .  Mandible surgery       underbite correction   .  Cardioversion  N/A  08/14/2012     Procedure: CARDIOVERSION; Surgeon: Dolores Patty, MD; Location: [facility] OR; Service: Cardiovascular; Laterality: N/A;   .  Atrial flutter ablation   09/14/2012     by Dr [name]    Current Outpatient Prescriptions   Medication  Sig  Dispense  Refill   .  acetaminophen (TYLENOL) 500 MG tablet  Take 1,000 mg by  mouth daily as needed (for Headaches).     Marland Kitchen  amiodarone (PACERONE) 200 MG tablet  Take 200 mg by mouth daily.     .  furosemide (LASIX) 40 MG tablet  Take 1 tablet (40 mg total) by mouth as needed.  30 tablet  3   .  metoprolol tartrate (LOPRESSOR) 25 MG tablet  Take 1 tab in am     .  podofilox (CONDYLOX) 0.5 % gel  Apply topically 2 (two) times daily. As directed  3.5 g  1   .  ramipril (ALTACE) 5 MG capsule  TAKE ONE CAPSULE BY MOUTH ONCE DAILY  30 capsule  0   .  tetrahydrozoline-zinc (VISINE-AC) 0.05-0.25 % ophthalmic solution  Place 1 drop into both eyes 3 (three) times daily as needed (dry eyes).     Carlena Hurl 20 MG TABS tablet  TAKE ONE TABLET BY MOUTH ONCE DAILY  30 tablet  0    No current facility-administered medications for this visit.    Allergies   Allergen  Reactions   .  Amiodarone  Other (See Comments)     IV intolerance with Severe hypotension, shock after amiodarone bolus. Taking po Amiodarone without problems.   .  Fish Allergy  Not shell fish - regular fish causes tingling   .  Metoprolol Succinate  Other (See Comments)     Body aches at high doses. Currently takes two tablets of lopressor  twice daily; three tablets twice daily caused him to ache.    History    Social History   .  Marital Status:  Single     Spouse Name:  N/A     Number of Children:  N/A   .  Years of Education:  N/A    Occupational History   .  [employer]     Social History Main Topics   .  Smoking status:  Former Smoker -- 3 years     Types:  Cigarettes, Cigars     Quit date:  05/12/2009   .  Smokeless tobacco:  Never Used      Comment: 4 cigars daily   .  Alcohol Use:  Yes      Comment: Glass of wine on weekends   .  Drug Use:  Yes     Special:  Marijuana      Comment: occassional use   .  Sexual Activity:  Yes    Other Topics  Concern   .  Not on file    Social History Narrative   .  No narrative on file    Family History   Problem  Relation  Age of Onset   .  Diabetes   Mother    .  Hypertension  Mother    .  Depression  Father    .  Osteoarthritis  Father    .  Alcohol abuse  Father    ROS- All systems are reviewed and negative except as per the HPI above  Physical Exam:  Filed Vitals:   02/15/14 0550  BP: 129/72  Pulse: 76  Temp: 98 F (36.7 C)  Resp: 18    GEN- The patient is overweight appearing, alert and oriented x 3 today.  Head- normocephalic, atraumatic  Eyes- Sclera clear, conjunctiva pink  Ears- hearing intact  Oropharynx- clear  Neck- supple, no JVP  Lymph- no cervical lymphadenopathy  Lungs- Clear to ausculation bilaterally, normal work of breathing  Heart- Regular rate and rhythm, no murmurs, rubs or gallops, PMI not laterally displaced  GI- soft, NT, ND, + BS  Extremities- no clubbing, cyanosis, or edema  MS- no significant deformity or atrophy  Skin- no rash or lesion  Psych- euthymic mood, full affect  Neuro- strength and sensation are intact    Assessment and Plan:  1. Persistent afib  The patient has symptomatic persistent afib though he is now more paroxysmal on amiodarone. He is s/p prior atrial flutter ablation by Dr [name]. He did not have evidence of accessory pathways at that time.  Therapeutic strategies for afib including medicine and ablation were discussed in detail with the patient today. Risk, benefits, and alternatives to EP study and radiofrequency ablation for afib were also discussed in detail today. These risks include but are not limited to stroke, bleeding, vascular damage, tamponade, perforation, damage to the esophagus, lungs, and other structures, pulmonary vein stenosis, worsening renal function, and death. The patient understands these risk and wishes to proceed.  TEE is reviewed and reveals no thrombus.

## 2014-02-15 NOTE — Anesthesia Postprocedure Evaluation (Signed)
  Anesthesia Post-op Note  Patient: Lance [name]  Procedure(s) Performed: Procedure(s): ATRIAL FIBRILLATION ABLATION (N/A)  Patient Location: PACU  Anesthesia Type:General  Level of Consciousness: awake, alert  and oriented  Airway and Oxygen Therapy: Patient Spontanous Breathing and Patient connected to nasal cannula oxygen  Post-op Pain: none  Post-op Assessment: Post-op Vital signs reviewed, Patient's Cardiovascular Status Stable, Respiratory Function Stable, Patent Airway and No signs of Nausea or vomiting  Post-op Vital Signs: Reviewed and stable  Last Vitals:  Filed Vitals:   02/15/14 1157  BP: 107/71  Pulse: 60  Temp:   Resp: 10    Complications: No apparent anesthesia complications

## 2014-02-16 DIAGNOSIS — I4891 Unspecified atrial fibrillation: Secondary | ICD-10-CM | POA: Diagnosis not present

## 2014-02-16 DIAGNOSIS — I5022 Chronic systolic (congestive) heart failure: Secondary | ICD-10-CM | POA: Diagnosis not present

## 2014-02-16 DIAGNOSIS — G4733 Obstructive sleep apnea (adult) (pediatric): Secondary | ICD-10-CM | POA: Diagnosis not present

## 2014-02-16 DIAGNOSIS — I509 Heart failure, unspecified: Secondary | ICD-10-CM | POA: Diagnosis not present

## 2014-02-16 LAB — BASIC METABOLIC PANEL
Anion gap: 12 (ref 5–15)
BUN: 9 mg/dL (ref 6–23)
CO2: 25 meq/L (ref 19–32)
Calcium: 7.9 mg/dL — ABNORMAL LOW (ref 8.4–10.5)
Chloride: 106 mEq/L (ref 96–112)
Creatinine, Ser: 0.99 mg/dL (ref 0.50–1.35)
GFR calc Af Amer: >90 mL/min (ref >90)
GFR calc non Af Amer: >90 mL/min (ref >90)
GLUCOSE: 112 mg/dL — AB (ref 70–99)
POTASSIUM: 4.2 meq/L (ref 3.7–5.3)
Sodium: 143 mEq/L (ref 137–147)

## 2014-02-16 LAB — POCT ACTIVATED CLOTTING TIME
ACTIVATED CLOTTING TIME: 292 s
ACTIVATED CLOTTING TIME: 304 s
Activated Clotting Time: 253 seconds

## 2014-02-16 MED ORDER — PANTOPRAZOLE SODIUM 40 MG PO TBEC: 40.0000 mg | DELAYED_RELEASE_TABLET | Freq: Every day | ORAL | Status: DC

## 2014-02-16 NOTE — Discharge Instructions (Signed)
No driving for 5 days. No lifting over 5 lbs for 1 week. No sexual activity for 1 week. Keep procedure site clean & dry. If you notice increased pain, swelling, bleeding or pus, call/return!  You may shower, but no soaking baths/hot tubs/pools for 1 week.    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 You may return to work on 02/26/14 without restrictions.

## 2014-02-16 NOTE — Progress Notes (Signed)
Patient discharged home with s/o ambulating to vehicle, wheelchair was offered and refused.  PIV d/c'd with pressure dressing applied to site.  Discharge instructions given with all questions and concerns answered.  Patient awake, alert and oriented at time of discharge.

## 2014-02-27 ENCOUNTER — Other Ambulatory Visit: Payer: Self-pay

## 2014-02-27 DIAGNOSIS — M7989 Other specified soft tissue disorders: Secondary | ICD-10-CM

## 2014-02-27 DIAGNOSIS — I83893 Varicose veins of bilateral lower extremities with other complications: Secondary | ICD-10-CM

## 2014-03-02 ENCOUNTER — Telehealth: Payer: Self-pay | Admitting: Internal Medicine

## 2014-03-02 NOTE — Telephone Encounter (Signed)
New message    Patient calling stating he's been having afib everyday wants to know should he restart amiodarone.

## 2014-03-06 NOTE — Telephone Encounter (Signed)
Called patient and he says that he has started having afib every day lasting for hours.  He restarted his Amiodarone today 200mg .  I will discuss with Dr Johney Frame when he returns.  He is going to bring over some paper works

## 2014-03-12 NOTE — Telephone Encounter (Signed)
Discussed with Dr Johney Frame.  He agrees with patient starting Amiodarone 200mg  daily and keeping follow up as scheduled for Dec.  I have left a message for the patient

## 2014-03-14 ENCOUNTER — Telehealth: Payer: Self-pay | Admitting: Internal Medicine

## 2014-03-14 NOTE — Telephone Encounter (Signed)
Walk in Pt Form " Sealed Envelope" Dropped off gave to [facility]  10.7.15/km

## 2014-03-14 NOTE — Telephone Encounter (Signed)
LVM For pt to Return Call about FMLA papers.

## 2014-03-21 ENCOUNTER — Encounter (HOSPITAL_COMMUNITY): Payer: BC Managed Care – PPO

## 2014-03-21 ENCOUNTER — Encounter: Payer: BC Managed Care – PPO | Admitting: Vascular Surgery

## 2014-03-29 ENCOUNTER — Encounter: Payer: Self-pay | Admitting: Vascular Surgery

## 2014-03-30 ENCOUNTER — Encounter: Payer: Self-pay | Admitting: Vascular Surgery

## 2014-03-30 ENCOUNTER — Other Ambulatory Visit: Payer: Self-pay | Admitting: Vascular Surgery

## 2014-03-30 ENCOUNTER — Ambulatory Visit (INDEPENDENT_AMBULATORY_CARE_PROVIDER_SITE_OTHER): Payer: BC Managed Care – PPO | Admitting: Vascular Surgery

## 2014-03-30 ENCOUNTER — Ambulatory Visit (HOSPITAL_COMMUNITY)
Admission: RE | Admit: 2014-03-30 | Discharge: 2014-03-30 | Disposition: A | Discharge status: Home / Self Care | Payer: BC Managed Care – PPO | Source: Ambulatory Visit | Attending: Vascular Surgery | Admitting: Vascular Surgery

## 2014-03-30 VITALS — BP 110/66 | HR 93 | Ht 74.0 in | Wt 290.0 lb

## 2014-03-30 DIAGNOSIS — I872 Venous insufficiency (chronic) (peripheral): Secondary | ICD-10-CM | POA: Insufficient documentation

## 2014-03-30 DIAGNOSIS — M7989 Other specified soft tissue disorders: Secondary | ICD-10-CM

## 2014-03-30 DIAGNOSIS — I83891 Varicose veins of right lower extremities with other complications: Secondary | ICD-10-CM | POA: Insufficient documentation

## 2014-03-30 DIAGNOSIS — I8393 Asymptomatic varicose veins of bilateral lower extremities: Secondary | ICD-10-CM

## 2014-03-30 DIAGNOSIS — I83893 Varicose veins of bilateral lower extremities with other complications: Secondary | ICD-10-CM

## 2014-03-30 NOTE — Progress Notes (Signed)
Referred by:  Tresa Garter, MD [address] [place], [place] [address]  Reason for referral: R leg varicose veins  History of Present Illness  Lance [name] is a 33 y.o. (03-31-1981) male who presents with chief complaint: darkened skin in R leg.  Patient notes, onset of varicose veins years ago, associated with no trigger.  The patient's symptoms include: pruritus and swelling in R foot. The patient has noted increased darkening in skin in R lower leg.   The patient has had no history of DVT, know history of varicose vein, no history of venous stasis ulcers, no history of  Lymphedema and known history of skin changes in lower legs.  There is known family history of venous disorders.  The patient has never used compression stockings in the past.  Past Medical History  Diagnosis Date  . Persistent atrial fibrillation 2003  . OSA on CPAP   . Onychomycosis   . Acute systolic heart failure 09/14/2012  . Atrial flutter     s/p ablation by Dr [name]  . CHF (congestive heart failure)   . Neuromuscular disorder   . Medical history non-contributory   . Coronary artery disease   . Dysrhythmia   . Peripheral vascular disease     Past Surgical History  Procedure Laterality Date  . Mandible surgery      underbite correction  . Cardioversion N/A 08/14/2012    Procedure: CARDIOVERSION;  Surgeon: Dolores Patty, MD;  Location: [facility] OR;  Service: Cardiovascular;  Laterality: N/A;  . Atrial flutter ablation  09/14/2012    by Dr [name]  . Tee without cardioversion N/A 02/14/2014    Procedure: TRANSESOPHAGEAL ECHOCARDIOGRAM (TEE);  Surgeon: Quintella Reichert, MD;  Location: [facility] ENDOSCOPY;  Service: Cardiovascular;  Laterality: N/A;  . Cardiac catheterization      History   Social History  . Marital Status: Single    Spouse Name: N/A    Number of Children: N/A  . Years of Education: N/A   Occupational History  . [employer]    Social History Main Topics  . Smoking status: Former Smoker -- 3  years    Types: Cigarettes, Cigars    Quit date: 05/12/2009  . Smokeless tobacco: Never Used     Comment: 4 cigars daily  . Alcohol Use: Yes     Comment: Glass of wine on weekends  . Drug Use: Yes    Special: Marijuana     Comment: occassional use   . Sexual Activity: Yes   Other Topics Concern  . Not on file   Social History Narrative  . No narrative on file    Family History  Problem Relation Age of Onset  . Diabetes Mother   . Hypertension Mother   . Varicose Veins Mother   . Depression Father   . Osteoarthritis Father   . Alcohol abuse Father     Current Outpatient Prescriptions on File Prior to Visit  Medication Sig Dispense Refill  . acetaminophen (TYLENOL) 500 MG tablet Take 1,000 mg by mouth daily as needed for headache.       . cephALEXin (KEFLEX) 500 MG capsule Take 500 mg by mouth 2 (two) times daily.       . furosemide (LASIX) 40 MG tablet Take 40 mg by mouth daily as needed for fluid.      . metoprolol tartrate (LOPRESSOR) 25 MG tablet Take 25 mg by mouth daily.      . pantoprazole (PROTONIX) 40  MG tablet Take 1 tablet (40 mg total) by mouth daily.  60 tablet  0  . ramipril (ALTACE) 5 MG capsule Take 5 mg by mouth daily.      . rivaroxaban (XARELTO) 20 MG TABS tablet Take 20 mg by mouth daily with supper.      . tetrahydrozoline-zinc (VISINE-AC) 0.05-0.25 % ophthalmic solution Place 1 drop into both eyes 3 (three) times daily as needed (dry eyes).       No current facility-administered medications on file prior to visit.    Allergies  Allergen Reactions  . Amiodarone Other (See Comments)    IV intolerance with Severe hypotension, shock after amiodarone bolus.  Taking po Amiodarone without problems.  . Fish Allergy Other (See Comments)    Not shell fish - regular fish causes tingling  . Metoprolol Succinate Other (See Comments)    Body aches at high doses.  Currently takes two tablets of lopressor 25mg  twice daily; three tablets twice daily caused him to  ache.    REVIEW OF SYSTEMS:  (Positives checked otherwise negative)  CARDIOVASCULAR:  []  chest pain, []  chest pressure, []  palpitations, []  shortness of breath when laying flat, []  shortness of breath with exertion,  []  pain in feet when walking, []  pain in feet when laying flat, []  history of blood clot in veins (DVT), []  history of phlebitis, []  swelling in legs, [x]  varicose veins  PULMONARY:  []  productive cough, []  asthma, []  wheezing  NEUROLOGIC:  []  weakness in arms or legs, []  numbness in arms or legs, []  difficulty speaking or slurred speech, []  temporary loss of vision in one eye, []  dizziness  HEMATOLOGIC:  []  bleeding problems, []  problems with blood clotting too easily  MUSCULOSKEL:  []  joint pain, []  joint swelling  GASTROINTEST:  []  vomiting blood, []  blood in stool     GENITOURINARY:  []  burning with urination, []  blood in urine  PSYCHIATRIC:  []  history of major depression  INTEGUMENTARY:  []  rashes, []  ulcers  CONSTITUTIONAL:  []  fever, []  chills   Physical Examination Filed Vitals:   03/30/14 1128  BP: 110/66  Pulse: 93  Height: 6\' 2"  (1.88 m)  Weight: 290 lb (131.543 kg)  SpO2: 99%   Body mass index is 37.22 kg/(m^2).  General: A&O x 3, WD, mildly obese  Head: [place]/AT  Ear/Nose/Throat: Hearing grossly intact, nares w/o erythema or drainage, oropharynx w/o Erythema/Exudate  Eyes: PERRLA, EOMI  Neck: Supple, no nuchal rigidity, no palpable LAD  Pulmonary: Sym exp, good air movt, CTAB, no rales, rhonchi, & wheezing  Cardiac: RRR, Nl S1, S2, no Murmurs, rubs or gallops  Vascular: Vessel Right Left  Radial Palpable Palpable  Brachial Palpable Palpable  Carotid Palpable, without bruit Palpable, without bruit  Aorta Not palpable N/A  Femoral Palpable Palpable  Popliteal Not palpable Not palpable  PT Palpable Palpable  DP Palpable Palpable   Gastrointestinal: soft, NTND, -G/R, - HSM, - masses, - CVAT B  Musculoskeletal: M/S 5/5 throughout ,  Extremities without ischemic changes , R calf varicosities easily visible and palpable, R>>L LDS, RLE 1+ edema  Neurologic: CN 2-12 intact , Pain and light touch intact in extremities , Motor exam as listed above  Psychiatric: Judgment intact, Mood & affect appropriate for pt's clinical situation  Dermatologic: See M/S exam for extremity exam, no rashes otherwise noted  Lymph : No Cervical, Axillary, or Inguinal lymphadenopathy   Non-Invasive Vascular Imaging  RLE Venous Insufficiency Duplex (Date: 03/30/2014):   No DVT and  SVT,   + GSV reflux: throughout  + deep venous reflux: CFV, SFV, pop  Incompetent calf perforator  Outside Studies/Documentation 4 pages of outside documents were reviewed including: outpatient PCP chart.  Medical Decision Making  Lance [name] is a 33 y.o. male who presents with: BLE chronic venous insufficiency (C4), B varicose veins with complications   Based on the patient's history and examination, I recommend:compressive therapy.  I discussed with the patient the use of her 20-30 mm thigh high compression stockings and need for 3 month trial of such.  The patient will follow up in 3 months with my partners in the [facility] for evaluation for: EVLA R GSV.  Thank you for allowing [place] to participate in this patient's care.  Leonides Sake, MD [facility] Office: [phone] Pager: [phone]  03/30/2014, 11:52 AM

## 2014-05-17 ENCOUNTER — Encounter (HOSPITAL_COMMUNITY): Payer: Self-pay | Admitting: Internal Medicine

## 2014-05-28 ENCOUNTER — Ambulatory Visit (INDEPENDENT_AMBULATORY_CARE_PROVIDER_SITE_OTHER): Payer: BC Managed Care – PPO | Admitting: Internal Medicine

## 2014-05-28 ENCOUNTER — Encounter: Payer: Self-pay | Admitting: Internal Medicine

## 2014-05-28 VITALS — BP 124/78 | HR 68 | Ht 74.0 in | Wt 296.0 lb

## 2014-05-28 DIAGNOSIS — I4819 Other persistent atrial fibrillation: Secondary | ICD-10-CM

## 2014-05-28 DIAGNOSIS — G4733 Obstructive sleep apnea (adult) (pediatric): Secondary | ICD-10-CM

## 2014-05-28 DIAGNOSIS — I5022 Chronic systolic (congestive) heart failure: Secondary | ICD-10-CM

## 2014-05-28 DIAGNOSIS — I481 Persistent atrial fibrillation: Secondary | ICD-10-CM

## 2014-05-28 MED ORDER — FUROSEMIDE 40 MG PO TABS: 40.0000 mg | ORAL_TABLET | Freq: Every day | ORAL | Status: DC | PRN

## 2014-05-28 NOTE — Progress Notes (Signed)
Primary Care Physician: Sonda PrimesAlex Plotnikov, MD Primary EP:  Dr Andrena Mewsaylor   Lance [name] is a 33 y.o. male with a h/o persistent afib and tachy mediated cardiomyopathy who presents today for follow-up.  He has done well since his AF ablation 9/15.  He denies procedure related complications and is pleased with the results of his procedure.  He has had no symptoms of AF.  He has not been diligent with weight reduction strategies.  He says that when he tried to exercise that his heart "pounds".  He is fearful to exercise. Today, he denies CP, SOB, dizzyness, presyncope, syncope, or other concerns.  Past Medical History  Diagnosis Date  . Persistent atrial fibrillation 2003  . OSA on CPAP   . Onychomycosis   . Acute systolic heart failure 09/14/2012  . Atrial flutter     s/p ablation by Dr Ladona Ridgelaylor  . CHF (congestive heart failure)   . Neuromuscular disorder   . Medical history non-contributory   . Coronary artery disease   . Dysrhythmia   . Peripheral vascular disease    Past Surgical History  Procedure Laterality Date  . Mandible surgery      underbite correction  . Cardioversion N/A 08/14/2012    Procedure: CARDIOVERSION;  Surgeon: Dolores Pattyaniel R Bensimhon, MD;  [facility];  Service: Cardiovascular;  Laterality: N/A;  . Atrial flutter ablation  09/14/2012    by Dr Ladona Ridgelaylor  . Tee without cardioversion N/A 02/14/2014    Procedure: TRANSESOPHAGEAL ECHOCARDIOGRAM (TEE);  Surgeon: Quintella Reichertraci R Turner, MD;  [facility];  Service: Cardiovascular;  Laterality: N/A;  . Cardiac catheterization    . Atrial flutter ablation N/A 09/14/2012    Procedure: ATRIAL FLUTTER ABLATION;  Surgeon: Marinus MawGregg W Taylor, MD;  [facility];  Service: Cardiovascular;  Laterality: N/A;  . Atrial fibrillation ablation N/A 02/15/2014    Procedure: ATRIAL FIBRILLATION ABLATION;  Surgeon: Gardiner RhymeJames D Missy Baksh, MD;  Location: [facility] CATH LAB;  Service: Cardiovascular;  Laterality: N/A;    Current Outpatient Prescriptions    Medication Sig Dispense Refill  . acetaminophen (TYLENOL) 500 MG tablet Take 1,000 mg by mouth daily as needed for headache.     . furosemide (LASIX) 40 MG tablet Take 1 tablet (40 mg total) by mouth daily as needed for fluid. 30 tablet 3  . metoprolol tartrate (LOPRESSOR) 25 MG tablet Take 25 mg by mouth daily.    . ramipril (ALTACE) 5 MG capsule Take 5 mg by mouth daily.    Marland Kitchen. tetrahydrozoline-zinc (VISINE-AC) 0.05-0.25 % ophthalmic solution Place 1 drop into both eyes 3 (three) times daily as needed (dry eyes).     No current facility-administered medications for this visit.    Allergies  Allergen Reactions  . Amiodarone Other (See Comments)    IV intolerance with Severe hypotension, shock after amiodarone bolus.  Taking po Amiodarone without problems.  . Protonix [Pantoprazole Sodium]     Couldn't belch  . Fish Allergy Other (See Comments)    Not shell fish - regular fish causes tingling  . Metoprolol Succinate Other (See Comments)    Body aches at high doses.  Currently takes two tablets of lopressor 25mg  twice daily; three tablets twice daily caused him to ache.    History   Social History  . Marital Status: Single    Spouse Name: N/A    Number of Children: N/A  . Years of Education: N/A   Occupational History  . [employer]    Social History Main  Topics  . Smoking status: Former Smoker -- 3 years    Types: Cigarettes, Cigars    Quit date: 05/12/2009  . Smokeless tobacco: Never Used     Comment: 4 cigars daily  . Alcohol Use: Yes     Comment: Glass of wine on weekends  . Drug Use: Yes    Special: Marijuana     Comment: occassional use   . Sexual Activity: Yes   Other Topics Concern  . Not on file   Social History Narrative    Family History  Problem Relation Age of Onset  . Diabetes Mother   . Hypertension Mother   . Varicose Veins Mother   . Depression Father   . Osteoarthritis Father   . Alcohol abuse Father     ROS- All systems are reviewed and  negative except as per the HPI above  Physical Exam: Filed Vitals:   05/28/14 1557  BP: 124/78  Pulse: 68  Height: 6\' 2"  (1.88 m)  Weight: 296 lb (134.265 kg)    GEN- The patient is overweight appearing, alert and oriented x 3 today.   Head- normocephalic, atraumatic Eyes-  Sclera clear, conjunctiva pink Ears- hearing intact Oropharynx- clear Neck- supple, no JVP Lymph- no cervical lymphadenopathy Lungs- Clear to ausculation bilaterally, normal work of breathing Heart- Regular rate and rhythm, no murmurs, rubs or gallops, PMI not laterally displaced GI- soft, NT, ND, + BS Extremities- no clubbing, cyanosis, or edema MS- no significant deformity or atrophy Skin- no rash or lesion Psych- euthymic mood, full affect Neuro- strength and sensation are intact  EKG today reveals sinus rhythm, incomplete RBBB  Assessment and Plan:  1. Persistent afib Doing well s/p ablation Stop amiodarone and xarelto The importance of lifestyle modification (according to [facility] AF data) was discussed today including regular exercise and weight reduction. He is afraid to exercise due to chest tightness and "heart pounding".  I will therefore order a plain old treadmill to evaluate his exercise effort and exclude arrhythmias.  IF this is normal then hopefully he will feel more confident exercising.  My suspicion for ischemic is low and therefore I do not feel that nuclear imaging is required despite abnormal baseline ekg.  2. OSA Compliance with CPAP is advised.  3. Nonischemic CM Rate control/ rhythm control is important long term.  Return in 3 months for follow-up

## 2014-05-28 NOTE — Patient Instructions (Signed)
Your physician recommends that you schedule a follow-up appointment in: 3 months with Dr Johney Frame  Your physician has requested that you have an exercise tolerance test. For further information please visit [email or web address] Please also follow instruction sheet, as given.  Your physician has recommended you make the following change in your medication:  1) Stop Xarelto 2) Stop Amiodarone

## 2014-07-02 ENCOUNTER — Encounter: Payer: Self-pay | Admitting: Family

## 2014-07-02 ENCOUNTER — Encounter: Payer: Self-pay | Admitting: Vascular Surgery

## 2014-07-03 ENCOUNTER — Ambulatory Visit: Payer: BC Managed Care – PPO | Admitting: Vascular Surgery

## 2014-07-12 ENCOUNTER — Ambulatory Visit: Payer: BC Managed Care – PPO | Admitting: Physician Assistant

## 2014-07-12 ENCOUNTER — Encounter: Payer: Self-pay | Admitting: Physician Assistant

## 2014-08-02 ENCOUNTER — Ambulatory Visit: Payer: BC Managed Care – PPO | Admitting: Internal Medicine

## 2014-08-24 ENCOUNTER — Encounter: Payer: Self-pay | Admitting: Internal Medicine

## 2014-08-24 ENCOUNTER — Ambulatory Visit (INDEPENDENT_AMBULATORY_CARE_PROVIDER_SITE_OTHER): Payer: BLUE CROSS/BLUE SHIELD | Admitting: Internal Medicine

## 2014-08-24 VITALS — BP 102/60 | HR 85 | Temp 98.3°F | Resp 16 | Ht 74.0 in | Wt 293.5 lb

## 2014-08-24 DIAGNOSIS — J45901 Unspecified asthma with (acute) exacerbation: Secondary | ICD-10-CM

## 2014-08-24 DIAGNOSIS — J069 Acute upper respiratory infection, unspecified: Secondary | ICD-10-CM

## 2014-08-24 MED ORDER — AMOXICILLIN 500 MG PO CAPS: 500.0000 mg | ORAL_CAPSULE | Freq: Three times a day (TID) | ORAL | Status: DC

## 2014-08-24 MED ORDER — PREDNISONE 20 MG PO TABS: 20.0000 mg | ORAL_TABLET | Freq: Two times a day (BID) | ORAL | Status: DC

## 2014-08-24 NOTE — Progress Notes (Signed)
   Subjective:    Patient ID: Lance [name], male    DOB: 1980/11/27, 34 y.o.   MRN: [id number]  HPI His symptoms began 08/18/14 as head congestion and scratchy sore throat.  As of 3/14 he developed cough which progressed to severe paroxysms. He had associated sweats and chills. He noted wheezing as well. He has scant yellow secretions from the head and chest. Sputum is in form of small plugs.  He's had facial pressure which has improved somewhat. He's using Zicam, Tylenol Severe Cold, and OTC cough drops.  He has quit smoking 2010. Asthma only  as a child. He is on ACE inhibitor. He has a past history of paroxysmal atrial fib/flutter .  Review of Systems He denies fever, frontal headache, facial pain, nasal purulence, shortness of breath, or itchy/watery eyes.  Chest pain, palpitations, tachycardia,  paroxysmal nocturnal dyspnea, claudication or edema are absent.      Objective:   Physical Exam  Pertinent or positive findings include: He has a beard and mustache. His head is shaven.  Severe septal erythema present. He has diffuse inspiratory rhonchi and wheezes in all lung fields which tends to be homogenous.  General appearance:Adequately nourished; no acute distress or increased work of breathing is present. BMI:37.67  No  lymphadenopathy about the head, neck, or axilla noted.  Eyes: No conjunctival inflammation or lid edema is present. There is no scleral icterus. Ears:  External ear exam shows no significant lesions or deformities.  Otoscopic examination reveals clear canals, tympanic membranes are intact bilaterally without bulging, retraction, inflammation or discharge. Nose:  External nasal examination shows no deformity or inflammation. No septal dislocation or deviation.No obstruction to airflow.  Oral exam: Dental hygiene is good; lips and gums are healthy appearing.There is no oropharyngeal erythema or exudate noted.  Neck:  No deformities, thyromegaly, masses, or tenderness  noted.   Supple with full range of motion without pain.  Heart:  Normal rate and regular rhythm. S1 and S2 normal without gallop, murmur, click, rub or other extra sounds.  Extremities:  No cyanosis, edema, or clubbing  noted  Skin: Warm & dry w/o tenting.      Assessment & Plan:  #1 asthmatic bronchitis  #2 upper respiratory tract infection  Plan: See orders and recommendations

## 2014-08-24 NOTE — Patient Instructions (Signed)
Breo one inhalation daily; gargle and spit after use. Lot #:[id number] Exp 08/17  Plain Mucinex (NOT D) for thick secretions ;force NON dairy fluids .   Nasal cleansing in the shower as discussed with lather of mild shampoo.After 10 seconds wash off lather while  exhaling through nostrils. Make sure that all residual soap is removed to prevent irritation.  Flonase OR Nasacort AQ 1 spray in each nostril twice a day as needed. Use the "crossover" technique into opposite nostril spraying toward opposite ear @ 45 degree angle, not straight up into nostril.  Plain Allegra (NOT D )  160 daily , Loratidine 10 mg , OR Zyrtec 10 mg @ bedtime  as needed for itchy eyes & sneezing.

## 2014-08-24 NOTE — Progress Notes (Signed)
Pre visit review using our clinic review tool, if applicable. No additional management support is needed unless otherwise documented below in the visit note. 

## 2014-09-12 ENCOUNTER — Other Ambulatory Visit: Payer: Self-pay | Admitting: Internal Medicine

## 2014-09-13 NOTE — Telephone Encounter (Signed)
Hillis Range, MD at 05/28/2014 5:43 PM  ramipril (ALTACE) 5 MG capsuleTake 5 mg by mouth daily Your physician has recommended you make the following change in your medication:  1) Stop Xarelto 2) Stop Amiodarone

## 2014-09-17 ENCOUNTER — Ambulatory Visit (INDEPENDENT_AMBULATORY_CARE_PROVIDER_SITE_OTHER): Payer: BLUE CROSS/BLUE SHIELD | Admitting: Internal Medicine

## 2014-09-17 ENCOUNTER — Encounter: Payer: Self-pay | Admitting: Internal Medicine

## 2014-09-17 VITALS — BP 110/76 | HR 89 | Temp 98.3°F | Wt 295.0 lb

## 2014-09-17 DIAGNOSIS — J020 Streptococcal pharyngitis: Secondary | ICD-10-CM | POA: Diagnosis not present

## 2014-09-17 DIAGNOSIS — J029 Acute pharyngitis, unspecified: Secondary | ICD-10-CM

## 2014-09-17 MED ORDER — AZITHROMYCIN 250 MG PO TABS: ORAL_TABLET | ORAL | Status: DC

## 2014-09-17 MED ORDER — TRAMADOL HCL 50 MG PO TABS: 50.0000 mg | ORAL_TABLET | Freq: Two times a day (BID) | ORAL | Status: DC | PRN

## 2014-09-17 NOTE — Progress Notes (Signed)
Pre visit review using our clinic review tool, if applicable. No additional management support is needed unless otherwise documented below in the visit note. 

## 2014-09-17 NOTE — Assessment & Plan Note (Addendum)
Z pac Labs if not better in 3 d Tramadol prn Off work. Sick 2/8 - now. To work 4/12 if ok

## 2014-09-17 NOTE — Progress Notes (Signed)
Subjective:    Sore Throat  This is a new problem. The current episode started in the past 7 days. Neither side of throat is experiencing more pain than the other. There has been no fever. The pain is severe. Pertinent negatives include no congestion, coughing, diarrhea, drooling, hoarse voice, neck pain, stridor or trouble swallowing.    The patient is here for a wellness exam.   The patient needs to address  chronic hypertension that has been well controlled with medicines; to address chronic  A fib controlled with medicines as well; and to address h/o CHF, controlled with medical treatment.   F/u OSA  Not using CPAP  Wt Readings from Last 3 Encounters:  09/17/14 295 lb (133.811 kg)  08/24/14 293 lb 8 oz (133.131 kg)  05/28/14 296 lb (134.265 kg)   BP Readings from Last 3 Encounters:  09/17/14 110/76  08/24/14 102/60  05/28/14 124/78      Review of Systems  Constitutional: Negative for appetite change, fatigue and unexpected weight change.  HENT: Positive for hearing loss. Negative for congestion, drooling, hoarse voice, nosebleeds, sneezing, sore throat and trouble swallowing.   Eyes: Negative for itching and visual disturbance.  Respiratory: Negative for cough and stridor.   Cardiovascular: Negative for chest pain, palpitations and leg swelling.  Gastrointestinal: Negative for nausea, diarrhea, blood in stool and abdominal distention.  Genitourinary: Negative for frequency and hematuria.  Musculoskeletal: Negative for myalgias, back pain, joint swelling, gait problem and neck pain.  Skin: Negative for rash.  Neurological: Negative for dizziness, tremors, speech difficulty and weakness.  Psychiatric/Behavioral: Negative for suicidal ideas, sleep disturbance, dysphoric mood and agitation. The patient is not nervous/anxious.        Objective:   Physical Exam  Constitutional: He is oriented to person, place, and time. He appears well-developed. No distress.  NAD Obese   HENT:  Mouth/Throat: Oropharyngeal exudate present.  Eyes: Conjunctivae are normal. Pupils are equal, round, and reactive to light.  Neck: Normal range of motion. No JVD present. No thyromegaly present.  Cardiovascular: Normal rate and intact distal pulses.  Exam reveals no gallop and no friction rub.   No murmur heard. irreg irreg  Pulmonary/Chest: Effort normal and breath sounds normal. No respiratory distress. He has no wheezes. He has no rales. He exhibits no tenderness.  Abdominal: Soft. Bowel sounds are normal. He exhibits no distension and no mass. There is no tenderness. There is no rebound and no guarding.  Genitourinary: Penis normal.  1 wart on penile shaft L and 1 on scrotum L  Musculoskeletal: Normal range of motion. He exhibits edema (R ankle is a little swollen w/varicies). He exhibits no tenderness.  Lymphadenopathy:    He has no cervical adenopathy.  Neurological: He is alert and oriented to person, place, and time. He has normal reflexes. No cranial nerve deficit. He exhibits normal muscle tone. He displays a negative Romberg sign. Coordination and gait normal.  No meningeal signs  Skin: Skin is warm and dry. No rash noted.  Psychiatric: He has a normal mood and affect. His behavior is normal. Judgment and thought content normal.  cyst on R upper eyelid  eryth throat, swollen uvula, mild white exsudate No thrush Rapid strep (-)  Lab Results  Component Value Date   WBC 6.0 02/05/2014   HGB 14.4 02/05/2014   HCT 42.7 02/05/2014   PLT 217.0 02/05/2014   GLUCOSE 112* 02/16/2014   CHOL 138 02/05/2014   TRIG 61.0 02/05/2014   HDL  41.70 02/05/2014   LDLCALC 84 02/05/2014   ALT 31 02/05/2014   AST 24 02/05/2014   NA 143 02/16/2014   K 4.2 02/16/2014   CL 106 02/16/2014   CREATININE 0.99 02/16/2014   BUN 9 02/16/2014   CO2 25 02/16/2014   TSH 1.14 02/05/2014   PSA 0.45 02/01/2013   INR 1.46 08/22/2012       Assessment & Plan:

## 2014-09-26 ENCOUNTER — Telehealth: Payer: Self-pay | Admitting: Internal Medicine

## 2014-09-26 NOTE — Telephone Encounter (Signed)
Left message on pt voicemail that letter for work is at front desk ready for pick up per patient request

## 2014-09-26 NOTE — Telephone Encounter (Signed)
Are you ok with giving patient a letter for being out of work for these dates

## 2014-09-26 NOTE — Telephone Encounter (Signed)
Letter printed and at front desk for patient pick-up.

## 2014-09-26 NOTE — Telephone Encounter (Signed)
Pt called in and needs a new work note and it needs to say   Out Sat April 9th - Thur 14th .  Pt wants to come by and pick it up.    Best number  [phone]- leave a message

## 2014-09-26 NOTE — Telephone Encounter (Signed)
Ok Thx 

## 2015-01-04 ENCOUNTER — Encounter: Payer: Self-pay | Admitting: Family

## 2015-01-04 ENCOUNTER — Ambulatory Visit (INDEPENDENT_AMBULATORY_CARE_PROVIDER_SITE_OTHER): Payer: BLUE CROSS/BLUE SHIELD | Admitting: Family

## 2015-01-04 VITALS — BP 110/78 | HR 91 | Temp 97.9°F | Resp 18 | Ht 74.0 in | Wt 284.4 lb

## 2015-01-04 DIAGNOSIS — J209 Acute bronchitis, unspecified: Secondary | ICD-10-CM | POA: Diagnosis not present

## 2015-01-04 MED ORDER — FLUTICASONE FUROATE-VILANTEROL 100-25 MCG/INH IN AEPB: 1.0000 | INHALATION_SPRAY | Freq: Every day | RESPIRATORY_TRACT | Status: DC | PRN

## 2015-01-04 MED ORDER — LEVOFLOXACIN 500 MG PO TABS: 500.0000 mg | ORAL_TABLET | Freq: Every day | ORAL | Status: DC

## 2015-01-04 NOTE — Progress Notes (Signed)
Subjective:    Patient ID: Lance [name], male    DOB: 29-Oct-1980, 34 y.o.   MRN: [id number]  Chief Complaint  Patient presents with  . Cough    x2 weeks, productive cough, congestion, drainage, started as sore throat    HPI:  Lance [name] is a 34 y.o. male with a PMH of venous insufficiency, acute systolic heart failure, atrial flutter, pleurisy, and sleep apnea who presents today for an acute office visit.   This is a new problem. Associated symptoms of productive cough, congestion, drainage and sore throat for about 2 weeks. Notes that he originally felt improved and then slightly worsened. Modifying factors include Nyquil, orange juice and vapor rub which have helped a little with his symptoms but not relieve his symptoms completed. Timing of symptoms is constant throughout the day. Recently used amoxicillin and azithromycin. Notes the azithromycin made him itch.  Allergies  Allergen Reactions  . Amiodarone Other (See Comments)    IV intolerance with Severe hypotension, shock after amiodarone bolus.  Taking po Amiodarone without problems.  . Azithromycin Itching  . Protonix [Pantoprazole Sodium]     Couldn't belch  . Fish Allergy Other (See Comments)    Not shell fish - regular fish causes tingling  . Metoprolol Succinate Other (See Comments)    Body aches at high doses.  Currently takes two tablets of lopressor 25mg  twice daily; three tablets twice daily caused him to ache.    Current Outpatient Prescriptions on File Prior to Visit  Medication Sig Dispense Refill  . acetaminophen (TYLENOL) 500 MG tablet Take 1,000 mg by mouth daily as needed for headache.     . furosemide (LASIX) 40 MG tablet Take 1 tablet (40 mg total) by mouth daily as needed for fluid. 30 tablet 3  . metoprolol tartrate (LOPRESSOR) 25 MG tablet Take 25 mg by mouth daily.    . ramipril (ALTACE) 5 MG capsule TAKE ONE CAPSULE BY MOUTH ONCE DAILY 90 capsule 1  . tetrahydrozoline-zinc (VISINE-AC) 0.05-0.25  % ophthalmic solution Place 1 drop into both eyes 3 (three) times daily as needed (dry eyes).    . traMADol (ULTRAM) 50 MG tablet Take 1-2 tablets (50-100 mg total) by mouth every 12 (twelve) hours as needed for moderate pain or severe pain. 30 tablet 0   No current facility-administered medications on file prior to visit.    Past Medical History  Diagnosis Date  . Persistent atrial fibrillation 2003  . OSA on CPAP   . Onychomycosis   . Acute systolic heart failure 09/14/2012  . Atrial flutter     s/p ablation by Dr [name]  . CHF (congestive heart failure)   . Neuromuscular disorder   . Medical history non-contributory   . Coronary artery disease   . Dysrhythmia   . Peripheral vascular disease      Review of Systems  Constitutional: Negative for fever and chills.  HENT: Positive for congestion and sore throat.   Respiratory: Positive for cough. Negative for chest tightness and shortness of breath.       Objective:    BP 110/78 mmHg  Pulse 91  Temp(Src) 97.9 F (36.6 C) (Oral)  Resp 18  Ht 6\' 2"  (1.88 m)  Wt 284 lb 6.4 oz (129.003 kg)  BMI 36.50 kg/m2  SpO2 97% Nursing note and vital signs reviewed.  Physical Exam  Constitutional: He is oriented to person, place, and time. He appears well-developed and well-nourished. No distress.  HENT:  Right  Ear: Hearing, tympanic membrane, external ear and ear canal normal.  Left Ear: Hearing, tympanic membrane, external ear and ear canal normal.  Nose: Nose normal. Right sinus exhibits no maxillary sinus tenderness and no frontal sinus tenderness. Left sinus exhibits no maxillary sinus tenderness and no frontal sinus tenderness.  Mouth/Throat: Uvula is midline, oropharynx is clear and moist and mucous membranes are normal.  Cardiovascular: Normal rate, regular rhythm, normal heart sounds and intact distal pulses.   Pulmonary/Chest: Effort normal. He has wheezes.  Neurological: He is alert and oriented to person, place, and time.    Skin: Skin is warm and dry.  Psychiatric: He has a normal mood and affect. His behavior is normal. Judgment and thought content normal.       Assessment & Plan:   Problem List Items Addressed This Visit      Respiratory   Acute bronchitis - Primary    Symptoms and exam consistent with acute bronchitis. Start Levaquin. Start Breo as needed for cough - sample provided. Continue over-the-counter medications as needed for symptom relief and supportive care. Follow-up if symptoms worsen or fail to improve.      Relevant Medications   levofloxacin (LEVAQUIN) 500 MG tablet   Fluticasone Furoate-Vilanterol (BREO ELLIPTA) 100-25 MCG/INH AEPB

## 2015-01-04 NOTE — Assessment & Plan Note (Addendum)
Symptoms and exam consistent with acute bronchitis. Start Levaquin. Start Breo as needed for cough - sample provided. Continue over-the-counter medications as needed for symptom relief and supportive care. Follow-up if symptoms worsen or fail to improve.

## 2015-01-04 NOTE — Patient Instructions (Addendum)
Thank you for choosing [facility].  Summary/Instructions:  Your prescription(s) have been submitted to your pharmacy or been printed and provided for you. Please take as directed and contact our office if you believe you are having problem(s) with the medication(s) or have any questions.  If your symptoms worsen or fail to improve, please contact our office for further instruction, or in case of emergency go directly to the emergency room at the closest medical facility.    Acute Bronchitis Bronchitis is inflammation of the airways that extend from the windpipe into the lungs (bronchi). The inflammation often causes mucus to develop. This leads to a cough, which is the most common symptom of bronchitis.  In acute bronchitis, the condition usually develops suddenly and goes away over time, usually in a couple weeks. Smoking, allergies, and asthma can make bronchitis worse. Repeated episodes of bronchitis may cause further lung problems.  CAUSES Acute bronchitis is most often caused by the same virus that causes a cold. The virus can spread from person to person (contagious) through coughing, sneezing, and touching contaminated objects. SIGNS AND SYMPTOMS   Cough.   Fever.   Coughing up mucus.   Body aches.   Chest congestion.   Chills.   Shortness of breath.   Sore throat.  DIAGNOSIS  Acute bronchitis is usually diagnosed through a physical exam. Your health care provider will also ask you questions about your medical history. Tests, such as chest X-rays, are sometimes done to rule out other conditions.  TREATMENT  Acute bronchitis usually goes away in a couple weeks. Oftentimes, no medical treatment is necessary. Medicines are sometimes given for relief of fever or cough. Antibiotic medicines are usually not needed but may be prescribed in certain situations. In some cases, an inhaler may be recommended to help reduce shortness of breath and control the cough. A cool  mist vaporizer may also be used to help thin bronchial secretions and make it easier to clear the chest.  HOME CARE INSTRUCTIONS  Get plenty of rest.   Drink enough fluids to keep your urine clear or pale yellow (unless you have a medical condition that requires fluid restriction). Increasing fluids may help thin your respiratory secretions (sputum) and reduce chest congestion, and it will prevent dehydration.   Take medicines only as directed by your health care provider.  If you were prescribed an antibiotic medicine, finish it all even if you start to feel better.  Avoid smoking and secondhand smoke. Exposure to cigarette smoke or irritating chemicals will make bronchitis worse. If you are a smoker, consider using nicotine gum or skin patches to help control withdrawal symptoms. Quitting smoking will help your lungs heal faster.   Reduce the chances of another bout of acute bronchitis by washing your hands frequently, avoiding people with cold symptoms, and trying not to touch your hands to your mouth, nose, or eyes.   Keep all follow-up visits as directed by your health care provider.  SEEK MEDICAL CARE IF: Your symptoms do not improve after 1 week of treatment.  SEEK IMMEDIATE MEDICAL CARE IF:  You develop an increased fever or chills.   You have chest pain.   You have severe shortness of breath.  You have bloody sputum.   You develop dehydration.  You faint or repeatedly feel like you are going to pass out.  You develop repeated vomiting.  You develop a severe headache. MAKE SURE YOU:   Understand these instructions.  Will watch your condition.  Will   get help right away if you are not doing well or get worse. Document Released: 07/02/2004 Document Revised: 10/09/2013 Document Reviewed: 11/15/2012 Exit[employer] Patient Information 2015 [employer]. This information is not intended to replace advice given to you by your health care provider. Make sure you discuss  any questions you have with your health care provider.  

## 2015-01-04 NOTE — Progress Notes (Signed)
Pre visit review using our clinic review tool, if applicable. No additional management support is needed unless otherwise documented below in the visit note. 

## 2015-02-08 ENCOUNTER — Other Ambulatory Visit (INDEPENDENT_AMBULATORY_CARE_PROVIDER_SITE_OTHER): Payer: BLUE CROSS/BLUE SHIELD

## 2015-02-08 ENCOUNTER — Ambulatory Visit (INDEPENDENT_AMBULATORY_CARE_PROVIDER_SITE_OTHER)
Admission: RE | Admit: 2015-02-08 | Discharge: 2015-02-08 | Disposition: A | Discharge status: Home / Self Care | Payer: BLUE CROSS/BLUE SHIELD | Source: Ambulatory Visit | Attending: Internal Medicine | Admitting: Internal Medicine

## 2015-02-08 ENCOUNTER — Encounter: Payer: Self-pay | Admitting: Internal Medicine

## 2015-02-08 ENCOUNTER — Ambulatory Visit (INDEPENDENT_AMBULATORY_CARE_PROVIDER_SITE_OTHER): Payer: BLUE CROSS/BLUE SHIELD | Admitting: Internal Medicine

## 2015-02-08 VITALS — BP 102/70 | HR 75 | Ht 74.0 in | Wt 297.0 lb

## 2015-02-08 DIAGNOSIS — R05 Cough: Secondary | ICD-10-CM | POA: Diagnosis not present

## 2015-02-08 DIAGNOSIS — R059 Cough, unspecified: Secondary | ICD-10-CM

## 2015-02-08 DIAGNOSIS — R7989 Other specified abnormal findings of blood chemistry: Secondary | ICD-10-CM

## 2015-02-08 DIAGNOSIS — I872 Venous insufficiency (chronic) (peripheral): Secondary | ICD-10-CM | POA: Diagnosis not present

## 2015-02-08 DIAGNOSIS — Z0000 Encounter for general adult medical examination without abnormal findings: Secondary | ICD-10-CM | POA: Diagnosis not present

## 2015-02-08 DIAGNOSIS — Z23 Encounter for immunization: Secondary | ICD-10-CM | POA: Diagnosis not present

## 2015-02-08 LAB — CBC WITH DIFFERENTIAL/PLATELET
Basophils Absolute: 0 10*3/uL (ref 0.0–0.1)
Basophils Relative: 0.3 % (ref 0.0–3.0)
EOS ABS: 0.1 10*3/uL (ref 0.0–0.7)
EOS PCT: 1.7 % (ref 0.0–5.0)
HCT: 46.2 % (ref 39.0–52.0)
Hemoglobin: 15.6 g/dL (ref 13.0–17.0)
LYMPHS ABS: 2 10*3/uL (ref 0.7–4.0)
Lymphocytes Relative: 22.7 % (ref 12.0–46.0)
MCHC: 33.8 g/dL (ref 30.0–36.0)
MCV: 85.7 fl (ref 78.0–100.0)
MONO ABS: 0.5 10*3/uL (ref 0.1–1.0)
Monocytes Relative: 5.5 % (ref 3.0–12.0)
NEUTROS PCT: 69.8 % (ref 43.0–77.0)
Neutro Abs: 6.2 10*3/uL (ref 1.4–7.7)
Platelets: 250 10*3/uL (ref 150.0–400.0)
RBC: 5.39 Mil/uL (ref 4.22–5.81)
RDW: 14.4 % (ref 11.5–15.5)
WBC: 8.8 10*3/uL (ref 4.0–10.5)

## 2015-02-08 LAB — URINALYSIS
Bilirubin Urine: NEGATIVE
Hgb urine dipstick: NEGATIVE
Ketones, ur: NEGATIVE
Leukocytes, UA: NEGATIVE
Nitrite: NEGATIVE
Specific Gravity, Urine: >=1.03 — AB (ref 1.000–1.030)
Total Protein, Urine: NEGATIVE
Urine Glucose: NEGATIVE
Urobilinogen, UA: 0.2 (ref 0.0–1.0)
pH: 5.5 (ref 5.0–8.0)

## 2015-02-08 LAB — LIPID PANEL
CHOL/HDL RATIO: 4
CHOLESTEROL: 149 mg/dL (ref 0–200)
HDL: 42.3 mg/dL (ref >39.00)
LDL CALC: 95 mg/dL (ref 0–99)
NonHDL: 106.9
TRIGLYCERIDES: 62 mg/dL (ref 0.0–149.0)
VLDL: 12.4 mg/dL (ref 0.0–40.0)

## 2015-02-08 LAB — BASIC METABOLIC PANEL
BUN: 16 mg/dL (ref 6–23)
CALCIUM: 9.7 mg/dL (ref 8.4–10.5)
CO2: 26 mEq/L (ref 19–32)
CREATININE: 0.95 mg/dL (ref 0.40–1.50)
Chloride: 107 mEq/L (ref 96–112)
GFR: 115.53 mL/min (ref >60.00)
GLUCOSE: 88 mg/dL (ref 70–99)
POTASSIUM: 4.2 meq/L (ref 3.5–5.1)
Sodium: 142 mEq/L (ref 135–145)

## 2015-02-08 LAB — HEPATIC FUNCTION PANEL
ALT: 18 U/L (ref 0–53)
AST: 15 U/L (ref 0–37)
Albumin: 4.3 g/dL (ref 3.5–5.2)
Alkaline Phosphatase: 54 U/L (ref 39–117)
BILIRUBIN DIRECT: 0.1 mg/dL (ref 0.0–0.3)
BILIRUBIN TOTAL: 0.5 mg/dL (ref 0.2–1.2)
Total Protein: 7.3 g/dL (ref 6.0–8.3)

## 2015-02-08 LAB — TSH: TSH: 5.09 u[IU]/mL — ABNORMAL HIGH (ref 0.35–4.50)

## 2015-02-08 MED ORDER — LOSARTAN POTASSIUM 50 MG PO TABS: 50.0000 mg | ORAL_TABLET | Freq: Every day | ORAL | Status: DC

## 2015-02-08 NOTE — Assessment & Plan Note (Signed)
Compr sock full thigh 20-30 mm Hg Rx

## 2015-02-08 NOTE — Assessment & Plan Note (Signed)
8/16 poss Altace related D/c Altace Start Losartan CXR

## 2015-02-08 NOTE — Progress Notes (Signed)
Subjective:  Patient ID: Lance [name], male    DOB: 26-Mar-1981  Age: 34 y.o. MRN: [id number]  CC: Annual Exam   HPI Lance [name] presents for a well exam. C/o frequent chest colds: is congested a lot. C/o cough all the time...  Outpatient Prescriptions Prior to Visit  Medication Sig Dispense Refill  . acetaminophen (TYLENOL) 500 MG tablet Take 1,000 mg by mouth daily as needed for headache.     . Fluticasone Furoate-Vilanterol (BREO ELLIPTA) 100-25 MCG/INH AEPB Inhale 1 puff into the lungs daily as needed. 28 each 0  . furosemide (LASIX) 40 MG tablet Take 1 tablet (40 mg total) by mouth daily as needed for fluid. 30 tablet 3  . metoprolol tartrate (LOPRESSOR) 25 MG tablet Take 25 mg by mouth daily.    Marland Kitchen tetrahydrozoline-zinc (VISINE-AC) 0.05-0.25 % ophthalmic solution Place 1 drop into both eyes 3 (three) times daily as needed (dry eyes).    . traMADol (ULTRAM) 50 MG tablet Take 1-2 tablets (50-100 mg total) by mouth every 12 (twelve) hours as needed for moderate pain or severe pain. 30 tablet 0  . levofloxacin (LEVAQUIN) 500 MG tablet Take 1 tablet (500 mg total) by mouth daily. 7 tablet 0  . ramipril (ALTACE) 5 MG capsule TAKE ONE CAPSULE BY MOUTH ONCE DAILY 90 capsule 1   No facility-administered medications prior to visit.    ROS Review of Systems  Constitutional: Negative for appetite change, fatigue and unexpected weight change.  HENT: Negative for congestion, nosebleeds, sneezing, sore throat and trouble swallowing.   Eyes: Negative for itching and visual disturbance.  Respiratory: Positive for cough. Negative for shortness of breath and wheezing.   Cardiovascular: Negative for chest pain, palpitations and leg swelling.  Gastrointestinal: Negative for nausea, diarrhea, blood in stool and abdominal distention.  Genitourinary: Negative for frequency and hematuria.  Musculoskeletal: Negative for back pain, joint swelling, gait problem and neck pain.  Skin: Negative for  rash.  Neurological: Negative for dizziness, tremors, speech difficulty and weakness.  Psychiatric/Behavioral: Negative for suicidal ideas, sleep disturbance, dysphoric mood and agitation. The patient is not nervous/anxious.     Objective:  BP 102/70 mmHg  Pulse 75  Ht  (1.88 m)  Wt 297 lb (134.718 kg)  BMI 38.12 kg/m2  SpO2 97%  BP Readings from Last 3 Encounters:  02/08/15 102/70  01/04/15 110/78  09/17/14 110/76    Wt Readings from Last 3 Encounters:  02/08/15 297 lb (134.718 kg)  01/04/15 284 lb 6.4 oz (129.003 kg)  09/17/14 295 lb (133.811 kg)    Physical Exam  Constitutional: He is oriented to person, place, and time. He appears well-developed. No distress.  NAD  HENT:  Mouth/Throat: Oropharynx is clear and moist.  Eyes: Conjunctivae are normal. Pupils are equal, round, and reactive to light.  Neck: Normal range of motion. No JVD present. No thyromegaly present.  Cardiovascular: Normal rate, regular rhythm, normal heart sounds and intact distal pulses.  Exam reveals no gallop and no friction rub.   No murmur heard. Pulmonary/Chest: Effort normal and breath sounds normal. No respiratory distress. He has no wheezes. He has no rales. He exhibits no tenderness.  Abdominal: Soft. Bowel sounds are normal. He exhibits no distension and no mass. There is no tenderness. There is no rebound and no guarding.  Musculoskeletal: Normal range of motion. He exhibits no edema or tenderness.  Lymphadenopathy:    He has no cervical adenopathy.  Neurological: He is alert and oriented to  person, place, and time. He has normal reflexes. No cranial nerve deficit. He exhibits normal muscle tone. He displays a negative Romberg sign. Coordination and gait normal.  Skin: Skin is warm and dry. No rash noted.  Psychiatric: He has a normal mood and affect. His behavior is normal. Judgment and thought content normal.    Lab Results  Component Value Date   WBC 8.8 02/08/2015   HGB 15.6  02/08/2015   HCT 46.2 02/08/2015   PLT 250.0 02/08/2015   GLUCOSE 88 02/08/2015   CHOL 149 02/08/2015   TRIG 62.0 02/08/2015   HDL 42.30 02/08/2015   LDLCALC 95 02/08/2015   ALT 18 02/08/2015   AST 15 02/08/2015   NA 142 02/08/2015   K 4.2 02/08/2015   CL 107 02/08/2015   CREATININE 0.95 02/08/2015   BUN 16 02/08/2015   CO2 26 02/08/2015   TSH 5.09* 02/08/2015   PSA 0.45 02/01/2013   INR 1.46 08/22/2012    No results found.  Assessment & Plan:   Lance [name] was seen today for annual exam.  Diagnoses and all orders for this visit:  Well adult exam -     Basic metabolic panel; Future -     CBC with Differential/Platelet; Future -     Hepatic function panel; Future -     Lipid panel; Future -     TSH; Future -     Urinalysis; Future  Cough -     Basic metabolic panel; Future -     CBC with Differential/Platelet; Future -     Hepatic function panel; Future -     Lipid panel; Future -     TSH; Future -     Urinalysis; Future -     DG Chest 2 View  Chronic venous insufficiency  Need for influenza vaccination -     Flu Vaccine QUAD 36+ mos IM  Abnormal TSH -     T4, free; Future -     TSH; Future  Other orders -     losartan (COZAAR) 50 MG tablet; Take 1 tablet (50 mg total) by mouth daily.  I have discontinued [name] ramipril and levofloxacin. I am also having him start on losartan. Additionally, I am having him maintain his tetrahydrozoline-zinc, acetaminophen, metoprolol tartrate, furosemide, traMADol, Fluticasone Furoate-Vilanterol, and AVENOVA.  Meds ordered this encounter  Medications  . Eyelid Cleansers (AVENOVA) 0.01 % SOLN    Sig: 3 (three) times a week.    Refill:  1  . losartan (COZAAR) 50 MG tablet    Sig: Take 1 tablet (50 mg total) by mouth daily.    Dispense:  30 tablet    Refill:  11     Follow-up: Return in about 1 year (around 02/08/2016) for Wellness Exam.  Sonda Primes, MD

## 2015-02-08 NOTE — Assessment & Plan Note (Signed)
We discussed age appropriate health related issues, including available/recomended screening tests and vaccinations. We discussed a need for adhering to healthy diet and exercise. Labs/EKG were reviewed/ordered. All questions were answered.  Wt loss discussed 

## 2015-02-08 NOTE — Progress Notes (Signed)
Pre visit review using our clinic review tool, if applicable. No additional management support is needed unless otherwise documented below in the visit note. 

## 2015-02-08 NOTE — Assessment & Plan Note (Signed)
FT4, TSH in 1 mo

## 2015-04-19 ENCOUNTER — Other Ambulatory Visit: Payer: Self-pay | Admitting: Internal Medicine

## 2015-05-26 ENCOUNTER — Other Ambulatory Visit: Payer: Self-pay | Admitting: Internal Medicine

## 2015-09-05 IMAGING — CR DG CHEST 2V
2 series · 2 of 2 positions shown · non-contrast
Comparison: Portable chest x-ray of 08/16/2012 and two-view chest
x-ray of 08/13/2012

CLINICAL DATA: Congestion, cough for 1 month, former smoking
history

EXAM:
CHEST  2 VIEW

[view not recorded (1 of 2)]
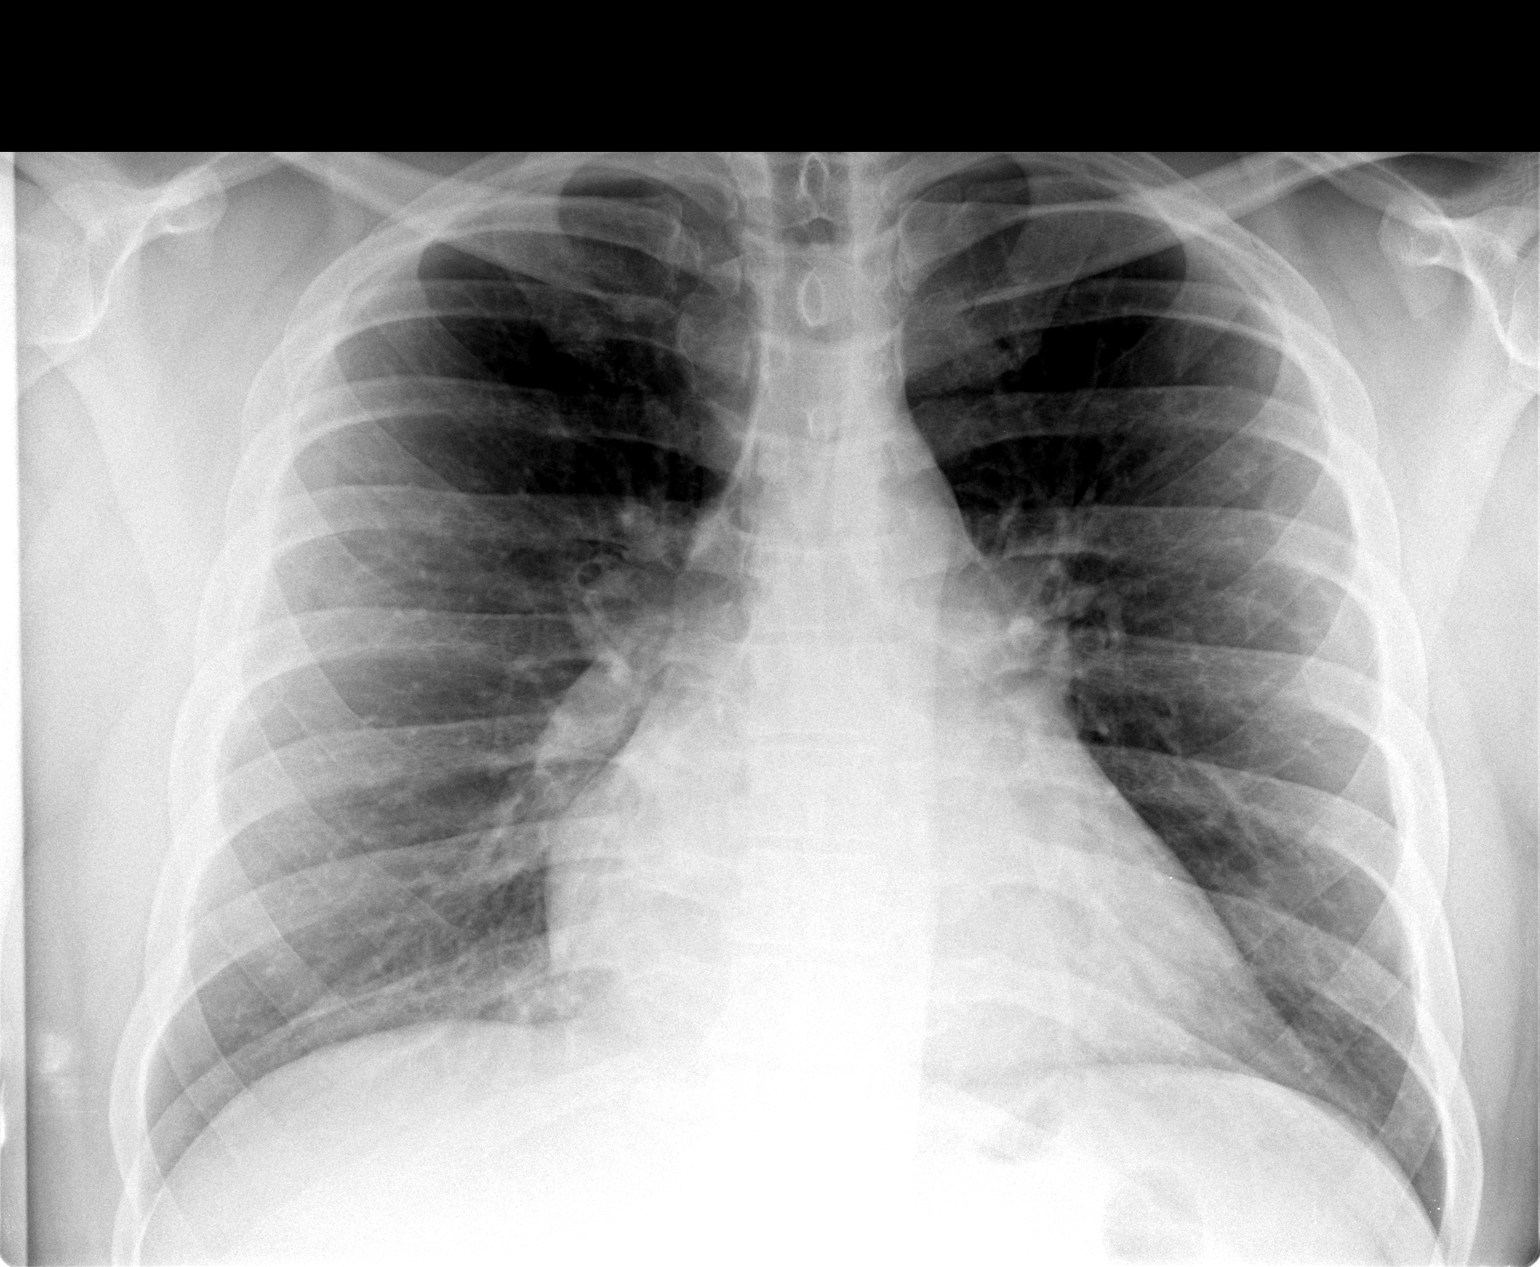

[view not recorded (2 of 2)]
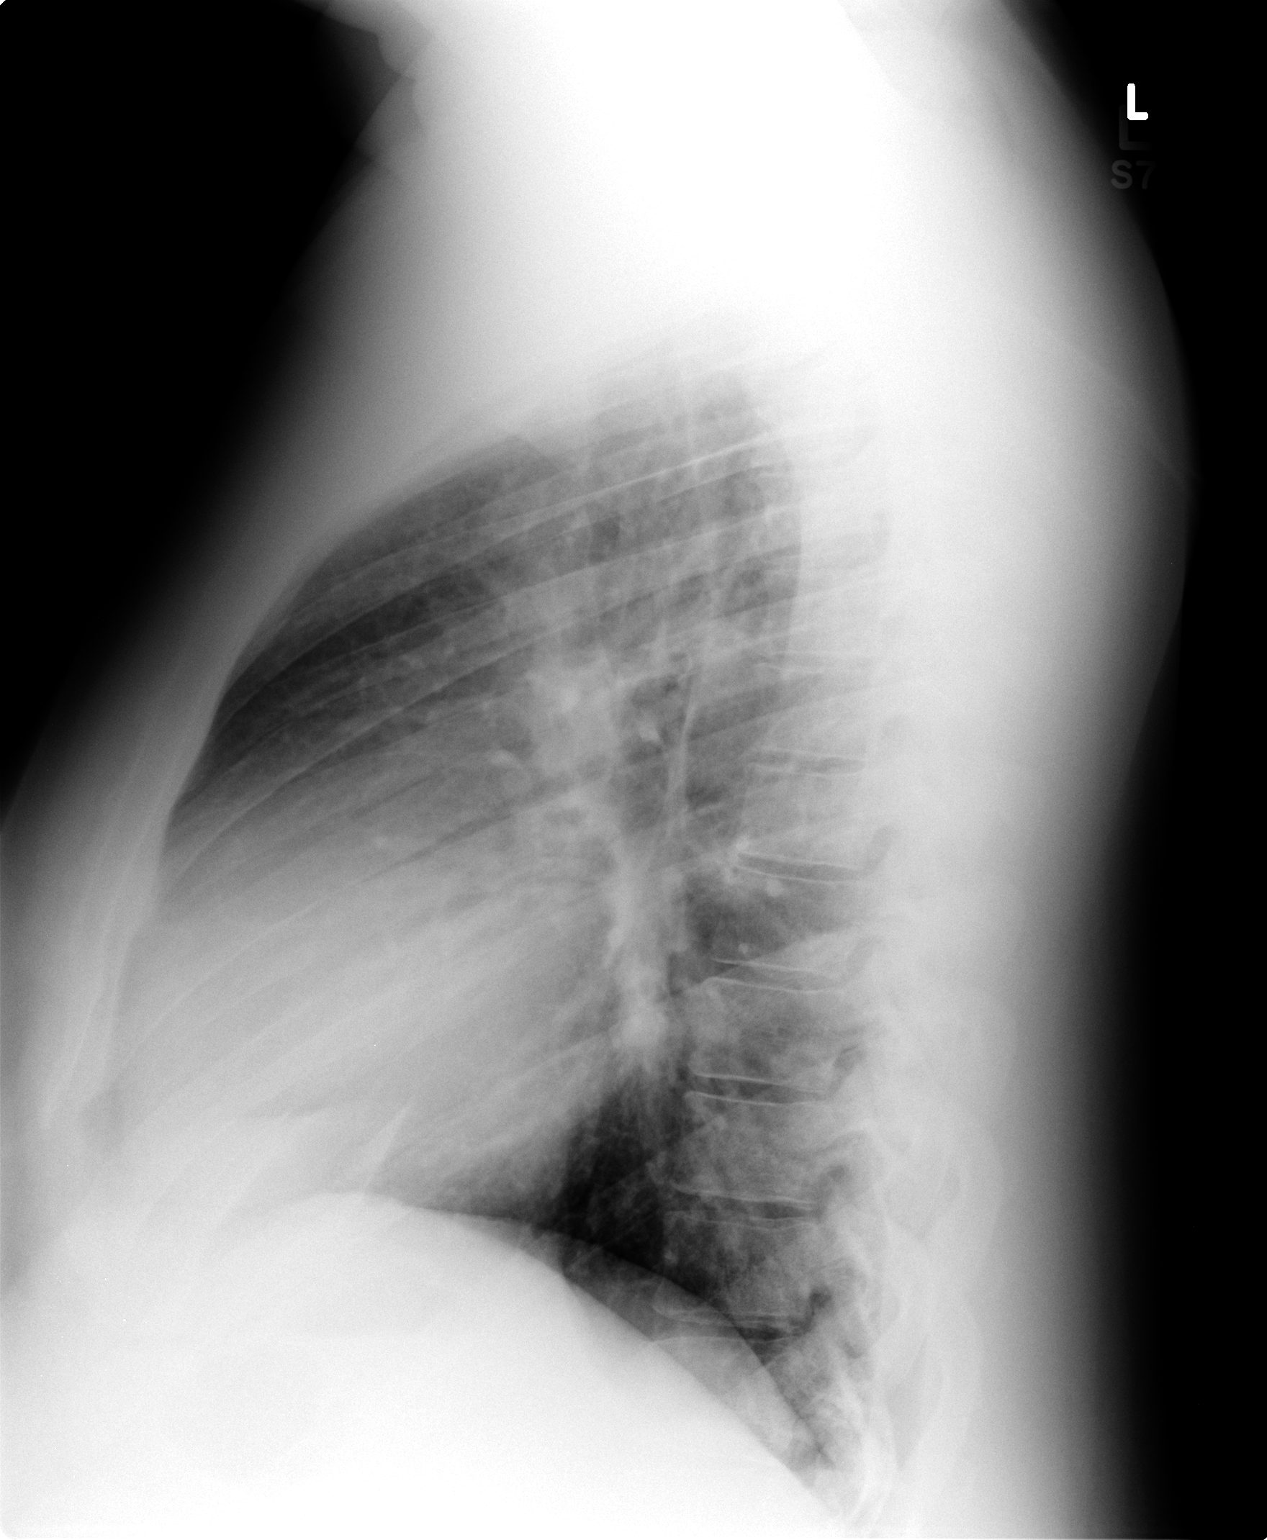

[2 of 2 positions shown; findings below may reference images not displayed]

FINDINGS: No active infiltrate or effusion is seen. There is mild
peribronchial thickening present which may indicate bronchitis.
Mediastinal and hilar contours are unremarkable. The heart is mildly
enlarged and stable. No acute bony abnormality is seen.
IMPRESSION: No active lung disease. Stable mild cardiomegaly. Peribronchial
thickening most consistent with bronchitis.

## 2015-12-20 ENCOUNTER — Encounter: Payer: Self-pay | Admitting: Family

## 2015-12-20 ENCOUNTER — Ambulatory Visit (INDEPENDENT_AMBULATORY_CARE_PROVIDER_SITE_OTHER): Payer: BLUE CROSS/BLUE SHIELD | Admitting: Family

## 2015-12-20 ENCOUNTER — Other Ambulatory Visit (INDEPENDENT_AMBULATORY_CARE_PROVIDER_SITE_OTHER): Payer: BLUE CROSS/BLUE SHIELD

## 2015-12-20 VITALS — BP 122/78 | HR 103 | Temp 97.8°F | Resp 18 | Ht 74.0 in | Wt 311.8 lb

## 2015-12-20 DIAGNOSIS — R0602 Shortness of breath: Secondary | ICD-10-CM

## 2015-12-20 LAB — CBC
HEMATOCRIT: 43.1 % (ref 39.0–52.0)
HEMOGLOBIN: 14.8 g/dL (ref 13.0–17.0)
MCHC: 34.3 g/dL (ref 30.0–36.0)
MCV: 88.8 fl (ref 78.0–100.0)
Platelets: 219 10*3/uL (ref 150.0–400.0)
RBC: 4.86 Mil/uL (ref 4.22–5.81)
RDW: 14 % (ref 11.5–15.5)
WBC: 7.3 10*3/uL (ref 4.0–10.5)

## 2015-12-20 LAB — BRAIN NATRIURETIC PEPTIDE: Pro B Natriuretic peptide (BNP): 195 pg/mL — ABNORMAL HIGH (ref 0.0–100.0)

## 2015-12-20 MED ORDER — FUROSEMIDE 40 MG PO TABS: 40.0000 mg | ORAL_TABLET | Freq: Every day | ORAL | Status: DC | PRN

## 2015-12-20 NOTE — Progress Notes (Signed)
Subjective:    Patient ID: Lance [name], male    DOB: 1980/06/17, 35 y.o.   MRN: [id number]  Chief Complaint  Patient presents with  . Shortness of Breath    when sitting down he gets SOB and cough, x2 months but has gotten worse     HPI:  Lance [name] is a 35 y.o. male who  has a past medical history of Persistent atrial fibrillation (HCC) (2003); OSA on CPAP; Onychomycosis; Acute systolic heart failure (HCC) (02/04/5620); Atrial flutter (HCC); CHF (congestive heart failure) (HCC); Neuromuscular disorder (HCC); Medical history non-contributory; Coronary artery disease; Dysrhythmia; Peripheral vascular disease (HCC); and Shortness of breath (12/20/2015). and presents today for an office visit.   This is a new problem. Associated symptom of shortness of breath and cough especially when sitting and lying down has been going on for about 2 months and the course has continued to worsened. No modifying factors that make the symptoms better or worse. Denies fevers. Able to complete activities of daily living and riding his bike without difficulty. Symptoms generally occur when seated or lying down. Denies chest pain. Denies symptoms of reflux or burning in his chest. Endorses he may have had some bronchitis a couple of months ago. He does not always wear his CPAP on a regular basis.   Allergies  Allergen Reactions  . Amiodarone Other (See Comments)    IV intolerance with Severe hypotension, shock after amiodarone bolus.  Taking po Amiodarone without problems.  . Altace [Ramipril]     Cough   . Azithromycin Itching  . Protonix [Pantoprazole Sodium]     Couldn't belch  . Fish Allergy Other (See Comments)    Not shell fish - regular fish causes tingling  . Metoprolol Succinate Other (See Comments)    Body aches at high doses.  Currently takes two tablets of lopressor  twice daily; three tablets twice daily caused him to ache.     No current outpatient prescriptions on file prior to  visit.   No current facility-administered medications on file prior to visit.     Past Surgical History  Procedure Laterality Date  . Mandible surgery      underbite correction  . Cardioversion N/A 08/14/2012    Procedure: CARDIOVERSION;  Surgeon: Dolores Patty, MD;  Location: [facility] OR;  Service: Cardiovascular;  Laterality: N/A;  . Atrial flutter ablation  09/14/2012    by Dr [name]  . Tee without cardioversion N/A 02/14/2014    Procedure: TRANSESOPHAGEAL ECHOCARDIOGRAM (TEE);  Surgeon: Quintella Reichert, MD;  Location: [facility] ENDOSCOPY;  Service: Cardiovascular;  Laterality: N/A;  . Cardiac catheterization    . Atrial flutter ablation N/A 09/14/2012    Procedure: ATRIAL FLUTTER ABLATION;  Surgeon: Marinus Maw, MD;  Location: [facility] CATH LAB;  Service: Cardiovascular;  Laterality: N/A;  . Atrial fibrillation ablation N/A 02/15/2014    Procedure: ATRIAL FIBRILLATION ABLATION;  Surgeon: Gardiner Rhyme, MD;  Location: [facility] CATH LAB;  Service: Cardiovascular;  Laterality: N/A;     Review of Systems  Constitutional: Negative for fever and chills.  Respiratory: Positive for cough and shortness of breath. Negative for chest tightness.   Cardiovascular: Negative for chest pain and palpitations.      Objective:    BP 122/78 mmHg  Pulse 103  Temp(Src) 97.8 F (36.6 C) (Oral)  Resp 18  Ht  (1.88 m)  Wt 311 lb 12.8 oz (141.432 kg)  BMI 40.02 kg/m2  SpO2 97% Nursing note  and vital signs reviewed.  Physical Exam  Constitutional: He is oriented to person, place, and time. He appears well-developed and well-nourished. No distress.  Cardiovascular: Normal heart sounds and intact distal pulses.  An irregular rhythm present. Tachycardia present.   Pulmonary/Chest: Effort normal. He has rales.  Neurological: He is alert and oriented to person, place, and time.  Skin: Skin is warm and dry.  Psychiatric: He has a normal mood and affect. His behavior is normal. Judgment and thought content normal.        Assessment & Plan:   Problem List Items Addressed This Visit      Other   Shortness of breath - Primary    Shortness of breath with questionable origin although symptoms are generally consistent with heart failure given mild crackles in a couple of lung fields. There is no lower extremity edema. Cannot rule out resolving infectious process although unlikely. Obtain BNP and CBC. Start Lasix x 1 week and then as needed. Follow up pending blood work.       Relevant Medications   furosemide (LASIX) 40 MG tablet   Other Relevant Orders   CBC   B Nat Peptide      I have discontinued Mr. Gogue tetrahydrozoline-zinc, acetaminophen, metoprolol tartrate, furosemide, traMADol, fluticasone furoate-vilanterol, AVENOVA, losartan, and metoprolol tartrate. I am also having him start on furosemide.   Meds ordered this encounter  Medications  . furosemide (LASIX) 40 MG tablet    Sig: Take 1 tablet (40 mg total) by mouth daily as needed.    Dispense:  30 tablet    Refill:  0    Order Specific Question:  Supervising Provider    Answer:  Hillard Danker A [[id number]]     Follow-up: Return if symptoms worsen or fail to improve.  Jeanine Luz, FNP

## 2015-12-20 NOTE — Assessment & Plan Note (Signed)
Shortness of breath with questionable origin although symptoms are generally consistent with heart failure given mild crackles in a couple of lung fields. There is no lower extremity edema. Cannot rule out resolving infectious process although unlikely. Obtain BNP and CBC. Start Lasix x 1 week and then as needed. Follow up pending blood work.

## 2015-12-20 NOTE — Patient Instructions (Signed)
Thank you for choosing [facility].  Summary/Instructions:  Please take the furosemide for the next 1 week and then reduce to as needed.   Use your CPAP as prescribed.  Your prescription(s) have been submitted to your pharmacy or been printed and provided for you. Please take as directed and contact our office if you believe you are having problem(s) with the medication(s) or have any questions.  Please stop by the lab on the lower level of the building for your blood work. Your results will be released to MyChart (or called to you) after review, usually within 72 hours after test completion. If any changes need to be made, you will be notified at that same time.  1. The lab is open from 7:30am to 5:30 pm Monday-Friday  2. No appointment is necessary  3. Fasting (if needed) is 6-8 hours after food and drink; black coffee  and water are okay   If your symptoms worsen or fail to improve, please contact our office for further instruction, or in case of emergency go directly to the emergency room at the closest medical facility.

## 2015-12-25 ENCOUNTER — Encounter: Payer: Self-pay | Admitting: Family

## 2015-12-25 DIAGNOSIS — R0602 Shortness of breath: Secondary | ICD-10-CM

## 2015-12-31 ENCOUNTER — Ambulatory Visit (INDEPENDENT_AMBULATORY_CARE_PROVIDER_SITE_OTHER)
Admission: RE | Admit: 2015-12-31 | Discharge: 2015-12-31 | Disposition: A | Discharge status: Home / Self Care | Payer: BLUE CROSS/BLUE SHIELD | Source: Ambulatory Visit | Attending: Family | Admitting: Family

## 2015-12-31 ENCOUNTER — Encounter: Payer: Self-pay | Admitting: Family

## 2015-12-31 DIAGNOSIS — R0602 Shortness of breath: Secondary | ICD-10-CM | POA: Diagnosis not present

## 2015-12-31 DIAGNOSIS — R05 Cough: Secondary | ICD-10-CM | POA: Diagnosis not present

## 2016-01-01 ENCOUNTER — Telehealth: Payer: Self-pay | Admitting: Internal Medicine

## 2016-01-01 ENCOUNTER — Ambulatory Visit (INDEPENDENT_AMBULATORY_CARE_PROVIDER_SITE_OTHER): Payer: BLUE CROSS/BLUE SHIELD | Admitting: Nurse Practitioner

## 2016-01-01 ENCOUNTER — Other Ambulatory Visit: Payer: Self-pay | Admitting: Nurse Practitioner

## 2016-01-01 ENCOUNTER — Inpatient Hospital Stay (HOSPITAL_COMMUNITY)
Admission: AD | Admit: 2016-01-01 | Discharge: 2016-01-06 | DRG: 308 | Disposition: A | Discharge status: Home / Self Care | Payer: BLUE CROSS/BLUE SHIELD | Source: Ambulatory Visit | Attending: Cardiovascular Disease | Admitting: Cardiovascular Disease

## 2016-01-01 ENCOUNTER — Encounter: Payer: Self-pay | Admitting: Nurse Practitioner

## 2016-01-01 VITALS — BP 100/70 | HR 154 | Ht 74.0 in | Wt 309.8 lb

## 2016-01-01 DIAGNOSIS — I739 Peripheral vascular disease, unspecified: Secondary | ICD-10-CM | POA: Diagnosis present

## 2016-01-01 DIAGNOSIS — T447X5A Adverse effect of beta-adrenoreceptor antagonists, initial encounter: Secondary | ICD-10-CM | POA: Diagnosis present

## 2016-01-01 DIAGNOSIS — I2510 Atherosclerotic heart disease of native coronary artery without angina pectoris: Secondary | ICD-10-CM | POA: Diagnosis present

## 2016-01-01 DIAGNOSIS — I5023 Acute on chronic systolic (congestive) heart failure: Secondary | ICD-10-CM | POA: Diagnosis not present

## 2016-01-01 DIAGNOSIS — I480 Paroxysmal atrial fibrillation: Secondary | ICD-10-CM | POA: Diagnosis present

## 2016-01-01 DIAGNOSIS — R079 Chest pain, unspecified: Secondary | ICD-10-CM | POA: Diagnosis present

## 2016-01-01 DIAGNOSIS — I272 Other secondary pulmonary hypertension: Secondary | ICD-10-CM | POA: Diagnosis present

## 2016-01-01 DIAGNOSIS — E662 Morbid (severe) obesity with alveolar hypoventilation: Secondary | ICD-10-CM | POA: Diagnosis not present

## 2016-01-01 DIAGNOSIS — I5041 Acute combined systolic (congestive) and diastolic (congestive) heart failure: Secondary | ICD-10-CM | POA: Diagnosis not present

## 2016-01-01 DIAGNOSIS — Z6839 Body mass index (BMI) 39.0-39.9, adult: Secondary | ICD-10-CM | POA: Diagnosis not present

## 2016-01-01 DIAGNOSIS — Z87891 Personal history of nicotine dependence: Secondary | ICD-10-CM | POA: Diagnosis not present

## 2016-01-01 DIAGNOSIS — I481 Persistent atrial fibrillation: Principal | ICD-10-CM | POA: Diagnosis present

## 2016-01-01 DIAGNOSIS — I340 Nonrheumatic mitral (valve) insufficiency: Secondary | ICD-10-CM | POA: Diagnosis not present

## 2016-01-01 DIAGNOSIS — I4892 Unspecified atrial flutter: Secondary | ICD-10-CM | POA: Diagnosis present

## 2016-01-01 DIAGNOSIS — I110 Hypertensive heart disease with heart failure: Secondary | ICD-10-CM | POA: Diagnosis present

## 2016-01-01 DIAGNOSIS — I429 Cardiomyopathy, unspecified: Secondary | ICD-10-CM | POA: Diagnosis not present

## 2016-01-01 DIAGNOSIS — I952 Hypotension due to drugs: Secondary | ICD-10-CM | POA: Diagnosis not present

## 2016-01-01 DIAGNOSIS — I428 Other cardiomyopathies: Secondary | ICD-10-CM

## 2016-01-01 DIAGNOSIS — Z9114 Patient's other noncompliance with medication regimen: Secondary | ICD-10-CM

## 2016-01-01 DIAGNOSIS — I4891 Unspecified atrial fibrillation: Secondary | ICD-10-CM | POA: Diagnosis not present

## 2016-01-01 DIAGNOSIS — G4733 Obstructive sleep apnea (adult) (pediatric): Secondary | ICD-10-CM | POA: Diagnosis not present

## 2016-01-01 DIAGNOSIS — I959 Hypotension, unspecified: Secondary | ICD-10-CM | POA: Diagnosis present

## 2016-01-01 LAB — CBC WITH DIFFERENTIAL/PLATELET
Basophils Absolute: 0 10*3/uL (ref 0.0–0.1)
Basophils Relative: 0 %
Eosinophils Absolute: 0.2 10*3/uL (ref 0.0–0.7)
Eosinophils Relative: 2 %
HCT: 44.3 % (ref 39.0–52.0)
Hemoglobin: 15 g/dL (ref 13.0–17.0)
Lymphocytes Relative: 29 %
Lymphs Abs: 2.4 10*3/uL (ref 0.7–4.0)
MCH: 30.2 pg (ref 26.0–34.0)
MCHC: 33.9 g/dL (ref 30.0–36.0)
MCV: 89.1 fL (ref 78.0–100.0)
Monocytes Absolute: 0.7 10*3/uL (ref 0.1–1.0)
Monocytes Relative: 8 %
Neutro Abs: 4.9 10*3/uL (ref 1.7–7.7)
Neutrophils Relative %: 61 %
Platelets: 228 10*3/uL (ref 150–400)
RBC: 4.97 MIL/uL (ref 4.22–5.81)
RDW: 13.2 % (ref 11.5–15.5)
WBC: 8.2 10*3/uL (ref 4.0–10.5)

## 2016-01-01 LAB — COMPREHENSIVE METABOLIC PANEL
ALT: 31 U/L (ref 17–63)
AST: 30 U/L (ref 15–41)
Albumin: 4.2 g/dL (ref 3.5–5.0)
Alkaline Phosphatase: 51 U/L (ref 38–126)
Anion gap: 7 (ref 5–15)
BUN: 12 mg/dL (ref 6–20)
CO2: 25 mmol/L (ref 22–32)
Calcium: 9.3 mg/dL (ref 8.9–10.3)
Chloride: 109 mmol/L (ref 101–111)
Creatinine, Ser: 1.08 mg/dL (ref 0.61–1.24)
GFR calc Af Amer: >60 mL/min (ref >60)
GFR calc non Af Amer: >60 mL/min (ref >60)
Glucose, Bld: 90 mg/dL (ref 65–99)
Potassium: 3.8 mmol/L (ref 3.5–5.1)
Sodium: 141 mmol/L (ref 135–145)
Total Bilirubin: 1.2 mg/dL (ref 0.3–1.2)
Total Protein: 7.1 g/dL (ref 6.5–8.1)

## 2016-01-01 LAB — PROTIME-INR
INR: 1.07
Prothrombin Time: 13.9 seconds (ref 11.4–15.2)

## 2016-01-01 LAB — TSH: TSH: 1.999 u[IU]/mL (ref 0.350–4.500)

## 2016-01-01 LAB — MAGNESIUM: Magnesium: 1.9 mg/dL (ref 1.7–2.4)

## 2016-01-01 LAB — TROPONIN I
Troponin I: 0.03 ng/mL (ref <0.03)
Troponin I: 0.04 ng/mL (ref <0.03)

## 2016-01-01 LAB — APTT: aPTT: 29 seconds (ref 24–36)

## 2016-01-01 LAB — BRAIN NATRIURETIC PEPTIDE: B Natriuretic Peptide: 302.2 pg/mL — ABNORMAL HIGH (ref 0.0–100.0)

## 2016-01-01 MED ORDER — ACETAMINOPHEN 325 MG PO TABS
650.0000 mg | ORAL_TABLET | ORAL | Status: DC | PRN
  Administered 2016-01-01 – 2016-01-03 (×2): 650 mg via ORAL
  Filled 2016-01-01 (×2): qty 2

## 2016-01-01 MED ORDER — DILTIAZEM HCL-DEXTROSE 100-5 MG/100ML-% IV SOLN (PREMIX)
5.0000 mg/h | INTRAVENOUS | Status: DC
  Administered 2016-01-01: 5 mg/h via INTRAVENOUS
  Administered 2016-01-01: 15 mg/h via INTRAVENOUS
  Administered 2016-01-02 (×2): 5 mg/h via INTRAVENOUS
  Administered 2016-01-02: 10 mg/h via INTRAVENOUS
  Filled 2016-01-01 (×3): qty 100

## 2016-01-01 MED ORDER — ONDANSETRON HCL 4 MG/2ML IJ SOLN: 4.0000 mg | Freq: Four times a day (QID) | INTRAMUSCULAR | Status: DC | PRN

## 2016-01-01 MED ORDER — ASPIRIN EC 81 MG PO TBEC
81.0000 mg | DELAYED_RELEASE_TABLET | Freq: Every day | ORAL | Status: DC
  Administered 2016-01-01 – 2016-01-06 (×6): 81 mg via ORAL
  Filled 2016-01-01 (×6): qty 1

## 2016-01-01 MED ORDER — GI COCKTAIL ~~LOC~~
30.0000 mL | Freq: Three times a day (TID) | ORAL | Status: DC | PRN
  Administered 2016-01-01: 30 mL via ORAL
  Filled 2016-01-01 (×3): qty 30

## 2016-01-01 MED ORDER — RIVAROXABAN 20 MG PO TABS
20.0000 mg | ORAL_TABLET | Freq: Every day | ORAL | Status: DC
  Administered 2016-01-01 – 2016-01-06 (×6): 20 mg via ORAL
  Filled 2016-01-01 (×6): qty 1

## 2016-01-01 MED ORDER — FUROSEMIDE 10 MG/ML IJ SOLN
60.0000 mg | Freq: Two times a day (BID) | INTRAMUSCULAR | Status: DC
  Administered 2016-01-01 – 2016-01-04 (×6): 60 mg via INTRAVENOUS
  Filled 2016-01-01 (×6): qty 6

## 2016-01-01 MED ORDER — FAMOTIDINE IN NACL 20-0.9 MG/50ML-% IV SOLN
20.0000 mg | Freq: Two times a day (BID) | INTRAVENOUS | Status: DC
  Filled 2016-01-01 (×2): qty 50

## 2016-01-01 MED ORDER — ACETAMINOPHEN 325 MG PO TABS: 650.0000 mg | ORAL_TABLET | ORAL | Status: DC | PRN

## 2016-01-01 MED ORDER — METOPROLOL TARTRATE 12.5 MG HALF TABLET
12.5000 mg | ORAL_TABLET | Freq: Two times a day (BID) | ORAL | Status: DC
  Administered 2016-01-01 – 2016-01-02 (×3): 12.5 mg via ORAL
  Filled 2016-01-01 (×3): qty 1

## 2016-01-01 MED ORDER — DILTIAZEM LOAD VIA INFUSION
20.0000 mg | Freq: Once | INTRAVENOUS | Status: AC
  Administered 2016-01-01: 20 mg via INTRAVENOUS
  Filled 2016-01-01: qty 20

## 2016-01-01 MED ORDER — ONDANSETRON HCL 4 MG/2ML IJ SOLN
4.0000 mg | Freq: Four times a day (QID) | INTRAMUSCULAR | Status: DC | PRN
  Administered 2016-01-01: 4 mg via INTRAVENOUS
  Filled 2016-01-01: qty 2

## 2016-01-01 MED ORDER — POTASSIUM CHLORIDE CRYS ER 20 MEQ PO TBCR
20.0000 meq | EXTENDED_RELEASE_TABLET | Freq: Two times a day (BID) | ORAL | Status: DC
  Administered 2016-01-01 – 2016-01-06 (×11): 20 meq via ORAL
  Filled 2016-01-01 (×10): qty 1

## 2016-01-01 NOTE — Patient Instructions (Signed)
We are admitting you to the hospital today. 

## 2016-01-01 NOTE — Discharge Instructions (Addendum)
You have an appointment set up with the Atrial Fibrillation Clinic.  Multiple studies have shown that being followed by a dedicated atrial fibrillation clinic in addition to the standard care you receive from your other physicians improves health. We believe that enrollment in the atrial fibrillation clinic will allow [place] to better care for you.   The phone number to the Atrial Fibrillation Clinic is [phone]. The clinic is staffed Monday through Friday from 8:30am to 5pm.  Parking Directions: The clinic is located in the Heart and Vascular Building connected to [facility]. 1)From [address] turn on to [address] and go to the 3rd entrance  (Heart and Vascular entrance) on the right. 2)Look to the right for Heart &Vascular Parking Garage. 3)A code for the entrance is required please call the clinic to receive this.   4)Take the elevators to the 1st floor. Registration is in the room with the glass walls at the end of the hallway.  If you have any trouble parking or locating the clinic, please dont hesitate to call [phone].    Information on my medicine - XARELTO (Rivaroxaban)  This medication education was reviewed with me or my healthcare representative as part of my discharge preparation.  The pharmacist that spoke with me during my hospital stay was:  Dennie Fetters, [facility]  Why was Xarelto prescribed for you? Xarelto was prescribed for you to reduce the risk of a blood clot forming that can cause a stroke if you have a medical condition called atrial fibrillation (a type of irregular heartbeat).  What do you need to know about xarelto ? Take your Xarelto ONCE DAILY at the same time every day with your evening meal. If you have difficulty swallowing the tablet whole, you may crush it and mix in applesauce just prior to taking your dose.  Take Xarelto exactly as prescribed by your doctor and DO NOT stop taking Xarelto without talking to the doctor who  prescribed the medication.  Stopping without other stroke prevention medication to take the place of Xarelto may increase your risk of developing a clot that causes a stroke.  Refill your prescription before you run out.  After discharge, you should have regular check-up appointments with your healthcare provider that is prescribing your Xarelto.  In the future your dose may need to be changed if your kidney function or weight changes by a significant amount.  What do you do if you miss a dose? If you are taking Xarelto ONCE DAILY and you miss a dose, take it as soon as you remember on the same day then continue your regularly scheduled once daily regimen the next day. Do not take two doses of Xarelto at the same time or on the same day.   Important Safety Information A possible side effect of Xarelto is bleeding. You should call your healthcare provider right away if you experience any of the following: ? Bleeding from an injury or your nose that does not stop. ? Unusual colored urine (red or dark brown) or unusual colored stools (red or black). ? Unusual bruising for unknown reasons. ? A serious fall or if you hit your head (even if there is no bleeding).  Some medicines may interact with Xarelto and might increase your risk of bleeding while on Xarelto. To help avoid this, consult your healthcare provider or pharmacist prior to using any new prescription or non-prescription medications, including herbals, vitamins, non-steroidal anti-inflammatory drugs (NSAIDs) and supplements.  This website has more  information on Xarelto: VisitDestination.com.br.

## 2016-01-01 NOTE — H&P (Signed)
Lance [name]  01/01/2016 11:30 AM  Office Visit  MRN:  [id number]  Description: Male DOB: Oct 05, 1980 Provider: Rosalio Macadamia, NP Department: [address] [facility]  Vitals   BP  100/70     Pulse    154     Ht  6\' 2"  (1.88 m)     Wt    309 lb 12.8 oz (140.5 kg)     SpO2  99%      BMI  39.78 kg/m      Vitals History  Progress Notes   Rosalio Macadamia, NP at 01/01/2016 11:30 AM   Status: Signed  Expand All Collapse All       CARDIOLOGY OFFICE NOTE  Date:  01/01/2016    [name] Date of Birth: 04/14/1981 Medical Record #[id number]  PCP:  Sonda Primes, MD                  Cardiologist:  [name]        Chief Complaint  Patient presents with  . Edema  . Atrial Fibrillation  . Cardiomyopathy    Work in visit - seen for Dr. [name]    History of Present Illness: Lance [name] is a 35 y.o. male who presents today for a work in visit. He is seen for Dr. [name].   He has had a h/o persistent afib and prior tachy mediated cardiomyopathy. He has had prior ablation back in 2015. Previously on Xarelto and amiodarone.    Comes in today. Here alone. Has noted a cough over the past few months. Started getting short of breath since vacation over 4th of July. His weight is up. No real swelling however. Some sharp chest pain with coughing. Some slight yellow sputum. Some dizziness yesterday noted but no passing out. Does endorse a feeling of palpitations - but seems different from prior spells of AF. Not really clear how long this has been going on. Seen PCP earlier this month - CXR with severe CM. Given low dose Lasix and CBC checked. HR was elevated then and noted to be irregular. He has previously not been on any medicines. He has tolerated Xarelto in the past without issue.       Past Medical History:  Diagnosis Date  . Acute systolic heart failure (HCC) 09/14/2012  . Atrial flutter [facility])    s/p ablation by Dr [name]  . CHF (congestive heart  failure) (HCC)   . Coronary artery disease   . Dysrhythmia   . Medical history non-contributory   . Neuromuscular disorder (HCC)   . Onychomycosis   . OSA on CPAP   . Peripheral vascular disease (HCC)   . Persistent atrial fibrillation (HCC) 2003  . Shortness of breath 12/20/2015         Past Surgical History:  Procedure Laterality Date  . ATRIAL FIBRILLATION ABLATION N/A 02/15/2014   Procedure: ATRIAL FIBRILLATION ABLATION;  Surgeon: Gardiner Rhyme, MD;  [facility]: [facility] CATH LAB;  Service: Cardiovascular;  Laterality: N/A;  . ATRIAL FLUTTER ABLATION  09/14/2012   by Dr [name]  . ATRIAL FLUTTER ABLATION N/A 09/14/2012   Procedure: ATRIAL FLUTTER ABLATION;  Surgeon: Marinus Maw, MD;  Location: [facility] CATH LAB;  Service: Cardiovascular;  Laterality: N/A;  . CARDIAC CATHETERIZATION    . CARDIOVERSION N/A 08/14/2012   Procedure: CARDIOVERSION;  Surgeon: Dolores Patty, MD;  Location: [facility] OR;  Service: Cardiovascular;  Laterality: N/A;  . MANDIBLE SURGERY     underbite correction  .  TEE WITHOUT CARDIOVERSION N/A 02/14/2014   Procedure: TRANSESOPHAGEAL ECHOCARDIOGRAM (TEE);  Surgeon: Quintella Reichert, MD;  Location: [facility] ENDOSCOPY;  Service: Cardiovascular;  Laterality: N/A;     Medications:       Current Outpatient Prescriptions  Medication Sig Dispense Refill  . fexofenadine (ALLEGRA) 30 MG tablet Take 30 mg by mouth daily.    . furosemide (LASIX) 40 MG tablet Take 1 tablet (40 mg total) by mouth daily as needed. 30 tablet 0   No current facility-administered medications for this visit.     Allergies:      Allergies  Allergen Reactions  . Amiodarone Other (See Comments)    IV intolerance with Severe hypotension, shock after amiodarone bolus.  Taking po Amiodarone without problems.  . Altace [Ramipril]     Cough   . Azithromycin Itching  . Protonix [Pantoprazole Sodium]     Couldn't belch  . Fish Allergy Other (See Comments)    Not shell fish -  regular fish causes tingling  . Metoprolol Succinate Other (See Comments)    Body aches at high doses.  Currently takes two tablets of lopressor 25mg  twice daily; three tablets twice daily caused him to ache.    Social History: The patient  reports that he quit smoking about 6 years ago. His smoking use included Cigarettes and Cigars. He quit after 3.00 years of use. He has never used smokeless tobacco. He reports that he drinks alcohol. He reports that he uses drugs, including Marijuana.   Family History: The patient's family history includes Alcohol abuse in his father; Depression in his father; Diabetes in his mother; Hypertension in his mother; Osteoarthritis in his father; Varicose Veins in his mother.   Review of Systems: Please see the history of present illness.   Otherwise, the review of systems is positive for none.   All other systems are reviewed and negative.   Physical Exam: VS:  BP 100/70   Pulse (!) 154   Ht 6\' 2"  (1.88 m)   Wt (!) 309 lb 12.8 oz (140.5 kg)   SpO2 99% Comment: at rest  BMI 39.78 kg/m  .  BMI Body mass index is 39.78 kg/m.     Wt Readings from Last 3 Encounters:  01/01/16 (!) 309 lb 12.8 oz (140.5 kg)  12/20/15 (!) 311 lb 12.8 oz (141.4 kg)  02/08/15 297 lb (134.7 kg)    General: Pleasant. Morbidly obese black male who is coughing but in no acute distress.  HEENT: Normal.  Neck: Thick Cardiac: Irregular irregular rhythm. Rate is quite fast. About 160 by my exam.  No edema. His legs are full.  Respiratory:  Lungs are fairly clear to auscultation bilaterally with normal work of breathing.  GI: Obese. Soft and nontender.  MS: No deformity or atrophy. Gait and ROM intact.  Skin: Warm and dry. Color is normal.  Neuro:  Strength and sensation are intact and no gross focal deficits noted.  Psych: Alert, appropriate and with normal affect.   LABORATORY DATA:  EKG:  EKG is ordered today. This demonstrates AF with RVR - reviewed with Dr.  Clifton James (DOD).  RecentLabs       Lab Results  Component Value Date   WBC 7.3 12/20/2015   HGB 14.8 12/20/2015   HCT 43.1 12/20/2015   PLT 219.0 12/20/2015   GLUCOSE 88 02/08/2015   CHOL 149 02/08/2015   TRIG 62.0 02/08/2015   HDL 42.30 02/08/2015   LDLCALC 95 02/08/2015   ALT 18 02/08/2015  AST 15 02/08/2015   NA 142 02/08/2015   K 4.2 02/08/2015   CL 107 02/08/2015   CREATININE 0.95 02/08/2015   BUN 16 02/08/2015   CO2 26 02/08/2015   TSH 5.09 (H) 02/08/2015   PSA 0.45 02/01/2013   INR 1.46 08/22/2012      BNP (last 3 results) RecentLabs(withinlast365days)  No results for input(s): BNP in the last 8760 hours.    ProBNP (last 3 results)  RecentLabs(withinlast365days)   Recent Labs  12/20/15 1036  PROBNP 195.0*       Other Studies Reviewed Today:  CXR IMPRESSION: 1. Severe cardiomegaly. No interim change. 2. No acute infiltrate. Electronically Signed By: Maisie Fus Register On: 12/31/2015 15:15    Echo Study Conclusions from 02/2014  - Left ventricle: Systolic function was normal. The estimated ejection fraction was in the range of 50% to 55%. Wall motion was normal; there were no regional wall motion abnormalities. - Mitral valve: There was trivial regurgitation. - Left atrium: The atrium was mildly dilated. No evidence of thrombus in the atrial cavity or appendage. - Right atrium: The atrium was mildly dilated. No evidence of thrombus in the atrial cavity or appendage. - Tricuspid valve: There was mild regurgitation. - Pulmonic valve: No evidence of vegetation. There was trivial regurgitation.   Echo Study Conclusions from 2014  - Left ventricle: LVEF is approximately 10% with diffuse hypokinesis. The cavity size was moderately dilated. Wall thickness was normal. - Mitral valve: Mild regurgitation. - Right ventricle: Systolic function was severely reduced. - Pulmonary  arteries: PA peak pressure: 49mm Hg (S).  Assessment/Plan: 1. Recurrent AF with RVR and suspect associated tachycardia mediated CM - he has had this before - EF has been as low as 10% in the past. Needs admission for initiation of anticoagulation, IV Dilt for rate control and IV lasix. Will need echo updated. May need EP to see again as well. Seen with Dr. Clifton James who is in agreement as well. Will place him back on Xarelto.  Admitted to 3E.   2. Prior NICM - his last EF did show improvement. Hopefully we can get this turned back around again.   3. Morbid obesity  4. OSA  Current medicines are reviewed with the patient today.  The patient does not have concerns regarding medicines other than what has been noted above.  The following changes have been made:  See above.  Labs/ tests ordered today include:   No orders of the defined types were placed in this encounter.    Disposition:   Further disposition pending. Patient will be admitted to [facility].   Patient is agreeable to this plan and will call if any problems develop in the interim.   Signed: Rosalio Macadamia, RN, ANP-C 01/01/2016 12:08 PM  [facility] [address] [place], [place]  [address] Phone: [phone] Fax: [phone]           Referring Provider   Tresa Garter, MD      Diagnoses    Codes Comments  Atrial fibrillation with RVR [facility]) - Primary ICD-9-CM: 427.31 ICD-10-CM: I48.91        Reason for Visit   Edema   Atrial Fibrillation   Cardiomyopathy Work in visit - seen for Dr. [name]  Reason for Visit History    Level of Service   Level of Service  LOS - NO CHARGE [NC1]   Follow-up and Disposition   Routing History  Follow-up and Disposition  History    All Charges for This Encounter   Code Description Service Date Service Provider Modifiers Qty  NC1 LOS - NO CHARGE 01/01/2016 Rosalio Macadamia, NP  1  Routing History    From: Rosalio Macadamia, NP On: 01/01/2016 12:13 PM  To: Kathleene Hazel, MD, Marinus Maw, MD, Hillis Range, MD  Priority: Routine  AVS Reports   No AVS Snapshots are available for this encounter.  Patient Instructions   We are admitting you to the hospital today.      Routing History   Recipient Method Sent by Date Sent  Tresa Garter, MD In Basket Rosalio Macadamia, [place] 01/01/2016     Previous Visit   Date & Time 01/01/2016 8:21 AM Department [facility] Office Encounter # [id number]  Medical Necessity Transport Form   Medical Necessity    I have personally seen and examined this patient with Norma Fredrickson, NP. I agree with the assessment and plan as outlined above. He has known PAF. Now with recurrent atrial fibrillation with RVR and volume overload. My exam shows obese AAM in NAD, Irreg tachy, lungs clear, + edema. Vitals reviewed. Will admit to [facility] on tele unit. IV Lasix for diuresis. IV Cardizem for rate control. Repeat echo. EP consult tomorrow.   Verne Carrow 01/01/2016 1:14 PM

## 2016-01-01 NOTE — Telephone Encounter (Signed)
Calling stating he has been SOB with non productive cough since last week. Talking with him he was coughing constantly.  Saw PCP, Dr. Carver Fila on 12/20/15 who ordered CXR and started him on Lasix 40 mg, and told him to contact his cardiologist.  States his HR is fast and irregular but doesn't feel like he is in AFib.  Only medication he is taking is Lasix 40 mg.  States he hasn't noticed any difference with cough or SOB since being on Lasix. Spoke w/Dr. Johney Frame who did his ablation in 2015 who states he needed to be seen today. Norma Fredrickson, NP had an open appointment at 11:30 today.  Advised pt to be here at 11:15.  He is scheduled to see Dr. [name] in CHF clinic 8/31.

## 2016-01-01 NOTE — Progress Notes (Signed)
CARDIOLOGY OFFICE NOTE  Date:  01/01/2016    Lance [name] Date of Birth: May 26, 1981 Medical Record #[id number]  PCP:  Sonda Primes, MD  Cardiologist:  [name]    Chief Complaint  Patient presents with  . Edema  . Atrial Fibrillation  . Cardiomyopathy    Work in visit - seen for Dr. [name]    History of Present Illness: Lance [name] is a 35 y.o. male who presents today for a work in visit. He is seen for Dr. [name].   He has had a h/o persistent afib and prior tachy mediated cardiomyopathy. He has had prior ablation back in 2015. Previously on Xarelto and amiodarone.    Comes in today. Here alone. Has noted a cough over the past few months. Started getting short of breath since vacation over 4th of July. His weight is up. No real swelling however. Some sharp chest pain with coughing. Some slight yellow sputum. Some dizziness yesterday noted but no passing out. Does endorse a feeling of palpitations - but seems different from prior spells of AF. Not really clear how long this has been going on. Seen PCP earlier this month - CXR with severe CM. Given low dose Lasix and CBC checked. HR was elevated then and noted to be irregular. He has previously not been on any medicines.   Past Medical History:  Diagnosis Date  . Acute systolic heart failure (HCC) 09/14/2012  . Atrial flutter [facility])    s/p ablation by Dr [name]  . CHF (congestive heart failure) (HCC)   . Coronary artery disease   . Dysrhythmia   . Medical history non-contributory   . Neuromuscular disorder (HCC)   . Onychomycosis   . OSA on CPAP   . Peripheral vascular disease (HCC)   . Persistent atrial fibrillation (HCC) 2003  . Shortness of breath 12/20/2015    Past Surgical History:  Procedure Laterality Date  . ATRIAL FIBRILLATION ABLATION N/A 02/15/2014   Procedure: ATRIAL FIBRILLATION ABLATION;  Surgeon: Gardiner Rhyme, MD;  Location: [facility] CATH LAB;  Service: Cardiovascular;  Laterality: N/A;  . ATRIAL  FLUTTER ABLATION  09/14/2012   by Dr [name]  . ATRIAL FLUTTER ABLATION N/A 09/14/2012   Procedure: ATRIAL FLUTTER ABLATION;  Surgeon: Marinus Maw, MD;  Location: [facility] CATH LAB;  Service: Cardiovascular;  Laterality: N/A;  . CARDIAC CATHETERIZATION    . CARDIOVERSION N/A 08/14/2012   Procedure: CARDIOVERSION;  Surgeon: Dolores Patty, MD;  Location: [facility] OR;  Service: Cardiovascular;  Laterality: N/A;  . MANDIBLE SURGERY     underbite correction  . TEE WITHOUT CARDIOVERSION N/A 02/14/2014   Procedure: TRANSESOPHAGEAL ECHOCARDIOGRAM (TEE);  Surgeon: Quintella Reichert, MD;  Location: [facility] ENDOSCOPY;  Service: Cardiovascular;  Laterality: N/A;     Medications: Current Outpatient Prescriptions  Medication Sig Dispense Refill  . fexofenadine (ALLEGRA) 30 MG tablet Take 30 mg by mouth daily.    . furosemide (LASIX) 40 MG tablet Take 1 tablet (40 mg total) by mouth daily as needed. 30 tablet 0   No current facility-administered medications for this visit.     Allergies: Allergies  Allergen Reactions  . Amiodarone Other (See Comments)    IV intolerance with Severe hypotension, shock after amiodarone bolus.  Taking po Amiodarone without problems.  . Altace [Ramipril]     Cough   . Azithromycin Itching  . Protonix [Pantoprazole Sodium]     Couldn't belch  . Fish Allergy Other (See Comments)  Not shell fish - regular fish causes tingling  . Metoprolol Succinate Other (See Comments)    Body aches at high doses.  Currently takes two tablets of lopressor 25mg  twice daily; three tablets twice daily caused him to ache.    Social History: The patient  reports that he quit smoking about 6 years ago. His smoking use included Cigarettes and Cigars. He quit after 3.00 years of use. He has never used smokeless tobacco. He reports that he drinks alcohol. He reports that he uses drugs, including Marijuana.   Family History: The patient's family history includes Alcohol abuse in his father; Depression in  his father; Diabetes in his mother; Hypertension in his mother; Osteoarthritis in his father; Varicose Veins in his mother.   Review of Systems: Please see the history of present illness.   Otherwise, the review of systems is positive for none.   All other systems are reviewed and negative.   Physical Exam: VS:  BP 100/70   Pulse (!) 154   Ht 6\' 2"  (1.88 m)   Wt (!) 309 lb 12.8 oz (140.5 kg)   SpO2 99% Comment: at rest  BMI 39.78 kg/m  .  BMI Body mass index is 39.78 kg/m.  Wt Readings from Last 3 Encounters:  01/01/16 (!) 309 lb 12.8 oz (140.5 kg)  12/20/15 (!) 311 lb 12.8 oz (141.4 kg)  02/08/15 297 lb (134.7 kg)    General: Pleasant. Well developed, well nourished and in no acute distress.   HEENT: Normal.  Neck: Supple, no JVD, carotid bruits, or masses noted.  Cardiac: Irregular irregular rhythm. Rate is quite fast. About 160 by my exam.  No edema. His legs are full.  Respiratory:  Lungs are fairly clear to auscultation bilaterally with normal work of breathing.  GI: Soft and nontender.  MS: No deformity or atrophy. Gait and ROM intact.  Skin: Warm and dry. Color is normal.  Neuro:  Strength and sensation are intact and no gross focal deficits noted.  Psych: Alert, appropriate and with normal affect.   LABORATORY DATA:  EKG:  EKG is ordered today. This demonstrates AF with RVR - reviewed with Dr. Clifton James (DOD).  Lab Results  Component Value Date   WBC 7.3 12/20/2015   HGB 14.8 12/20/2015   HCT 43.1 12/20/2015   PLT 219.0 12/20/2015   GLUCOSE 88 02/08/2015   CHOL 149 02/08/2015   TRIG 62.0 02/08/2015   HDL 42.30 02/08/2015   LDLCALC 95 02/08/2015   ALT 18 02/08/2015   AST 15 02/08/2015   NA 142 02/08/2015   K 4.2 02/08/2015   CL 107 02/08/2015   CREATININE 0.95 02/08/2015   BUN 16 02/08/2015   CO2 26 02/08/2015   TSH 5.09 (H) 02/08/2015   PSA 0.45 02/01/2013   INR 1.46 08/22/2012    BNP (last 3 results) No results for input(s): BNP in the last 8760  hours.  ProBNP (last 3 results)  Recent Labs  12/20/15 1036  PROBNP 195.0*     Other Studies Reviewed Today:  CXR IMPRESSION: 1.  Severe cardiomegaly.  No interim change. 2. No acute infiltrate. Electronically Signed   By: Maisie Fus  Register   On: 12/31/2015 15:15    Echo Study Conclusions from 02/2014  - Left ventricle: Systolic function was normal. The estimated ejection fraction was in the range of 50% to 55%. Wall motion was normal; there were no regional wall motion abnormalities. - Mitral valve: There was trivial regurgitation. - Left atrium: The atrium  was mildly dilated. No evidence of thrombus in the atrial cavity or appendage. - Right atrium: The atrium was mildly dilated. No evidence of thrombus in the atrial cavity or appendage. - Tricuspid valve: There was mild regurgitation. - Pulmonic valve: No evidence of vegetation. There was trivial regurgitation.   Echo Study Conclusions from 2014  - Left ventricle: LVEF is approximately 10% with diffuse hypokinesis. The cavity size was moderately dilated. Wall thickness was normal. - Mitral valve: Mild regurgitation. - Right ventricle: Systolic function was severely reduced. - Pulmonary arteries: PA peak pressure: 49mm Hg (S).  Assessment/Plan: 1. Recurrent AF with RVR and suspect associated tachycardia mediated CM - he has had this before - EF has been as low as 10% in the past. Needs admission for IV heparin, IV Dilt for rate control and IV lasix. Will need echo updated. May need EP to see again as well. Seen with Dr. Clifton James who is in agreement as well.   2. Prior NICM - his last EF did show improvement.   3. Morbid obesity  4. OSA  Current medicines are reviewed with the patient today.  The patient does not have concerns regarding medicines other than what has been noted above.  The following changes have been made:  See above.  Labs/ tests ordered today include:   No orders of the  defined types were placed in this encounter.    Disposition:   Further disposition pending. Patient will be admitted to [facility].   Patient is agreeable to this plan and will call if any problems develop in the interim.   Signed: Rosalio Macadamia, RN, ANP-C 01/01/2016 12:08 PM  [facility] [address] [place], [place]  [address] Phone: [phone] Fax: [phone]

## 2016-01-01 NOTE — Progress Notes (Signed)
Patient with intermittent dyspnea and chest pressure that is relieved when he vomits.  Ordered furosemide 60 mg and ondansetron 4 mg given without relief. MD Paged

## 2016-01-01 NOTE — Telephone Encounter (Signed)
Pt c/o Shortness Of Breath: STAT if SOB developed within the last 24 hours or pt is noticeably SOB on the phone  1. Are you currently SOB (can you hear that pt is SOB on the phone)? YES  2. How long have you been experiencing SOB? 2 DAYS  3. Are you SOB when sitting or when up moving around? YES  4. Are you currently experiencing any other symptoms? YES   Pt c/o of Chest Pain: STAT if CP now or developed within 24 hours  1. Are you having CP right now? YES  2. Are you experiencing any other symptoms (ex. SOB, nausea, vomiting, sweating)? SOB  3. How long have you been experiencing CP? 2DAYS  4. Is your CP continuous or coming and going? CONTINUOUS  5. Have you taken Nitroglycerin? NO

## 2016-01-02 ENCOUNTER — Other Ambulatory Visit (HOSPITAL_COMMUNITY): Payer: BLUE CROSS/BLUE SHIELD

## 2016-01-02 ENCOUNTER — Observation Stay (HOSPITAL_COMMUNITY): Payer: BLUE CROSS/BLUE SHIELD

## 2016-01-02 ENCOUNTER — Encounter (HOSPITAL_COMMUNITY): Payer: Self-pay | Admitting: Nurse Practitioner

## 2016-01-02 DIAGNOSIS — T447X5A Adverse effect of beta-adrenoreceptor antagonists, initial encounter: Secondary | ICD-10-CM | POA: Diagnosis present

## 2016-01-02 DIAGNOSIS — I429 Cardiomyopathy, unspecified: Secondary | ICD-10-CM

## 2016-01-02 DIAGNOSIS — I2510 Atherosclerotic heart disease of native coronary artery without angina pectoris: Secondary | ICD-10-CM | POA: Diagnosis present

## 2016-01-02 DIAGNOSIS — R079 Chest pain, unspecified: Secondary | ICD-10-CM | POA: Diagnosis present

## 2016-01-02 DIAGNOSIS — I5023 Acute on chronic systolic (congestive) heart failure: Secondary | ICD-10-CM | POA: Diagnosis not present

## 2016-01-02 DIAGNOSIS — I110 Hypertensive heart disease with heart failure: Secondary | ICD-10-CM | POA: Diagnosis present

## 2016-01-02 DIAGNOSIS — I959 Hypotension, unspecified: Secondary | ICD-10-CM | POA: Diagnosis present

## 2016-01-02 DIAGNOSIS — I4892 Unspecified atrial flutter: Secondary | ICD-10-CM | POA: Diagnosis not present

## 2016-01-02 DIAGNOSIS — Z6839 Body mass index (BMI) 39.0-39.9, adult: Secondary | ICD-10-CM | POA: Diagnosis not present

## 2016-01-02 DIAGNOSIS — G4733 Obstructive sleep apnea (adult) (pediatric): Secondary | ICD-10-CM

## 2016-01-02 DIAGNOSIS — I481 Persistent atrial fibrillation: Secondary | ICD-10-CM | POA: Diagnosis not present

## 2016-01-02 DIAGNOSIS — I480 Paroxysmal atrial fibrillation: Secondary | ICD-10-CM | POA: Diagnosis present

## 2016-01-02 DIAGNOSIS — I340 Nonrheumatic mitral (valve) insufficiency: Secondary | ICD-10-CM | POA: Diagnosis not present

## 2016-01-02 DIAGNOSIS — I5041 Acute combined systolic (congestive) and diastolic (congestive) heart failure: Secondary | ICD-10-CM | POA: Diagnosis not present

## 2016-01-02 DIAGNOSIS — Z9114 Patient's other noncompliance with medication regimen: Secondary | ICD-10-CM | POA: Diagnosis not present

## 2016-01-02 DIAGNOSIS — I739 Peripheral vascular disease, unspecified: Secondary | ICD-10-CM | POA: Diagnosis present

## 2016-01-02 DIAGNOSIS — Z87891 Personal history of nicotine dependence: Secondary | ICD-10-CM | POA: Diagnosis not present

## 2016-01-02 DIAGNOSIS — E662 Morbid (severe) obesity with alveolar hypoventilation: Secondary | ICD-10-CM | POA: Diagnosis not present

## 2016-01-02 DIAGNOSIS — I272 Other secondary pulmonary hypertension: Secondary | ICD-10-CM | POA: Diagnosis present

## 2016-01-02 DIAGNOSIS — I952 Hypotension due to drugs: Secondary | ICD-10-CM | POA: Diagnosis present

## 2016-01-02 DIAGNOSIS — I428 Other cardiomyopathies: Secondary | ICD-10-CM

## 2016-01-02 DIAGNOSIS — I4891 Unspecified atrial fibrillation: Secondary | ICD-10-CM

## 2016-01-02 LAB — BASIC METABOLIC PANEL
Anion gap: 7 (ref 5–15)
BUN: 14 mg/dL (ref 6–20)
CO2: 26 mmol/L (ref 22–32)
Calcium: 9 mg/dL (ref 8.9–10.3)
Chloride: 105 mmol/L (ref 101–111)
Creatinine, Ser: 1.32 mg/dL — ABNORMAL HIGH (ref 0.61–1.24)
GFR calc Af Amer: >60 mL/min (ref >60)
GFR calc non Af Amer: >60 mL/min (ref >60)
Glucose, Bld: 132 mg/dL — ABNORMAL HIGH (ref 65–99)
Potassium: 4.3 mmol/L (ref 3.5–5.1)
Sodium: 138 mmol/L (ref 135–145)

## 2016-01-02 LAB — TROPONIN I: Troponin I: 0.04 ng/mL (ref <0.03)

## 2016-01-02 LAB — MRSA PCR SCREENING: MRSA by PCR: NEGATIVE

## 2016-01-02 MED ORDER — PERFLUTREN LIPID MICROSPHERE
1.0000 mL | INTRAVENOUS | Status: AC | PRN
  Filled 2016-01-02: qty 10

## 2016-01-02 MED ORDER — DIGOXIN 0.25 MG/ML IJ SOLN
0.5000 mg | INTRAMUSCULAR | Status: AC
  Administered 2016-01-02: 0.5 mg via INTRAVENOUS
  Filled 2016-01-02: qty 2

## 2016-01-02 MED ORDER — AMIODARONE HCL 200 MG PO TABS
400.0000 mg | ORAL_TABLET | Freq: Two times a day (BID) | ORAL | Status: DC
  Administered 2016-01-02 – 2016-01-06 (×9): 400 mg via ORAL
  Filled 2016-01-02 (×10): qty 2

## 2016-01-02 MED ORDER — DIGOXIN 0.25 MG/ML IJ SOLN
0.5000 mg | Freq: Once | INTRAMUSCULAR | Status: AC
  Administered 2016-01-02: 0.5 mg via INTRAVENOUS
  Filled 2016-01-02: qty 2

## 2016-01-02 MED ORDER — SODIUM CHLORIDE 0.9 % IV SOLN
INTRAVENOUS | Status: DC
  Administered 2016-01-02 – 2016-01-03 (×2): via INTRAVENOUS

## 2016-01-02 MED ORDER — FAMOTIDINE 20 MG PO TABS
20.0000 mg | ORAL_TABLET | Freq: Two times a day (BID) | ORAL | Status: DC
Start: 2016-01-02 — End: 2016-01-06
  Administered 2016-01-02 – 2016-01-06 (×9): 20 mg via ORAL
  Filled 2016-01-02 (×9): qty 1

## 2016-01-02 MED ORDER — METOPROLOL TARTRATE 5 MG/5ML IV SOLN
5.0000 mg | Freq: Four times a day (QID) | INTRAVENOUS | Status: DC | PRN
  Administered 2016-01-02: 5 mg via INTRAVENOUS
  Filled 2016-01-02: qty 5

## 2016-01-02 MED ORDER — METOPROLOL TARTRATE 25 MG PO TABS
25.0000 mg | ORAL_TABLET | Freq: Two times a day (BID) | ORAL | Status: DC
  Administered 2016-01-03 – 2016-01-04 (×3): 25 mg via ORAL
  Filled 2016-01-02 (×3): qty 1

## 2016-01-02 MED ORDER — METOPROLOL TARTRATE 5 MG/5ML IV SOLN: 5.0000 mg | Freq: Four times a day (QID) | INTRAVENOUS | Status: DC

## 2016-01-02 MED ORDER — CETYLPYRIDINIUM CHLORIDE 0.05 % MT LIQD
7.0000 mL | Freq: Two times a day (BID) | OROMUCOSAL | Status: DC
  Administered 2016-01-02 – 2016-01-06 (×5): 7 mL via OROMUCOSAL

## 2016-01-02 NOTE — Consult Note (Signed)
ELECTROPHYSIOLOGY CONSULT NOTE    Patient ID: Lance [name] MRN: [id number], DOB/AGE: July 06, 1980 35 y.o.  Admit date: 01/01/2016 Date of Consult: 01/02/2016  Primary Physician: Sonda Primes, MD Primary Electrophysiologist: [place] Lathe AF ablation Dr Johney Frame Requesting MD: Myrtis Ser   Reason for Consultation: atrial fibrillation  HPI:  Lance [name] is a 35 y.o. male with a past medical history significant for obesity, OSA with sporadic compliance with CPAP, prior atrial flutter status post ablation, persistent atrial fibrillation with prior tachycardia induced cardiomyopathy s/p PVI in 02/2014.  He was last seen by EP in 05/2014 at which time he was doing well.  He cancelled appointments after that and has not been seen by cardiology since.  He has also not been seen by pulmonary recently for his OSA.  He reports that he did fairly well until several weeks ago when he developed a cough with progressive shortness of breath with exertion.  He was seen by his PCP who obtained a CXR that showed cardiomegaly and started Lasix.  He was referred to cardiology and was seen yesterday where he was found to be in AF with RVR and admitted for further treatment. He has been diuresing and shortness of breath is improved. He ran out of his medications several months ago and states he was not able to get them refilled.  EP has been asked to evaluate for treatment options.   He was previously tried on Tikosyn but had frequent ventricular ectopy with concern for Torsades.  He tolerated amiodarone in the past.   He currently denies chest pain, orthopnea, PND, recent fevers, chills, nausea or vomiting. He is largely unaware of his atrial fibrillation.   Past Medical History:  Diagnosis Date  . Acute systolic heart failure (HCC) 09/14/2012  . Atrial flutter [facility])    s/p ablation by Dr [name]  . Medical history non-contributory   . Neuromuscular disorder (HCC)   . Onychomycosis   . OSA on CPAP   . Peripheral  vascular disease (HCC)   . Persistent atrial fibrillation (HCC) 2003     Surgical History:  Past Surgical History:  Procedure Laterality Date  . ATRIAL FIBRILLATION ABLATION N/A 02/15/2014   Procedure: ATRIAL FIBRILLATION ABLATION;  Surgeon: Gardiner Rhyme, MD;  Location: [facility] CATH LAB;  Service: Cardiovascular;  Laterality: N/A;  . ATRIAL FLUTTER ABLATION  09/14/2012   by Dr [name]  . ATRIAL FLUTTER ABLATION N/A 09/14/2012   Procedure: ATRIAL FLUTTER ABLATION;  Surgeon: Marinus Maw, MD;  Location: [facility] CATH LAB;  Service: Cardiovascular;  Laterality: N/A;  . CARDIAC CATHETERIZATION    . CARDIOVERSION N/A 08/14/2012   Procedure: CARDIOVERSION;  Surgeon: Dolores Patty, MD;  Location: [facility] OR;  Service: Cardiovascular;  Laterality: N/A;  . MANDIBLE SURGERY     underbite correction  . TEE WITHOUT CARDIOVERSION N/A 02/14/2014   Procedure: TRANSESOPHAGEAL ECHOCARDIOGRAM (TEE);  Surgeon: Quintella Reichert, MD;  Location: [facility] ENDOSCOPY;  Service: Cardiovascular;  Laterality: N/A;     Prescriptions Prior to Admission  Medication Sig Dispense Refill Last Dose  . fexofenadine (ALLEGRA) 30 MG tablet Take 30 mg by mouth daily.   Taking  . furosemide (LASIX) 40 MG tablet Take 1 tablet (40 mg total) by mouth daily as needed. 30 tablet 0 Taking    Inpatient Medications:  . aspirin EC  81 mg Oral Daily  . famotidine  20 mg Oral BID  . furosemide  60 mg Intravenous BID  . metoprolol tartrate  12.5 mg Oral  BID  . potassium chloride  20 mEq Oral BID  . rivaroxaban  20 mg Oral Q supper   . diltiazem (CARDIZEM) infusion 5 mg/hr (01/01/16 1825)     Allergies:  Allergies  Allergen Reactions  . Amiodarone Other (See Comments)    IV intolerance with Severe hypotension, shock after amiodarone bolus.  Taking po Amiodarone without problems.  . Altace [Ramipril]     Cough   . Azithromycin Itching  . Protonix [Pantoprazole Sodium]     Couldn't belch  . Fish Allergy Other (See Comments)    Not shell fish -  regular fish causes tingling  . Metoprolol Succinate Other (See Comments)    Body aches at high doses.  Currently takes two tablets of lopressor 25mg  twice daily; three tablets twice daily caused him to ache.    Social History   Social History  . Marital status: Single    Spouse name: N/A  . Number of children: N/A  . Years of education: N/A   Occupational History  . [employer]   Social History Main Topics  . Smoking status: Former Smoker    Years: 3.00    Types: Cigarettes, Cigars    Quit date: 05/12/2009  . Smokeless tobacco: Never Used     Comment: 4 cigars daily  . Alcohol use Yes     Comment: Glass of wine on weekends  . Drug use:     Types: Marijuana     Comment: occassional use   . Sexual activity: Yes   Other Topics Concern  . Not on file   Social History Narrative  . No narrative on file     Family History  Problem Relation Age of Onset  . Diabetes Mother   . Hypertension Mother   . Varicose Veins Mother   . Depression Father   . Osteoarthritis Father   . Alcohol abuse Father      Review of Systems: All other systems reviewed and are otherwise negative except as noted above.  Physical Exam: Vitals:   01/02/16 0035 01/02/16 0520 01/02/16 0602 01/02/16 0759  BP: (!) 90/52 100/72  98/63  Pulse: (!) 113 (!) 109 (!) 115 (!) 111  Resp: 20 20  18   Temp: 98.7 F (37.1 C) 97.9 F (36.6 C)  97.8 F (36.6 C)  TempSrc: Oral Oral  Oral  SpO2: 99% 100%  99%  Weight:  (!) 305 lb 12.8 oz (138.7 kg)    Height:        GEN- The patient is morbidly obese appearing, alert and oriented x 3 today.   HEENT: normocephalic, atraumatic; sclera clear, conjunctiva pink; hearing intact; oropharynx clear; neck supple  Lungs- normal work of breathing, bibasilar crackles Heart- Tachycardic irregular rate and rhythm  GI- soft, non-tender, non-distended, bowel sounds present  Extremities- no clubbing, cyanosis, or edema  MS- no significant deformity or  atrophy Skin- warm and dry, no rash or lesion Psych- euthymic mood, full affect Neuro- strength and sensation are intact  Labs:   Lab Results  Component Value Date   WBC 8.2 01/01/2016   HGB 15.0 01/01/2016   HCT 44.3 01/01/2016   MCV 89.1 01/01/2016   PLT 228 01/01/2016     Recent Labs Lab 01/01/16 1300 01/02/16 0040  NA 141 138  K 3.8 4.3  CL 109 105  CO2 25 26  BUN 12 14  CREATININE 1.08 1.32*  CALCIUM 9.3 9.0  PROT 7.1  --   BILITOT 1.2  --  ALKPHOS 51  --   ALT 31  --   AST 30  --   GLUCOSE 90 132*       Radiology/Studies: Dg Chest 2 View Result Date: 12/31/2015 CLINICAL DATA:  Cough.  Shortness of breath. EXAM: CHEST  2 VIEW COMPARISON:  02/08/2015.  08/13/2012.  11/19/2010 . FINDINGS: Mediastinum and hilar structures normal. Cardiomegaly with normal pulmonary vascularity. No focal infiltrate. No pleural effusion or pneumothorax. Chest is stable from multiple prior exams. IMPRESSION: 1.  Severe cardiomegaly.  No interim change. 2. No acute infiltrate. Electronically Signed   By: Maisie Fus  Register   On: 12/31/2015 15:15   GUY:[id number] fibrillation, ventricular rate 154  TELEMETRY: atrial fibrillation, ventricular rates 100-120  Assessment/Plan: 1.  Persistent atrial fibrillation The patient has recurrent persistent atrial fibrillation with associated worsening HF symptoms.   Restoration of SR will be important, but his size and other co-morbidities make this challenging For now, would recommend up-titrating rate control as able, restart Amiodarone Continue Xarelto for CHADS2VASC of 1 with plans to restore SR He will require significant lifestyle modification for long-term success Will refer to AF nutrition program  Will plan for TEE/DCCV tomorrow afternoon (after 5 half-lives of Xarelto). Orders entered  2.  Morbid Obesity Weight loss encouraged  3.  OSA Compliance with CPAP encouraged He asks if there are procedural options to help with his OSA - will  refer to Dr Lazarus Salines for options  4.  Previous tachycardia induced cardiomyopathy  I am concerned that his EF is again reduced Update echo   Signed, Gypsy Balsam, NP 01/02/2016 9:33 AM   I have seen, examined the patient, and reviewed the above assessment and plan.  On exam, iRRR.  Changes to above are made where necessary.   S/p prior ablation but without appropriate compliance with lifestyle modification and follow-up.  I worry that our ability to maintain sinus long term without weight loss, sleep apnea management, etc will be low.  We discussed compliance at length today. Repeat 2D echo to evaluate for structural changes. He is appropriately initiated on xarelto and will have received 5 half lives of xarelto by tomorrow afternoon (half life is 5-9 hours).  Would therefore advise tee guided cardioversion tomorrow.  Initiate amiodarone for now and wean in the office.  Will refer to Dr Lazarus Salines after discharge for additional OSA treatment options.  Currently, I am not excited about repeat ablation. General cardiology to follow and EP to see as needed while here. Follow-up in the AF clinic in 3-5 days post discharge.  Pt is quite complex with medical and ablation refractory afib.  A high level of decision making was required for this encounter.  Co Sign: Hillis Range, MD 01/02/2016 11:35 AM

## 2016-01-02 NOTE — Progress Notes (Signed)
Patient transferred to 2 south accompanied by RN and NT via bed. Report given prior to transfer.

## 2016-01-02 NOTE — Progress Notes (Signed)
Patient Name: Lance [name] Date of Encounter: 01/02/2016  Active Problems:   Atrial fibrillation with rapid ventricular response [facility])   Primary Cardiologist: Dr. [name], Dr. Clifton James Patient Profile: Lance [name] is a 35 year old male with a past medical history of persistent Afib, and prior tachy mediated cardiomyopathy. He presented to the office on 01/01/16 with SOB, found to be in Afib/RVR with rate of 138. He was admitted and started on IV diltiazem and IV diuresis.   SUBJECTIVE: Feels ok, still has some SOB. Denies chest pain, endorses palpitations.   OBJECTIVE Vitals:   01/02/16 0035 01/02/16 0520 01/02/16 0602 01/02/16 0759  BP: (!) 90/52 100/72  98/63  Pulse: (!) 113 (!) 109 (!) 115 (!) 111  Resp: Temp: 98.7 F (37.1 C) 97.9 F (36.6 C)  97.8 F (36.6 C)  TempSrc: Oral Oral  Oral  SpO2: 99% 100%  99%  Weight:  (!) 305 lb 12.8 oz (138.7 kg)    Height:        Intake/Output Summary (Last 24 hours) at 01/02/16 0805 Last data filed at 01/02/16 0200  Gross per 24 hour  Intake              960 ml  Output              325 ml  Net              635 ml   Filed Weights   01/01/16 1245 01/02/16 0520  Weight: (!) 309 lb 1.4 oz (140.2 kg) (!) 305 lb 12.8 oz (138.7 kg)    PHYSICAL EXAM General: Well developed, well nourished,obese male in no acute distress. Head: Normocephalic, atraumatic.  Neck: Supple without bruits, JVD. Lungs:  Resp regular and unlabored, CTA. Heart: IRRR, S1, S2, no S3, S4, or murmur; no rub. Abdomen: Soft, non-tender, non-distended, BS + x 4.  Extremities: No clubbing, cyanosis, edema.  Neuro: Alert and oriented X 3. Moves all extremities spontaneously. Psych: Normal affect.  LABS: CBC: Recent Labs  01/01/16 1300  WBC 8.2  NEUTROABS 4.9  HGB 15.0  HCT 44.3  MCV 89.1  PLT 228   INR: Recent Labs  01/01/16 1300  INR 1.07   Basic Metabolic Panel: Recent Labs  01/01/16 1300 01/02/16 0040  NA 141 138  K 3.8 4.3  CL  109 105  CO2 25 26  GLUCOSE 90 132*  BUN 12 14  CREATININE 1.08 1.32*  CALCIUM 9.3 9.0  MG 1.9  --    Liver Function Tests: Recent Labs  01/01/16 1300  AST 30  ALT 31  ALKPHOS 51  BILITOT 1.2  PROT 7.1  ALBUMIN 4.2   Cardiac Enzymes: Recent Labs  01/01/16 1300 01/01/16 1852 01/02/16 0040  TROPONINI 0.03* 0.04* 0.04*   BNP:  B Natriuretic Peptide  Date/Time Value Ref Range Status  01/01/2016 01:00 PM 302.2 (H) 0.0 - 100.0 pg/mL Final    Thyroid Function Tests: Recent Labs  01/01/16 1300  TSH 1.999     Current Facility-Administered Medications:  .  acetaminophen (TYLENOL) tablet 650 mg, 650 mg, Oral, Q4H PRN, Rosalio Macadamia, NP, 650 mg at 01/01/16 2150 .  aspirin EC tablet 81 mg, 81 mg, Oral, Daily, Rosalio Macadamia, NP, 81 mg at 01/01/16 1420 .  diltiazem (CARDIZEM) 100 mg in dextrose 5% (1 mg/mL) infusion, 5-15 mg/hr, Intravenous, Titrated, Rosalio Macadamia, NP, Last Rate: 5 mL/hr at 01/01/16 1825, 5 mg/hr  at 01/01/16 1825 .  famotidine (PEPCID) tablet 20 mg, 20 mg, Oral, BID, Kathleene Hazel, MD .  furosemide (LASIX) injection 60 mg, 60 mg, Intravenous, BID, Rosalio Macadamia, NP, 60 mg at 01/01/16 1602 .  gi cocktail (Maalox,Lidocaine,Donnatal), 30 mL, Oral, TID PRN, Abelino Derrick, PA-C, 30 mL at 01/01/16 1839 .  metoprolol tartrate (LOPRESSOR) tablet 12.5 mg, 12.5 mg, Oral, BID, Rosalio Macadamia, NP, 12.5 mg at 01/01/16 2131 .  ondansetron (ZOFRAN) injection 4 mg, 4 mg, Intravenous, Q6H PRN, Rosalio Macadamia, NP, 4 mg at 01/01/16 1652 .  potassium chloride SA (K-DUR,KLOR-CON) CR tablet 20 mEq, 20 mEq, Oral, BID, Rosalio Macadamia, NP, 20 mEq at 01/01/16 2200 .  rivaroxaban (XARELTO) tablet 20 mg, 20 mg, Oral, Q supper, Rosalio Macadamia, NP, 20 mg at 01/01/16 2150 . diltiazem (CARDIZEM) infusion 5 mg/hr (01/01/16 1825)   TELE:   Afib, rates > 100     ECG: Afib RVR  Radiology/Studies: Dg Chest 2 View  Result Date: 12/31/2015 CLINICAL DATA:  Cough.   Shortness of breath. EXAM: CHEST  2 VIEW COMPARISON:  02/08/2015.  08/13/2012.  11/19/2010 . FINDINGS: Mediastinum and hilar structures normal. Cardiomegaly with normal pulmonary vascularity. No focal infiltrate. No pleural effusion or pneumothorax. Chest is stable from multiple prior exams. IMPRESSION: 1.  Severe cardiomegaly.  No interim change. 2. No acute infiltrate. Electronically Signed   By: Maisie Fus  Register   On: 12/31/2015 15:15    Current Medications:  . aspirin EC  81 mg Oral Daily  . famotidine  20 mg Oral BID  . furosemide  60 mg Intravenous BID  . metoprolol tartrate  12.5 mg Oral BID  . potassium chloride  20 mEq Oral BID  . rivaroxaban  20 mg Oral Q supper   . diltiazem (CARDIZEM) infusion 5 mg/hr (01/01/16 1825)    ASSESSMENT AND PLAN: Active Problems:   Atrial fibrillation with rapid ventricular response (HCC)  1. Atrial fibrillation with RVR: Remains in Afib, rates >100. His diltiazem is at 5mg /hr, but he tells me he feels nauseous and vomits when dose is increased any higher than 5mg . His BP is soft, last was 98/63. He has previously been on Amiodarone po. He can tolerate po Amio but tells me that IV Amio caused him severe hypotension. Per Dr.McAlhany's note yesterday, will get EP to see. He is s/p ablation in 02/2014. His po Amiodarone and Xarelto were discontinued post ablation per Dr. Johney Frame (office note 05/28/2014).   This patients CHA2DS2-VASc Score and unadjusted Ischemic Stroke Rate (% per year) is equal to 2.2 % stroke rate/year from a score of 2 Above score calculated as 1 point each if present [CHF, HTN, DM, Vascular=MI/PAD/Aortic Plaque, Age if 65-74, or Male], 2 points each if present [Age > 75, or Stroke/TIA/TE]  He was started on Xarelto yesterday, had previously been on Xarelto pre-ablation and tolerated well.   2. Non ischemic cardiomyopathy: Echo pending this admission. Previous EF was 50-55%. His EF has been as low as 10% in the past. He is not on ACE  or ARB at home.   3. Volume overload: Exacerbated by Afib. He is getting IV diuresis, continue same for today. His renal function slightly more elevated today. Will monitor.    Signed, Little Ishikawa , NP 8:05 AM 01/02/2016 Pager [phone] Patient seen and examined. I agree with the assessment and plan as detailed above. See also my additional thoughts below.   The patient is beginning to  diurese today. It is recorded above that the patient is very specific about dosing of medicines that he can tolerate. No further changes will be made this morning. Electrophysiology team has been consulted.  Willa Rough, MD, [facility] 01/02/2016 9:02 AM

## 2016-01-02 NOTE — Progress Notes (Signed)
Patient complaining of Shortness of breath and tightness in the chest. Heart rate 126-150's  Candy Sledge, NP notified .Metoprolol 5 mg IV given as ordered and increased Diltiazem from 5 to 10 mg. After few minutes patient stated he feels that he wants to passed out . He stated " I feel cold and hot" . B/P 82/50 manually.Charge nurse and  MD notified.

## 2016-01-02 NOTE — Progress Notes (Addendum)
    Subjective:  Called to see patient for persistent hypotension and rapid atrial fibrillation. On my evaluation he is complaining of shortness of breath, but feeling a little better. No chest pain at present.  Objective:  Vital Signs in the last 24 hours: Temp:  [97.7 F (36.5 C)-98.7 F (37.1 C)] 98.1 F (36.7 C) (07/27 1640) Pulse Rate:  [84-140] 117 (07/27 1647) Resp:  [18-20] 18 (07/27 1220) BP: (77-129)/(50-84) 82/50 (07/27 1701) SpO2:  [93 %-100 %] 96 % (07/27 1647) FiO2 (%):  [2 %] 2 % (07/27 0520) Weight:  [305 lb 12.8 oz (138.7 kg)] 305 lb 12.8 oz (138.7 kg) (07/27 0520)  Intake/Output from previous day: 07/26 0701 - 07/27 0700 In: 960 [P.O.:960] Out: 325 [Urine:325]  Physical Exam: Pt is alert and oriented, NAD HEENT: normal Neck: JVP - elevated Lungs: rales bilaterally in the bases CV: tachy and regular Abd: soft, NT, Positive BS, no hepatomegaly Ext: mild edema, distal pulses intact and equal Skin: warm/dry no rash   Lab Results:  Recent Labs  01/01/16 1300  WBC 8.2  HGB 15.0  PLT 228    Recent Labs  01/01/16 1300 01/02/16 0040  NA 141 138  K 3.8 4.3  CL 109 105  CO2 25 26  GLUCOSE 90 132*  BUN 12 14  CREATININE 1.08 1.32*    Recent Labs  01/01/16 1852 01/02/16 0040  TROPONINI 0.04* 0.04*    Tele: Atrial fibrillation HR 120's  Assessment/Plan:  Atrial fibrillation with RVR< complicated by hypotension. Notes reviewed. Plans for TEE/cardioversion tomorrow. Pt currently on IV dilt and has received IV metoprolol but BP has been as low the 70's. He has improved a little, but BP remains about 90 systolic. He is not currently having chest pain. He can't take IV amiodarone because it's caused profound hypotension in the past. Will try digoxin 0.5 mg load q 6 hours x 2. Otherwise continue current Rx's. Will tx to ICU for close monitoring. Plan discussed with patient and family members at bedside.   Time spent in care of this patient = 30  minutes  Tonny Bollman, M.D. 01/02/2016, 5:57 PM

## 2016-01-02 NOTE — Care Management Note (Signed)
Case Management Note  Patient Details  Name: Lance [name] MRN: [id number] Date of Birth: [birth year]  Subjective/Objective:      Admitted with Atrial Fibrillation              Action/Plan: Patient is independent of all of his ADL's; works at [employer]; PCP is Dr Posey Rea; has private insurance with BCBS with prescription drug coverage; pharmacy of choice is [facility]; no problems getting his medication.  CM talked to patient about "running out of his medication." Patient stated that he needed a refill of his prescription and could not get them filled until he is seen by his Cardiologist. He stated that he was going through a lot at that time, moving, stress at work and trying to lose weight before he is seen by his Cardiologist. CM talked to patient about the importance of complying with his medication - to be as serious about his health as he is about his job. He has scales at home and does not weigh himself daily. He states that he eats a heart healthy diet but does not have time to exercise. CM will continue to follow for DCP.  Expected Discharge Date:   possible 730/2017               Expected Discharge Plan:  Home/Self Care  In-House Referral:     Discharge planning Services  CM Consult    Status of Service:  In process, will continue to follow   Reola Mosher [phone] 01/02/2016, 2:40 PM

## 2016-01-03 ENCOUNTER — Inpatient Hospital Stay (HOSPITAL_COMMUNITY): Payer: BLUE CROSS/BLUE SHIELD

## 2016-01-03 ENCOUNTER — Encounter (HOSPITAL_COMMUNITY): Payer: Self-pay

## 2016-01-03 ENCOUNTER — Inpatient Hospital Stay (HOSPITAL_COMMUNITY): Payer: BLUE CROSS/BLUE SHIELD | Admitting: Certified Registered Nurse Anesthetist

## 2016-01-03 ENCOUNTER — Encounter (HOSPITAL_COMMUNITY)
Admission: AD | Disposition: A | Discharge status: Home / Self Care | Payer: Self-pay | Source: Ambulatory Visit | Attending: Cardiovascular Disease

## 2016-01-03 DIAGNOSIS — I340 Nonrheumatic mitral (valve) insufficiency: Secondary | ICD-10-CM

## 2016-01-03 DIAGNOSIS — I4891 Unspecified atrial fibrillation: Secondary | ICD-10-CM

## 2016-01-03 HISTORY — PX: CARDIOVERSION: SHX1299

## 2016-01-03 HISTORY — PX: TEE WITHOUT CARDIOVERSION: SHX5443

## 2016-01-03 LAB — ECHOCARDIOGRAM COMPLETE
E decel time: 141 msec
E/e' ratio: 6.67
FS: 11 % — AB (ref 28–44)
Height: 74 in
IVS/LV PW RATIO, ED: 1.25
LA ID, A-P, ES: 38 mm
LA diam end sys: 38 mm
LA diam index: 1.38 cm/m2
LA vol A4C: 70.4 ml
LA vol index: 35.2 mL/m2
LA vol: 96.7 mL
LV E/e' medial: 6.67
LV E/e'average: 6.67
LV PW d: 7.76 mm — AB (ref 0.6–1.1)
LV dias vol index: 127 mL/m2
LV dias vol: 348 mL — AB (ref 62–150)
LV e' LATERAL: 15 cm/s
LV sys vol index: 83 mL/m2
LV sys vol: 229 mL — AB (ref 21–61)
LVOT area: 4.15 cm2
LVOT diameter: 23 mm
Lateral S' vel: 13.9 cm/s
MV Dec: 141
MV Peak grad: 4 mmHg
MV pk E vel: 100 m/s
PISA EROA: 0.65 cm2
RV sys press: 47 mmHg
Reg peak vel: 281 cm/s
Simpson's disk: 34
Stroke v: 119 ml
TAPSE: 17.7 mm
TDI e' lateral: 15
TDI e' medial: 8.05
TR max vel: 281 cm/s
VTI: 54.6 cm
Weight: 4892.8 oz

## 2016-01-03 LAB — BASIC METABOLIC PANEL
ANION GAP: 8 (ref 5–15)
BUN: 14 mg/dL (ref 6–20)
CO2: 27 mmol/L (ref 22–32)
Calcium: 9 mg/dL (ref 8.9–10.3)
Chloride: 103 mmol/L (ref 101–111)
Creatinine, Ser: 0.95 mg/dL (ref 0.61–1.24)
GLUCOSE: 97 mg/dL (ref 65–99)
POTASSIUM: 3.8 mmol/L (ref 3.5–5.1)
Sodium: 138 mmol/L (ref 135–145)

## 2016-01-03 SURGERY — CARDIOVERSION
Anesthesia: Monitor Anesthesia Care

## 2016-01-03 MED ORDER — LIDOCAINE VISCOUS 2 % MT SOLN
OROMUCOSAL | Status: AC
  Filled 2016-01-03: qty 15

## 2016-01-03 MED ORDER — PROPOFOL 10 MG/ML IV BOLUS
INTRAVENOUS | Status: DC | PRN
  Administered 2016-01-03 (×5): 20 mg via INTRAVENOUS

## 2016-01-03 MED ORDER — PROPOFOL 500 MG/50ML IV EMUL
INTRAVENOUS | Status: DC | PRN
  Administered 2016-01-03: 40 ug/kg/min via INTRAVENOUS

## 2016-01-03 MED ORDER — LIDOCAINE VISCOUS 2 % MT SOLN
OROMUCOSAL | Status: DC | PRN
  Administered 2016-01-03: 15 mL via OROMUCOSAL

## 2016-01-03 MED ORDER — AMIODARONE HCL IN DEXTROSE 360-4.14 MG/200ML-% IV SOLN
60.0000 mg/h | INTRAVENOUS | Status: DC
  Administered 2016-01-03 – 2016-01-05 (×7): 60 mg/h via INTRAVENOUS
  Filled 2016-01-03 (×10): qty 200

## 2016-01-03 NOTE — Op Note (Signed)
Patient anesthetized with propofol by anesthesia WIth pads in Apex/base position patient cardioverted to SR with 200 J synchronized biphasic energy   Pt was in SR for about 1-2 minutes before reverting to atrial fibrillation

## 2016-01-03 NOTE — Progress Notes (Signed)
Pt underwent cardioversion.  Successful for 2 minutes then reverted to atrial fibrillation Will initiate IV amiodarone gtt.  Bolus as needed  Start 1mg  / mni for now

## 2016-01-03 NOTE — Transfer of Care (Signed)
Immediate Anesthesia Transfer of Care Note  Patient: Lance [name]  Procedure(s) Performed: Procedure(s): CARDIOVERSION (N/A) TRANSESOPHAGEAL ECHOCARDIOGRAM (TEE) (N/A)  Patient Location: PACU  Anesthesia Type:MAC  Level of Consciousness: awake, alert  and oriented  Airway & Oxygen Therapy: Patient Spontanous Breathing  Post-op Assessment: Report given to RN and Post -op Vital signs reviewed and stable  Post vital signs: Reviewed and stable  Last Vitals:  Vitals:   01/03/16 1344 01/03/16 1431  BP: 110/85 110/70  Pulse: 100 (!) 102  Resp: 17 (!) 29  Temp:  37.2 C    Last Pain:  Vitals:   01/03/16 1431  TempSrc: Oral  PainSc:          Complications: No apparent anesthesia complications

## 2016-01-03 NOTE — Op Note (Signed)
LA LAA without masses TV normal  Mild TR PV normal AV normal MV is normal  The annulus is dilated at 47 mm  There is at least moderate to severe MR NO PFO by color doppler.  With injection of agitated saline there was rare bubble seen late in LA consistent with intrapulmonary shunt LVEF is severely depressed at 255 Normal thoracic aorta.

## 2016-01-03 NOTE — Interval H&P Note (Signed)
History and Physical Interval Note:  01/03/2016 12:42 PM  Lance [name]  has presented today for surgery, with the diagnosis of a-fib  The various methods of treatment have been discussed with the patient and family. After consideration of risks, benefits and other options for treatment, the patient has consented to  Procedure(s): CARDIOVERSION (N/A) TRANSESOPHAGEAL ECHOCARDIOGRAM (TEE) (N/A) as a surgical intervention .  The patient's history has been reviewed, patient examined, no change in status, stable for surgery.  I have reviewed the patient's chart and labs.  Questions were answered to the patient's satisfaction.     Dietrich Pates

## 2016-01-03 NOTE — Progress Notes (Signed)
  Echocardiogram Echocardiogram Transesophageal has been performed.  Lance [name] 01/03/2016, 2:25 PM

## 2016-01-03 NOTE — Progress Notes (Signed)
Patient Name: Lance [name] Date of Encounter: 01/03/2016  Active Problems:   Atrial fibrillation with rapid ventricular response (HCC)   Nonischemic cardiomyopathy (HCC)   Morbid obesity due to excess calories (HCC)   Atrial fibrillation with RVR [facility])   Primary Cardiologist: Dr. Clifton James Patient Profile: Lance [name] is a 35 year old male with a past medical history of persistent Afib, and prior tachy mediated cardiomyopathy. He presented to the office on 01/01/16 with SOB, found to be in Afib/RVR with rate of 138. He was started on diltiazem gtt, continued to have rate control issues, IV metoprolol caused hypotension. Plan is for TEE/DCCV today.   SUBJECTIVE:  OBJECTIVE Vitals:   01/03/16 0530 01/03/16 0600 01/03/16 0630 01/03/16 0700  BP: 106/81 124/84 136/70 128/76  Pulse: 63  94 98  Resp: (!) 22 (!) 31 (!) 28 (!) 22  Temp:    98.6 F (37 C)  TempSrc:    Oral  SpO2: 96% 95% 94% 96%  Weight:      Height:        Intake/Output Summary (Last 24 hours) at 01/03/16 0911 Last data filed at 01/03/16 0700  Gross per 24 hour  Intake              592 ml  Output             2251 ml  Net            -1659 ml   Filed Weights   01/01/16 1245 01/02/16 0520 01/03/16 0500  Weight: (!) 309 lb 1.4 oz (140.2 kg) (!) 305 lb 12.8 oz (138.7 kg) (!) 305 lb 5.4 oz (138.5 kg)    PHYSICAL EXAM General: Well developed, well nourished,obese male in no acute distress. Head: Normocephalic, atraumatic.  Neck: Supple without bruits,hard to asses JVD. Lungs:  Resp regular and unlabored, diminished at bases Heart: RRR, S1, S2, no S3, S4, or murmur; no rub. Abdomen: Soft, non-tender, non-distended, BS + x 4.  Extremities: No clubbing, cyanosis, No edema.  Neuro: Alert and oriented X 3. Moves all extremities spontaneously. Psych: Normal affect.  LABS: CBC: Recent Labs  01/01/16 1300  WBC 8.2  NEUTROABS 4.9  HGB 15.0  HCT 44.3  MCV 89.1  PLT 228   INR: Recent Labs  01/01/16 1300    INR 1.07   Basic Metabolic Panel: Recent Labs  01/01/16 1300 01/02/16 0040 01/03/16 0342  NA 141 138 138  K 3.8 4.3 3.8  CL 109 105 103  CO2 25 26 27   GLUCOSE 90 132* 97  BUN 12 14 14   CREATININE 1.08 1.32* 0.95  CALCIUM 9.3 9.0 9.0  MG 1.9  --   --    Liver Function Tests: Recent Labs  01/01/16 1300  AST 30  ALT 31  ALKPHOS 51  BILITOT 1.2  PROT 7.1  ALBUMIN 4.2   Cardiac Enzymes: Recent Labs  01/01/16 1300 01/01/16 1852 01/02/16 0040  TROPONINI 0.03* 0.04* 0.04*   BNP:  B Natriuretic Peptide  Date/Time Value Ref Range Status  01/01/2016 01:00 PM 302.2 (H) 0.0 - 100.0 pg/mL Final   Pro B Natriuretic peptide (BNP)  Date/Time Value Ref Range Status  12/20/2015 10:36 AM 195.0 (H) 0.0 - 100.0 pg/mL Final  08/13/2012 09:29 AM 2,394.0 (H) 0 - 125 pg/mL Final   Thyroid Function Tests: Recent Labs  01/01/16 1300  TSH 1.999     Current Facility-Administered Medications:  .  0.9 %  sodium chloride infusion, ,  Intravenous, Continuous, Kathleene Hazel, MD, Last Rate: 10 mL/hr at 01/03/16 0700 .  acetaminophen (TYLENOL) tablet 650 mg, 650 mg, Oral, Q4H PRN, Rosalio Macadamia, NP, 650 mg at 01/01/16 2150 .  amiodarone (PACERONE) tablet 400 mg, 400 mg, Oral, BID, Marily Lente, NP, 400 mg at 01/03/16 0901 .  antiseptic oral rinse (CPC / CETYLPYRIDINIUM CHLORIDE 0.05%) solution 7 mL, 7 mL, Mouth Rinse, BID, Kathleene Hazel, MD, 7 mL at 01/02/16 2200 .  aspirin EC tablet 81 mg, 81 mg, Oral, Daily, Rosalio Macadamia, NP, 81 mg at 01/03/16 0901 .  diltiazem (CARDIZEM) 100 mg in dextrose 5% (1 mg/mL) infusion, 5-15 mg/hr, Intravenous, Titrated, Rosalio Macadamia, NP, Stopped at 01/02/16 1900 .  famotidine (PEPCID) tablet 20 mg, 20 mg, Oral, BID, Kathleene Hazel, MD, 20 mg at 01/03/16 0901 .  furosemide (LASIX) injection 60 mg, 60 mg, Intravenous, BID, Rosalio Macadamia, NP, 60 mg at 01/03/16 0902 .  gi cocktail (Maalox,Lidocaine,Donnatal), 30 mL,  Oral, TID PRN, Abelino Derrick, PA-C, 30 mL at 01/01/16 1839 .  metoprolol (LOPRESSOR) injection 5 mg, 5 mg, Intravenous, Q6H PRN, Little Ishikawa, NP, 5 mg at 01/02/16 1553 .  metoprolol tartrate (LOPRESSOR) tablet 25 mg, 25 mg, Oral, BID, Marily Lente, NP, 25 mg at 01/03/16 0901 .  ondansetron (ZOFRAN) injection 4 mg, 4 mg, Intravenous, Q6H PRN, Rosalio Macadamia, NP, 4 mg at 01/01/16 1652 .  potassium chloride SA (K-DUR,KLOR-CON) CR tablet 20 mEq, 20 mEq, Oral, BID, Rosalio Macadamia, NP, 20 mEq at 01/03/16 0901 .  rivaroxaban (XARELTO) tablet 20 mg, 20 mg, Oral, Q supper, Rosalio Macadamia, NP, 20 mg at 01/03/16 0900 . sodium chloride 10 mL/hr at 01/03/16 0700  . diltiazem (CARDIZEM) infusion Stopped (01/02/16 1900)    TELE:  Afib rates in 90-110's  ECG: Afib   Current Medications:  . amiodarone  400 mg Oral BID  . antiseptic oral rinse  7 mL Mouth Rinse BID  . aspirin EC  81 mg Oral Daily  . famotidine  20 mg Oral BID  . furosemide  60 mg Intravenous BID  . metoprolol tartrate  25 mg Oral BID  . potassium chloride  20 mEq Oral BID  . rivaroxaban  20 mg Oral Q supper   . sodium chloride 10 mL/hr at 01/03/16 0700  . diltiazem (CARDIZEM) infusion Stopped (01/02/16 1900)    ASSESSMENT AND PLAN: Active Problems:   Atrial fibrillation with rapid ventricular response (HCC)   Nonischemic cardiomyopathy (HCC)   Morbid obesity due to excess calories (HCC)   Atrial fibrillation with RVR (HCC)  1. Atrial fibrillation with RVR: Has had ablation in the past in 2015, was doing well and maintaining NSR until a few days ago. He was started on Xarelto first dose was 01/01/16. He was started on diltiazem gtt for rate control, rates still elevated. IV metoprolol given last evening, developed hypotension was transferred to ICU. He was loaded with IV digoxin for rate control. Stable this am. Plan is for TEE/DCCV today at 14:00.   This patients CHA2DS2-VASc Score and unadjusted Ischemic Stroke Rate (% per  year) is equal to 0.6 % stroke rate/year from a score of 1 Above score calculated as 1 point each if present [CHF, HTN, DM, Vascular=MI/PAD/Aortic Plaque, Age if 65-74, or Male], 2 points each if present [Age > 75, or Stroke/TIA/TE]   2. Non ischemic cardiomyopathy: Echo pending this admission. Previous EF was 50-55%. His EF  has been as low as 10% in the past. He is not on ACE or ARB at home.   3. Volume overload: Exacerbated by Afib.  He says he is breathing better, his cough has subsided.He is getting IV diuresis, continue same for today. Renal function stable. Will monitor.    Signed, Little Ishikawa , NP 9:11 AM 01/03/2016 Pager [phone] Patient seen and examined. I agree with the assessment and plan as detailed above. See also my additional thoughts below.   The patient is slowly diureses. He is for TEE cardioversion today. His two-dimensional echo data is pending. Hopefully he can be converted to sinus rhythm. I'm hopeful that with this his blood pressure will be more stable and diuresis can be continued. Also we will have to see what his left ventricular function is by echo. Historically he has had marked decrease in LV function when he has had atrial fibrillation with increased heart rate.  Willa Rough, MD, [facility] 01/03/2016 10:15 AM

## 2016-01-03 NOTE — Anesthesia Preprocedure Evaluation (Addendum)
Anesthesia Evaluation  Patient identified by MRN, date of birth, ID band Patient awake    Reviewed: Allergy & Precautions, H&P , NPO status , Patient's Chart, lab work & pertinent test results  Airway Mallampati: I  TM Distance: >3 FB Neck ROM: Full    Dental no notable dental hx. (+) Dental Advisory Given   Pulmonary sleep apnea and Continuous Positive Airway Pressure Ventilation , former smoker,  Sever OSA.  Better with CPAP 14cm pressure   Pulmonary exam normal        Cardiovascular Normal cardiovascular exam+ dysrhythmias Atrial Fibrillation  Rhythm:Irregular Rate:Normal  Long hx of AF.  EF has been as low as 10% when fast vent rate. Now about 50-55%, on Amiododeone   Neuro/Psych    GI/Hepatic   Endo/Other  Morbid obesity  Renal/GU      Musculoskeletal   Abdominal (+) + obese,   Peds  Hematology   Anesthesia Other Findings   Reproductive/Obstetrics                            Anesthesia Physical  Anesthesia Plan  ASA: III  Anesthesia Plan: MAC   Post-op Pain Management:    Induction: Intravenous  Airway Management Planned: Simple Face Mask  Additional Equipment:   Intra-op Plan:   Post-operative Plan:   Informed Consent: I have reviewed the patients History and Physical, chart, labs and discussed the procedure including the risks, benefits and alternatives for the proposed anesthesia with the patient or authorized representative who has indicated his/her understanding and acceptance.     Plan Discussed with:   Anesthesia Plan Comments:         Anesthesia Quick Evaluation

## 2016-01-03 NOTE — Progress Notes (Signed)
  Echocardiogram 2D Echocardiogram has been performed with definity.  Lance [name] 01/03/2016, 8:29 AM

## 2016-01-03 NOTE — OR Nursing (Signed)
While in recovery pt. Noticed tongue was bleeding slightly.  Pt. Believes it may have happened during procedure.  Dr. Tenny Craw notified.  Pt. Given ice.  Currently tongue is not bleeding. VSS, Pt. Not C/O pain.   Will continue to monitor.    Omelia Blackwater, RN

## 2016-01-03 NOTE — H&P (View-Only) (Signed)
   Patient Name: Lance [name] Date of Encounter: 01/03/2016  Active Problems:   Atrial fibrillation with rapid ventricular response (HCC)   Nonischemic cardiomyopathy (HCC)   Morbid obesity due to excess calories (HCC)   Atrial fibrillation with RVR (HCC)   Primary Cardiologist: Dr. McAlhany Patient Profile: Lance [name] is a 34 year old male with a past medical history of persistent Afib, and prior tachy mediated cardiomyopathy. He presented to the office on 01/01/16 with SOB, found to be in Afib/RVR with rate of 138. He was started on diltiazem gtt, continued to have rate control issues, IV metoprolol caused hypotension. Plan is for TEE/DCCV today.   SUBJECTIVE:  OBJECTIVE Vitals:   01/03/16 0530 01/03/16 0600 01/03/16 0630 01/03/16 0700  BP: 106/81 124/84 136/70 128/76  Pulse: 63  94 98  Resp: (!) 22 (!) 31 (!) 28 (!) 22  Temp:    98.6 F (37 C)  TempSrc:    Oral  SpO2: 96% 95% 94% 96%  Weight:      Height:        Intake/Output Summary (Last 24 hours) at 01/03/16 0911 Last data filed at 01/03/16 0700  Gross per 24 hour  Intake              592 ml  Output             2251 ml  Net            -1659 ml   Filed Weights   01/01/16 1245 01/02/16 0520 01/03/16 0500  Weight: (!) 309 lb 1.4 oz (140.2 kg) (!) 305 lb 12.8 oz (138.7 kg) (!) 305 lb 5.4 oz (138.5 kg)    PHYSICAL EXAM General: Well developed, well nourished,obese male in no acute distress. Head: Normocephalic, atraumatic.  Neck: Supple without bruits,hard to asses JVD. Lungs:  Resp regular and unlabored, diminished at bases Heart: RRR, S1, S2, no S3, S4, or murmur; no rub. Abdomen: Soft, non-tender, non-distended, BS + x 4.  Extremities: No clubbing, cyanosis, No edema.  Neuro: Alert and oriented X 3. Moves all extremities spontaneously. Psych: Normal affect.  LABS: CBC: Recent Labs  01/01/16 1300  WBC 8.2  NEUTROABS 4.9  HGB 15.0  HCT 44.3  MCV 89.1  PLT 228   INR: Recent Labs  01/01/16 1300    INR 1.07   Basic Metabolic Panel: Recent Labs  01/01/16 1300 01/02/16 0040 01/03/16 0342  NA 141 138 138  K 3.8 4.3 3.8  CL 109 105 103  CO2 25 26 27  GLUCOSE 90 132* 97  BUN 12 14 14  CREATININE 1.08 1.32* 0.95  CALCIUM 9.3 9.0 9.0  MG 1.9  --   --    Liver Function Tests: Recent Labs  01/01/16 1300  AST 30  ALT 31  ALKPHOS 51  BILITOT 1.2  PROT 7.1  ALBUMIN 4.2   Cardiac Enzymes: Recent Labs  01/01/16 1300 01/01/16 1852 01/02/16 0040  TROPONINI 0.03* 0.04* 0.04*   BNP:  B Natriuretic Peptide  Date/Time Value Ref Range Status  01/01/2016 01:00 PM 302.2 (H) 0.0 - 100.0 pg/mL Final   Pro B Natriuretic peptide (BNP)  Date/Time Value Ref Range Status  12/20/2015 10:36 AM 195.0 (H) 0.0 - 100.0 pg/mL Final  08/13/2012 09:29 AM 2,394.0 (H) 0 - 125 pg/mL Final   Thyroid Function Tests: Recent Labs  01/01/16 1300  TSH 1.999     Current Facility-Administered Medications:  .  0.9 %  sodium chloride infusion, ,   Intravenous, Continuous, Christopher D McAlhany, MD, Last Rate: 10 mL/hr at 01/03/16 0700 .  acetaminophen (TYLENOL) tablet 650 mg, 650 mg, Oral, Q4H PRN, Lori C Gerhardt, NP, 650 mg at 01/01/16 2150 .  amiodarone (PACERONE) tablet 400 mg, 400 mg, Oral, BID, Amber K Seiler, NP, 400 mg at 01/03/16 0901 .  antiseptic oral rinse (CPC / CETYLPYRIDINIUM CHLORIDE 0.05%) solution 7 mL, 7 mL, Mouth Rinse, BID, Christopher D McAlhany, MD, 7 mL at 01/02/16 2200 .  aspirin EC tablet 81 mg, 81 mg, Oral, Daily, Lori C Gerhardt, NP, 81 mg at 01/03/16 0901 .  diltiazem (CARDIZEM) 100 mg in dextrose 5% 100mL (1 mg/mL) infusion, 5-15 mg/hr, Intravenous, Titrated, Lori C Gerhardt, NP, Stopped at 01/02/16 1900 .  famotidine (PEPCID) tablet 20 mg, 20 mg, Oral, BID, Christopher D McAlhany, MD, 20 mg at 01/03/16 0901 .  furosemide (LASIX) injection 60 mg, 60 mg, Intravenous, BID, Lori C Gerhardt, NP, 60 mg at 01/03/16 0902 .  gi cocktail (Maalox,Lidocaine,Donnatal), 30 mL,  Oral, TID PRN, Luke K Kilroy, PA-C, 30 mL at 01/01/16 1839 .  metoprolol (LOPRESSOR) injection 5 mg, 5 mg, Intravenous, Q6H PRN, Erin E Smith, NP, 5 mg at 01/02/16 1553 .  metoprolol tartrate (LOPRESSOR) tablet 25 mg, 25 mg, Oral, BID, Amber K Seiler, NP, 25 mg at 01/03/16 0901 .  ondansetron (ZOFRAN) injection 4 mg, 4 mg, Intravenous, Q6H PRN, Lori C Gerhardt, NP, 4 mg at 01/01/16 1652 .  potassium chloride SA (K-DUR,KLOR-CON) CR tablet 20 mEq, 20 mEq, Oral, BID, Lori C Gerhardt, NP, 20 mEq at 01/03/16 0901 .  rivaroxaban (XARELTO) tablet 20 mg, 20 mg, Oral, Q supper, Lori C Gerhardt, NP, 20 mg at 01/03/16 0900 . sodium chloride 10 mL/hr at 01/03/16 0700  . diltiazem (CARDIZEM) infusion Stopped (01/02/16 1900)    TELE:  Afib rates in 90-110's  ECG: Afib   Current Medications:  . amiodarone  400 mg Oral BID  . antiseptic oral rinse  7 mL Mouth Rinse BID  . aspirin EC  81 mg Oral Daily  . famotidine  20 mg Oral BID  . furosemide  60 mg Intravenous BID  . metoprolol tartrate  25 mg Oral BID  . potassium chloride  20 mEq Oral BID  . rivaroxaban  20 mg Oral Q supper   . sodium chloride 10 mL/hr at 01/03/16 0700  . diltiazem (CARDIZEM) infusion Stopped (01/02/16 1900)    ASSESSMENT AND PLAN: Active Problems:   Atrial fibrillation with rapid ventricular response (HCC)   Nonischemic cardiomyopathy (HCC)   Morbid obesity due to excess calories (HCC)   Atrial fibrillation with RVR (HCC)  1. Atrial fibrillation with RVR: Has had ablation in the past in 2015, was doing well and maintaining NSR until a few days ago. He was started on Xarelto first dose was 01/01/16. He was started on diltiazem gtt for rate control, rates still elevated. IV metoprolol given last evening, developed hypotension was transferred to ICU. He was loaded with IV digoxin for rate control. Stable this am. Plan is for TEE/DCCV today at 14:00.   This patients CHA2DS2-VASc Score and unadjusted Ischemic Stroke Rate (% per  year) is equal to 0.6 % stroke rate/year from a score of 1 Above score calculated as 1 point each if present [CHF, HTN, DM, Vascular=MI/PAD/Aortic Plaque, Age if 65-74, or Male], 2 points each if present [Age > 75, or Stroke/TIA/TE]   2. Non ischemic cardiomyopathy: Echo pending this admission. Previous EF was 50-55%. His EF   has been as low as 10% in the past. He is not on ACE or ARB at home.   3. Volume overload: Exacerbated by Afib.  He says he is breathing better, his cough has subsided.He is getting IV diuresis, continue same for today. Renal function stable. Will monitor.    Signed, Erin E Smith , NP 9:11 AM 01/03/2016 Pager [phone] Patient seen and examined. I agree with the assessment and plan as detailed above. See also my additional thoughts below.   The patient is slowly diureses. He is for TEE cardioversion today. His two-dimensional echo data is pending. Hopefully he can be converted to sinus rhythm. I'm hopeful that with this his blood pressure will be more stable and diuresis can be continued. Also we will have to see what his left ventricular function is by echo. Historically he has had marked decrease in LV function when he has had atrial fibrillation with increased heart rate.  Kara Melching, MD, FACC 01/03/2016 10:15 AM   

## 2016-01-03 NOTE — Anesthesia Postprocedure Evaluation (Signed)
Anesthesia Post Note  Patient: Lance [name]  Procedure(s) Performed: Procedure(s) (LRB): CARDIOVERSION (N/A) TRANSESOPHAGEAL ECHOCARDIOGRAM (TEE) (N/A)  Patient location during evaluation: Endoscopy Anesthesia Type: MAC Level of consciousness: awake Pain management: pain level controlled Vital Signs Assessment: post-procedure vital signs reviewed and stable Respiratory status: spontaneous breathing Cardiovascular status: stable Postop Assessment: no signs of nausea or vomiting Anesthetic complications: no     Last Vitals:  Vitals:   01/03/16 1344 01/03/16 1431  BP: 110/85 110/70  Pulse: 100 (!) 102  Resp: 17 (!) 29  Temp:  37.2 C    Last Pain:  Vitals:   01/03/16 1431  TempSrc: Oral  PainSc:    Pain Goal:                 [name],[name] Ahmoni Edge

## 2016-01-04 ENCOUNTER — Encounter (HOSPITAL_COMMUNITY): Payer: Self-pay | Admitting: Internal Medicine

## 2016-01-04 DIAGNOSIS — I340 Nonrheumatic mitral (valve) insufficiency: Secondary | ICD-10-CM

## 2016-01-04 DIAGNOSIS — I272 Other secondary pulmonary hypertension: Secondary | ICD-10-CM

## 2016-01-04 DIAGNOSIS — I5023 Acute on chronic systolic (congestive) heart failure: Secondary | ICD-10-CM

## 2016-01-04 LAB — BASIC METABOLIC PANEL
ANION GAP: 9 (ref 5–15)
BUN: 14 mg/dL (ref 6–20)
CALCIUM: 8.7 mg/dL — AB (ref 8.9–10.3)
CO2: 27 mmol/L (ref 22–32)
Chloride: 102 mmol/L (ref 101–111)
Creatinine, Ser: 1.14 mg/dL (ref 0.61–1.24)
GLUCOSE: 95 mg/dL (ref 65–99)
Potassium: 3.6 mmol/L (ref 3.5–5.1)
Sodium: 138 mmol/L (ref 135–145)

## 2016-01-04 MED ORDER — SODIUM CHLORIDE 0.9 % IV SOLN
INTRAVENOUS | Status: AC
  Administered 2016-01-04: 14:00:00 via INTRAVENOUS

## 2016-01-04 MED ORDER — METOPROLOL TARTRATE 12.5 MG HALF TABLET
12.5000 mg | ORAL_TABLET | Freq: Two times a day (BID) | ORAL | Status: DC
  Administered 2016-01-04 – 2016-01-06 (×4): 12.5 mg via ORAL
  Filled 2016-01-04 (×4): qty 1

## 2016-01-04 NOTE — Progress Notes (Signed)
Pt called out feeling dizzy and was clammy to touch and pale. BP was 64/52 at 1320. Previous reading was 108/74 at 1300. Pt laid flat and 2 L  applied. MD paged to make aware, Ordered NS bolus and wanted to continue amio drip. Will cont to monitor and assess

## 2016-01-04 NOTE — Progress Notes (Signed)
Patient Name: Lance [name] Date of Encounter: 01/04/2016  Active Problems:   Atrial fibrillation with rapid ventricular response (HCC)   Nonischemic cardiomyopathy (HCC)   Morbid obesity due to excess calories (HCC)   Atrial fibrillation with RVR (HCC)   Length of Stay: 2  SUBJECTIVE  Remains in AF with mediocre rate control (110s at rest) and relatively low BP. Excellent diuresis.  CURRENT MEDS . amiodarone  400 mg Oral BID  . antiseptic oral rinse  7 mL Mouth Rinse BID  . aspirin EC  81 mg Oral Daily  . famotidine  20 mg Oral BID  . furosemide  60 mg Intravenous BID  . metoprolol tartrate  25 mg Oral BID  . potassium chloride  20 mEq Oral BID  . rivaroxaban  20 mg Oral Q supper    OBJECTIVE   Intake/Output Summary (Last 24 hours) at 01/04/16 1138 Last data filed at 01/04/16 1100  Gross per 24 hour  Intake           1785.1 ml  Output             4850 ml  Net          -3064.9 ml   Filed Weights   01/02/16 0520 01/03/16 0500 01/04/16 0500  Weight: (!) 138.7 kg (305 lb 12.8 oz) (!) 138.5 kg (305 lb 5.4 oz) (!) 138.7 kg (305 lb 12.5 oz)    PHYSICAL EXAM Vitals:   01/04/16 0800 01/04/16 0900 01/04/16 1000 01/04/16 1100  BP: (!) 107/92 114/75 133/83 107/70  Pulse: (!) 50 (!) 103 (!) 108 (!) 43  Resp: (!) 27 16 (!) 22 20  Temp: (!) 96.8 F (36 C)     TempSrc: Oral     SpO2: 98% 97% 99% 96%  Weight:      Height:       General: Alert, oriented x3, no distress. Obese. Head: no evidence of trauma, PERRL, EOMI, no exophtalmos or lid lag, no myxedema, no xanthelasma; normal ears, nose and oropharynx Neck: normal jugular venous pulsations and no hepatojugular reflux; brisk carotid pulses without delay and no carotid bruits Chest: clear to auscultation, no signs of consolidation by percussion or palpation, normal fremitus, symmetrical and full respiratory excursions Cardiovascular: normal position and quality of the apical impulse, regular rhythm, normal first and  second heart sounds, no rubs or gallops, musical 2/6 apical holosystolic murmur murmur Abdomen: no tenderness or distention, no masses by palpation, no abnormal pulsatility or arterial bruits, normal bowel sounds, no hepatosplenomegaly Extremities: no clubbing, cyanosis or edema; 2+ radial, ulnar and brachial pulses bilaterally; 2+ right femoral, posterior tibial and dorsalis pedis pulses; 2+ left femoral, posterior tibial and dorsalis pedis pulses; no subclavian or femoral bruits Neurological: grossly nonfocal  LABS  CBC  Recent Labs  01/01/16 1300  WBC 8.2  NEUTROABS 4.9  HGB 15.0  HCT 44.3  MCV 89.1  PLT 228   Basic Metabolic Panel  Recent Labs  01/01/16 1300  01/03/16 0342 01/04/16 0222  NA 141  < > 138 138  K 3.8  < > 3.8 3.6  CL 109  < > 103 102  CO2 25  < > 27 27  GLUCOSE 90  < > 97 95  BUN 12  < > 14 14  CREATININE 1.08  < > 0.95 1.14  CALCIUM 9.3  < > 9.0 8.7*  MG 1.9  --   --   --   < > = values in this interval  not displayed. Liver Function Tests  Recent Labs  01/01/16 1300  AST 30  ALT 31  ALKPHOS 51  BILITOT 1.2  PROT 7.1  ALBUMIN 4.2   No results for input(s): LIPASE, AMYLASE in the last 72 hours. Cardiac Enzymes  Recent Labs  01/01/16 1300 01/01/16 1852 01/02/16 0040  TROPONINI 0.03* 0.04* 0.04*     Recent Labs  01/01/16 1300  TSH 1.999    Radiology Studies Imaging results have been reviewed and No results found.  TELE Afib, RVR   ASSESSMENT AND PLAN  1. Atrial fibrillation with RVR: Has had ablation in the past in 2015, was doing well and maintaining NSR until a few days ago. He was started on Xarelto first dose was 01/01/16. He was started on diltiazem gtt for rate control, rates still elevated. IV metoprolol given, developed hypotension was transferred to ICU. He was loaded with IV digoxin for rate control. Stable this am. Had TEE/DCCV Friday, but returned to AFib after only one minute of NSR. Reports some left shoulder pain  after the CV, muscle strain, improving. He did not experience that when pads were placed in anterior-posterior configuration.  Plan for repeat DCCV next week, hopefully Monday.  This patients CHA2DS2-VASc Score and unadjusted Ischemic Stroke Rate (% per year) is equal to 0.6 % stroke rate/year from a score of 1 Above score calculated as 1 point each if present [CHF, HTN, DM, Vascular=MI/PAD/Aortic Plaque, Age if 65-74, or Male], 2 points each if present [Age > 75, or Stroke/TIA/TE]   2. Non ischemic cardiomyopathy: Echo shows current EF 25%. Previous EF was 50-55%. His EF has been as low as 10% in the past. BP does not allow RAAS inhibitors at this time.  3. Acute on chronic systolic heart failure: Exacerbated by Afib w RVR, underlying tachycardia- cardiomyopathy.  He says he is breathing better, his cough has subsided. Improved with IV diuresis, continue same for today. Renal function stable. Will monitor.   4. Mitral insufficiency: hopefully this is just secondary to cardiomyopathy and will resolve as the EF improves after rhythm restoration.  5. Pulmonary HTN: secondary to left heart failure and MR, but possibly also a component of inconsistently treated OSA, obesity.  6. OSA, obesity: discussed the impact that these have on the burden of atrial fibrillation      Thurmon Fair, MD, [facility] [phone] office [phone] pager 01/04/2016 11:38 AM

## 2016-01-05 DIAGNOSIS — I5041 Acute combined systolic (congestive) and diastolic (congestive) heart failure: Secondary | ICD-10-CM

## 2016-01-05 MED ORDER — FUROSEMIDE 40 MG PO TABS
40.0000 mg | ORAL_TABLET | Freq: Two times a day (BID) | ORAL | Status: DC
  Administered 2016-01-05 – 2016-01-06 (×3): 40 mg via ORAL
  Filled 2016-01-05 (×3): qty 1

## 2016-01-05 NOTE — Progress Notes (Signed)
Patient Name: Lance [name] Date of Encounter: 01/05/2016  Active Problems:   Atrial fibrillation with rapid ventricular response (HCC)   Nonischemic cardiomyopathy (HCC)   Morbid obesity due to excess calories (HCC)   Atrial fibrillation with RVR (HCC)   Length of Stay: 3  SUBJECTIVE  Briefly hypotensive yesterday PM< promptly improved with IV fluids. Beta blocker dose reduced. Converted to NSR, frequent PACs, still on IV amiodarone.  CURRENT MEDS . amiodarone  400 mg Oral BID  . antiseptic oral rinse  7 mL Mouth Rinse BID  . aspirin EC  81 mg Oral Daily  . famotidine  20 mg Oral BID  . furosemide  40 mg Oral BID  . metoprolol tartrate  12.5 mg Oral BID  . potassium chloride  20 mEq Oral BID  . rivaroxaban  20 mg Oral Q supper    OBJECTIVE   Intake/Output Summary (Last 24 hours) at 01/05/16 0843 Last data filed at 01/05/16 0600  Gross per 24 hour  Intake           1742.6 ml  Output             4475 ml  Net          -2732.4 ml   Filed Weights   01/03/16 0500 01/04/16 0500 01/05/16 0500  Weight: (!) 138.5 kg (305 lb 5.4 oz) (!) 138.7 kg (305 lb 12.5 oz) 136 kg (299 lb 13.2 oz)    PHYSICAL EXAM Vitals:   01/05/16 0500 01/05/16 0600 01/05/16 0700 01/05/16 0839  BP: 106/70 92/75 93/68    Pulse: (!) 51 81 75   Resp: (!) 22 16 (!) 24   Temp:    98.1 F (36.7 C)  TempSrc:    Oral  SpO2: 97% 96% 95%   Weight: 136 kg (299 lb 13.2 oz)     Height:       General: Alert, oriented x3, no distress Head: no evidence of trauma, PERRL, EOMI, no exophtalmos or lid lag, no myxedema, no xanthelasma; normal ears, nose and oropharynx Neck: normal jugular venous pulsations and no hepatojugular reflux; brisk carotid pulses without delay and no carotid bruits Chest: clear to auscultation, no signs of consolidation by percussion or palpation, normal fremitus, symmetrical and full respiratory excursions Cardiovascular: normal position and quality of the apical impulse, regular rhythm,  normal first and second heart sounds, no rubs or gallops, musical 2/6 apical holosystolic murmur Abdomen: no tenderness or distention, no masses by palpation, no abnormal pulsatility or arterial bruits, normal bowel sounds, no hepatosplenomegaly Extremities: no clubbing, cyanosis or edema; 2+ radial, ulnar and brachial pulses bilaterally; 2+ right femoral, posterior tibial and dorsalis pedis pulses; 2+ left femoral, posterior tibial and dorsalis pedis pulses; no subclavian or femoral bruits Neurological: grossly nonfocal  LABS  CBC No results for input(s): WBC, NEUTROABS, HGB, HCT, MCV, PLT in the last 72 hours. Basic Metabolic Panel  Recent Labs  01/03/16 0342 01/04/16 0222  NA 138 138  K 3.8 3.6  CL 103 102  CO2 27 27  GLUCOSE 97 95  BUN 14 14  CREATININE 0.95 1.14  CALCIUM 9.0 8.7*    Radiology Studies Imaging results have been reviewed and No results found.  TELE NSR, frequent PACs  ASSESSMENT AND PLAN  1. Atrial fibrillation with RVR: Has had ablation in the past in 2015. Converted to NSR after approximately 36 hours IV (and PO) amiodarone. Had TEE/DCCV Friday, but returned to AFib after only one minute of NSR.  This  patients CHA2DS2-VASc Score and unadjusted Ischemic Stroke Rate (% per year) is equal to 0.6 % stroke rate/year from a score of 1Above score calculated as 1 point each if present [CHF, HTN, DM, Vascular=MI/PAD/Aortic Plaque, Age if 65-74, or Male], 2 points each if present [Age >75, or Stroke/TIA/TE]   2. Non ischemic cardiomyopathy: Echo shows current EF 25%. Previous EF was 50-55%. His EF has been as low as 10% in the past. BP does not allow RAAS inhibitors at this time, but will need to gradually introduce ACEi.  3. Acute on chronic systolic heart failure: Exacerbated by Afib w RVR, underlying tachycardia- cardiomyopathy. Brief hypotension due to rapid diuresis, resolved. Switch to PO diuretics. Renal function and K stable.  4. Mitral  insufficiency: hopefully this is just secondary to cardiomyopathy and will resolve as the EF improves after rhythm restoration.  5. Pulmonary HTN: secondary to left heart failure and MR, but possibly also a component of inconsistently treated OSA, obesity.  6. OSA, obesity: discussed the impact that these have on the burden of atrial fibrillation    Thurmon Fair, MD, [facility] [phone] office [phone] pager 01/05/2016 8:43 AM

## 2016-01-06 ENCOUNTER — Other Ambulatory Visit: Payer: Self-pay | Admitting: Physician Assistant

## 2016-01-06 DIAGNOSIS — I5023 Acute on chronic systolic (congestive) heart failure: Secondary | ICD-10-CM

## 2016-01-06 LAB — BASIC METABOLIC PANEL
Anion gap: 8 (ref 5–15)
BUN: 9 mg/dL (ref 6–20)
CALCIUM: 9.7 mg/dL (ref 8.9–10.3)
CHLORIDE: 100 mmol/L — AB (ref 101–111)
CO2: 31 mmol/L (ref 22–32)
CREATININE: 1.19 mg/dL (ref 0.61–1.24)
Glucose, Bld: 91 mg/dL (ref 65–99)
Potassium: 4.1 mmol/L (ref 3.5–5.1)
SODIUM: 139 mmol/L (ref 135–145)

## 2016-01-06 MED ORDER — AMIODARONE HCL 200 MG PO TABS: 200.0000 mg | ORAL_TABLET | Freq: Two times a day (BID) | ORAL | 0 refills | Status: DC

## 2016-01-06 MED ORDER — AMIODARONE HCL 200 MG PO TABS: 200.0000 mg | ORAL_TABLET | Freq: Every day | ORAL | 3 refills | Status: DC

## 2016-01-06 MED ORDER — RIVAROXABAN 20 MG PO TABS: 20.0000 mg | ORAL_TABLET | Freq: Every day | ORAL | 3 refills | Status: DC

## 2016-01-06 MED ORDER — FUROSEMIDE 40 MG PO TABS: 40.0000 mg | ORAL_TABLET | Freq: Two times a day (BID) | ORAL | 5 refills | Status: DC

## 2016-01-06 MED ORDER — POTASSIUM CHLORIDE CRYS ER 20 MEQ PO TBCR: 20.0000 meq | EXTENDED_RELEASE_TABLET | Freq: Two times a day (BID) | ORAL | 5 refills | Status: DC

## 2016-01-06 MED ORDER — AMIODARONE HCL 400 MG PO TABS: 400.0000 mg | ORAL_TABLET | Freq: Two times a day (BID) | ORAL | 0 refills | Status: DC

## 2016-01-06 MED ORDER — METOPROLOL TARTRATE 25 MG PO TABS: 12.5000 mg | ORAL_TABLET | Freq: Two times a day (BID) | ORAL | 3 refills | Status: DC

## 2016-01-06 MED ORDER — FAMOTIDINE 20 MG PO TABS: 20.0000 mg | ORAL_TABLET | Freq: Two times a day (BID) | ORAL | 11 refills | Status: DC

## 2016-01-06 NOTE — Discharge Summary (Signed)
Discharge Summary    Patient ID: Lance [name],  MRN: [id number], DOB/AGE: [birth year] 37 y.o.  Admit date: 01/01/2016 Discharge date: 01/06/2016  Primary Care Provider: Sonda Primes Primary Cardiologist: Dr. [name]  Discharge Diagnoses    Principal Problem:   Atrial fibrillation with rapid ventricular response [facility]) Active Problems:   Obstructive sleep apnea   Nonischemic cardiomyopathy (HCC)   Morbid obesity due to excess calories (HCC)   Acute on chronic systolic heart failure (HCC)   Allergies Allergies  Allergen Reactions  . Amiodarone Other (See Comments)    IV intolerance with Severe hypotension, shock after amiodarone bolus.  Taking po Amiodarone without problems. Pt can tolerate IV drip but not bolus.   . Altace [Ramipril]     Cough   . Azithromycin Itching  . Protonix [Pantoprazole Sodium]     Couldn't belch  . Fish Allergy Other (See Comments)    Not shell fish - regular fish causes tingling  . Metoprolol Succinate Other (See Comments)    Body aches at high doses.  Currently takes two tablets of lopressor 25mg  twice daily; three tablets twice daily caused him to ache.    Diagnostic Studies/Procedures   Echo 01/03/2016 LV EF: 25% -   30%  ------------------------------------------------------------------- Indications:      Atrial fibrillation - 427.31.  ------------------------------------------------------------------- History:   PMH:   Atrial fibrillation.  Atrial flutter.  ------------------------------------------------------------------- Study Conclusions  - Left ventricle: The cavity size was mildly dilated. Wall   thickness was increased in a pattern of mild LVH. Indeterminant   diastolic function. Systolic function was severely reduced. The   estimated ejection fraction was in the range of 25% to 30%.   Diffuse hypokinesis. - Aortic valve: Aortic valve not visualized. There was no stenosis. - Mitral valve: There was at least moderate  MR, not fully   visualized. - Left atrium: The atrium was mildly dilated. - Right ventricle: The cavity size was normal. Systolic function   was mildly reduced. - Right atrium: The atrium was mildly dilated. - Tricuspid valve: Peak RV-RA gradient (S): 32 mm Hg. - Pulmonary arteries: PA peak pressure: 47 mm Hg (S). - Systemic veins: IVC measured 2.3 cm with < 50% respirophasic   variation, suggesting RA pressure 15 mmHg.  Impressions:  - The patient was in atrial fibrillation. Mildly dilated LV with   mild LV hypertrophy. EF 25-30% with diffuse hypokinesis. There   was at least moderate MR but the jet was not fully visualized.   Normal RV size with mildly decreased systolic function. Mild   pulmonary hypertension. Dilated IVC.    TEE DCCV 01/03/2016 LA LAA without masses TV normal  Mild TR PV normal AV normal MV is normal  The annulus is dilated at 47 mm  There is at least moderate to severe MR NO PFO by color doppler.  With injection of agitated saline there was rare bubble seen late in LA consistent with intrapulmonary shunt LVEF is severely depressed at 255 Normal thoracic aorta.  Patient anesthetized with propofol by anesthesia WIth pads in Apex/base position patient cardioverted to SR with 200 J synchronized biphasic energy   Pt was in SR for about 1-2 minutes before reverting to atrial fibrillation   _____________   History of Present Illness     Lance [name] a 35 y.o.malewho presents today for a work in visit. He is seen for Dr. [name].   He has had a h/o persistent afib and prior tachy mediated  cardiomyopathy. He has had prior ablation back in 2015. Previously on Xarelto and amiodarone.   Comes in today. Here alone. Has noted a cough over the past few months. Started getting short of breath since vacation over 4th of July. His weight is up. No real swelling however. Some sharp chest pain with coughing. Some slight yellow sputum. Some dizziness yesterday  noted but no passing out. Does endorse a feeling of palpitations - but seems different from prior spells of AF. Not really clear how long this has been going on. Seen PCP earlier this month - CXR with severe CM. Given low dose Lasix and CBC checked. HR was elevated then and noted to be irregular. He has previously not been on any medicines. He has tolerated Xarelto in the past without issue.    Hospital Course     He was admitted from cardiology office for afib with RVR and acute on chronic systolic heart failure. Echocardiogram was obtained on 01/03/2016 which showed  EF 25-30%, diffuse hypokinesis, mild LVH, at least moderate MR, mildly dilated LA, PA peak pressure . Note previously he had tachy-mediated cardiomyopathy with EF reaching as low as 10%, however his EF improved to 50-55% in 02/2014. Given the now decreased EF, he underwent TEE DCCV on 7/28 after placing back on Xarelto. Unfortunately he only managed to maintain NSR for 1-2 min before converting back to afib. He had h/o severe hypotension on IV amiodarone. He also have tried tikosyn in the past, but had frequent ventricular ectopy with concern for Torsades. He was placed on IV amiodarone with close monitoring of BP along with oral amiodarone for faster loading. He eventually converted back to NSR on IV amiodarone. His BP did dip down to 60s on 7/30, however eventually recovered off IV amiodarone.  He was seen in the AM of 7/31 at which time he was doing well without any discomfort. He has been diuresed 6.3 L total, and currently transitioned to  BID PO lasix. His weight is down from 309 lbs down to 300 lbs which will serve as his dry weight. He will be continued on metoprolol 12.5mg  BID at current dose. Although Toprol XL is better for LV dysfunction, it is listed as an allergy. His SBP is low 100 today, too soft to add  losartan. He had h/o cough with ramipril. We have arranged outpatient followup with AFIB clinic, if his BP is  stable, plan to add  losartan on followup. Given the addition of lasix, we will obtain BMET on followup.    We will continue  BID amiodarone for 1 week, then decrease to  BID for 2 weeks, then  daily thereafter. Hopefully his EF will improve with medical therapy and afib control therapy, may consider repeat echo in 3 month to reassess EF. His EKG does show TWI in lateral leads, but he has no CP, and his LV dysfunction is more likely related to tachycardia  _____________  Discharge Vitals Blood pressure 95/63, pulse 77, temperature 97.8 F (36.6 C), temperature source Oral, resp. rate 14, height  (1.88 m), weight (!) 300 lb 7.8 oz (136.3 kg), SpO2 96 %.  Filed Weights   01/04/16 0500 01/05/16 0500 01/06/16 0500  Weight: (!) 305 lb 12.5 oz (138.7 kg) 299 lb 13.2 oz (136 kg) (!) 300 lb 7.8 oz (136.3 kg)    Labs & Radiologic Studies     CBC No results for input(s): WBC, NEUTROABS, HGB, HCT, MCV, PLT in the last 72 hours. Basic  Metabolic Panel  Recent Labs  01/04/16 0222 01/06/16 1225  NA 138 139  K 3.6 4.1  CL 102 100*  CO2 27 31  GLUCOSE 95 91  BUN 14 9  CREATININE 1.14 1.19  CALCIUM 8.7* 9.7    Dg Chest 2 View  Result Date: 12/31/2015 CLINICAL DATA:  Cough.  Shortness of breath. EXAM: CHEST  2 VIEW COMPARISON:  02/08/2015.  08/13/2012.  11/19/2010 . FINDINGS: Mediastinum and hilar structures normal. Cardiomegaly with normal pulmonary vascularity. No focal infiltrate. No pleural effusion or pneumothorax. Chest is stable from multiple prior exams. IMPRESSION: 1.  Severe cardiomegaly.  No interim change. 2. No acute infiltrate. Electronically Signed   By: Maisie Fus  Register   On: 12/31/2015 15:15   Disposition   Pt is being discharged home today in good condition.  Follow-up Plans & Appointments    Follow-up Information    [facility] Follow up on 01/13/2016.   Specialty:  Cardiology Why:  at 9AM. Please obtain repeat BMET lab  on the same day in the hospital.  Contact information: [address] [id number] [place] [address] [phone]       Sonda Primes, MD. Schedule an appointment as soon as possible for a visit today.   Specialty:  Internal Medicine Contact information: [address] [place] [address] [phone]            Discharge Medications   Current Discharge Medication List    START taking these medications   Details  amiodarone (PACERONE) 200 MG tablet Take 1 tablet (200 mg total) by mouth daily. Qty: 90 tablet, Refills: 3    famotidine (PEPCID) 20 MG tablet Take 1 tablet (20 mg total) by mouth 2 (two) times daily. Qty: 60 tablet, Refills: 11    metoprolol tartrate (LOPRESSOR) 25 MG tablet Take 0.5 tablets (12.5 mg total) by mouth 2 (two) times daily. Qty: 90 tablet, Refills: 3    potassium chloride SA (K-DUR,KLOR-CON) 20 MEQ tablet Take 1 tablet (20 mEq total) by mouth 2 (two) times daily. Take with lasix Qty: 60 tablet, Refills: 5    rivaroxaban (XARELTO) 20 MG TABS tablet Take 1 tablet (20 mg total) by mouth daily with supper. Qty: 90 tablet, Refills: 3      CONTINUE these medications which have CHANGED   Details  furosemide (LASIX) 40 MG tablet Take 1 tablet (40 mg total) by mouth 2 (two) times daily. Qty: 60 tablet, Refills: 5      CONTINUE these medications which have NOT CHANGED   Details  fexofenadine (ALLEGRA) 30 MG tablet Take 30 mg by mouth daily.           Outstanding Labs/Studies   BMET on followup  Duration of Discharge Encounter   Greater than 30 minutes including physician time.  Signed, Azalee Course PA-C 01/06/2016, 1:40 PM

## 2016-01-06 NOTE — Progress Notes (Signed)
Patient Name: Lance [name] Date of Encounter: 01/06/2016  Primary Cardiologist: Dr. [name]   Patient info: 35 yo male with PMH of persistent afib s/p ablation 2015, tachy mediated cardiomyopathy presented to cardiology office on 01/01/2016 with SOB since 4th of July. He had EF as low as 10% in the past. Admitted for initiation of anticoagulation, IV diltiazem for rate control and IV lasix for diuresis.   Principal Problem:   Atrial fibrillation with rapid ventricular response (HCC) Active Problems:   Obstructive sleep apnea   Nonischemic cardiomyopathy (HCC)   Morbid obesity due to excess calories (HCC)   Acute on chronic systolic heart failure (HCC)    SUBJECTIVE  Denies any CP or SOB. Feeling well.   CURRENT MEDS . amiodarone  400 mg Oral BID  . antiseptic oral rinse  7 mL Mouth Rinse BID  . aspirin EC  81 mg Oral Daily  . famotidine  20 mg Oral BID  . furosemide  40 mg Oral BID  . metoprolol tartrate  12.5 mg Oral BID  . potassium chloride  20 mEq Oral BID  . rivaroxaban  20 mg Oral Q supper    OBJECTIVE  Vitals:   01/06/16 0800 01/06/16 0844 01/06/16 0900 01/06/16 1000  BP:      Pulse: 72  77 77  Resp: (!) 23  13 14   Temp:  97.7 F (36.5 C)    TempSrc:  Oral    SpO2: 96%  96% 96%  Weight:      Height:        Intake/Output Summary (Last 24 hours) at 01/06/16 1041 Last data filed at 01/06/16 0800  Gross per 24 hour  Intake             1260 ml  Output             1600 ml  Net             -340 ml   Filed Weights   01/04/16 0500 01/05/16 0500 01/06/16 0500  Weight: (!) 305 lb 12.5 oz (138.7 kg) 299 lb 13.2 oz (136 kg) (!) 300 lb 7.8 oz (136.3 kg)    PHYSICAL EXAM  General: Pleasant, NAD. Neuro: Alert and oriented X 3. Moves all extremities spontaneously. Psych: Normal affect. HEENT:  Normal  Neck: Supple without bruits or JVD. Lungs:  Resp regular and unlabored, CTA. Heart: RRR no s3, s4, or murmurs. Abdomen: Soft, non-tender, non-distended, BS +  x 4.  Extremities: No clubbing, cyanosis or edema. DP/PT/Radials 2+ and equal bilaterally.  Accessory Clinical Findings  Basic Metabolic Panel  Recent Labs  01/04/16 0222  NA 138  K 3.6  CL 102  CO2 27  GLUCOSE 95  BUN 14  CREATININE 1.14  CALCIUM 8.7*    TELE NSR overnight, no sign of recurrent afib. 5 beats NSVT    ECG  No new EKG  Echocardiogram 01/03/2016  LV EF: 25% -   30%  ------------------------------------------------------------------- Indications:      Atrial fibrillation - 427.31.  ------------------------------------------------------------------- History:   PMH:   Atrial fibrillation.  Atrial flutter.  ------------------------------------------------------------------- Study Conclusions  - Left ventricle: The cavity size was mildly dilated. Wall   thickness was increased in a pattern of mild LVH. Indeterminant   diastolic function. Systolic function was severely reduced. The   estimated ejection fraction was in the range of 25% to 30%.   Diffuse hypokinesis. - Aortic valve: Aortic valve not visualized. There was no stenosis. -  Mitral valve: There was at least moderate MR, not fully   visualized. - Left atrium: The atrium was mildly dilated. - Right ventricle: The cavity size was normal. Systolic function   was mildly reduced. - Right atrium: The atrium was mildly dilated. - Tricuspid valve: Peak RV-RA gradient (S): 32 mm Hg. - Pulmonary arteries: PA peak pressure: 47 mm Hg (S). - Systemic veins: IVC measured 2.3 cm with < 50% respirophasic   variation, suggesting RA pressure 15 mmHg.  Impressions:  - The patient was in atrial fibrillation. Mildly dilated LV with   mild LV hypertrophy. EF 25-30% with diffuse hypokinesis. There   was at least moderate MR but the jet was not fully visualized.   Normal RV size with mildly decreased systolic function. Mild   pulmonary hypertension. Dilated IVC.    Radiology/Studies  Dg Chest 2  View  Result Date: 12/31/2015 CLINICAL DATA:  Cough.  Shortness of breath. EXAM: CHEST  2 VIEW COMPARISON:  02/08/2015.  08/13/2012.  11/19/2010 . FINDINGS: Mediastinum and hilar structures normal. Cardiomegaly with normal pulmonary vascularity. No focal infiltrate. No pleural effusion or pneumothorax. Chest is stable from multiple prior exams. IMPRESSION: 1.  Severe cardiomegaly.  No interim change. 2. No acute infiltrate. Electronically Signed   By: Maisie Fus  Register   On: 12/31/2015 15:15   ASSESSMENT AND PLAN  1. Recurrent atrial fibrillation s/p ablation in 2015  - This patients CHA2DS2-VASc Score and unadjusted Ischemic Stroke Rate (% per year) is equal to 2.2 % stroke rate/year from a score of 2 Above score calculated as 1 point each if present [CHF, HTN, DM, Vascular=MI/PAD/Aortic Plaque, Age if 65-74, or Male], 2 points each if present [Age > 75, or Stroke/TIA/TE]  - His po Amiodarone and Xarelto were discontinued post ablation per Dr. Johney Frame (office note 05/28/2014). H/o severe hypotension on IV amiodarone. He previously tried Tikosyn but had frequent ventricular ectopy with concern for Torsades  - restarted on Xarelto. Underwent TEE DCCV on 01/03/2016, unfortunately he maintained sinus rhythm for only 1-2 min before reverting back to afib. Initiated on IV amiodarone along with PO amiodarone, IV amiodarone has since been discontinued. Converted back to sinus rhythm.   - continue Xarelto, stop ASA, continue 12.5mg  BID lopressor. Metoprolol succinate would be more beneficial, however it is listed as an allergy. Ramipril cause cough in him, if his BP stable, we can consider add  losartan.  - likely discharge today. Continue amiodarone  BID, decrease to  BID after 7 days, then decrease to  daily after 1 month.   2. Acute on chronic systolic HF/NICM  - Previous EF was 50-55%. His EF has been as low as 10% in 2014, however his EF improved to 50-55% in 02/2014.   - Echo 01/03/2016  EF 25-30%, diffuse hypokinesis, mild LVH, at least moderate MR, mildly dilated LA, PA peak pressure  - s/p IV diuresis. Continue current dose of PO lasix, recheck BMET in 5 days before 7 day TCM followup. Repeat Echo in 3 month.   3. Morbid obesity: consider weight loss.   4. OSA   Signed, Azalee Course PA-C Pager: [phone]

## 2016-01-07 ENCOUNTER — Encounter: Payer: Self-pay | Admitting: Nurse Practitioner

## 2016-01-09 ENCOUNTER — Telehealth: Payer: Self-pay | Admitting: Nurse Practitioner

## 2016-01-09 NOTE — Telephone Encounter (Signed)
Left patient VM-RTW note ready for pick up.

## 2016-01-13 ENCOUNTER — Encounter (HOSPITAL_COMMUNITY): Payer: Self-pay | Admitting: Nurse Practitioner

## 2016-01-13 ENCOUNTER — Ambulatory Visit (HOSPITAL_COMMUNITY)
Admission: RE | Admit: 2016-01-13 | Discharge: 2016-01-13 | Disposition: A | Discharge status: Home / Self Care | Payer: BLUE CROSS/BLUE SHIELD | Source: Ambulatory Visit | Attending: Nurse Practitioner | Admitting: Nurse Practitioner

## 2016-01-13 VITALS — BP 118/72 | HR 73 | Ht 74.0 in | Wt 302.9 lb

## 2016-01-13 DIAGNOSIS — Z811 Family history of alcohol abuse and dependence: Secondary | ICD-10-CM | POA: Insufficient documentation

## 2016-01-13 DIAGNOSIS — G4733 Obstructive sleep apnea (adult) (pediatric): Secondary | ICD-10-CM | POA: Insufficient documentation

## 2016-01-13 DIAGNOSIS — Z8261 Family history of arthritis: Secondary | ICD-10-CM | POA: Insufficient documentation

## 2016-01-13 DIAGNOSIS — Z8249 Family history of ischemic heart disease and other diseases of the circulatory system: Secondary | ICD-10-CM | POA: Diagnosis not present

## 2016-01-13 DIAGNOSIS — Z87891 Personal history of nicotine dependence: Secondary | ICD-10-CM | POA: Diagnosis not present

## 2016-01-13 DIAGNOSIS — Z888 Allergy status to other drugs, medicaments and biological substances status: Secondary | ICD-10-CM | POA: Diagnosis not present

## 2016-01-13 DIAGNOSIS — Z818 Family history of other mental and behavioral disorders: Secondary | ICD-10-CM | POA: Diagnosis not present

## 2016-01-13 DIAGNOSIS — Z79899 Other long term (current) drug therapy: Secondary | ICD-10-CM | POA: Diagnosis not present

## 2016-01-13 DIAGNOSIS — Z833 Family history of diabetes mellitus: Secondary | ICD-10-CM | POA: Diagnosis not present

## 2016-01-13 DIAGNOSIS — I509 Heart failure, unspecified: Secondary | ICD-10-CM | POA: Insufficient documentation

## 2016-01-13 DIAGNOSIS — I481 Persistent atrial fibrillation: Secondary | ICD-10-CM | POA: Diagnosis not present

## 2016-01-13 DIAGNOSIS — I4819 Other persistent atrial fibrillation: Secondary | ICD-10-CM

## 2016-01-13 DIAGNOSIS — I4891 Unspecified atrial fibrillation: Secondary | ICD-10-CM | POA: Diagnosis present

## 2016-01-13 DIAGNOSIS — Z91013 Allergy to seafood: Secondary | ICD-10-CM | POA: Diagnosis not present

## 2016-01-13 DIAGNOSIS — Z9889 Other specified postprocedural states: Secondary | ICD-10-CM | POA: Diagnosis not present

## 2016-01-13 DIAGNOSIS — I739 Peripheral vascular disease, unspecified: Secondary | ICD-10-CM | POA: Insufficient documentation

## 2016-01-13 DIAGNOSIS — Z7901 Long term (current) use of anticoagulants: Secondary | ICD-10-CM | POA: Insufficient documentation

## 2016-01-13 DIAGNOSIS — I4892 Unspecified atrial flutter: Secondary | ICD-10-CM | POA: Diagnosis not present

## 2016-01-13 NOTE — Progress Notes (Signed)
Patient ID: Lance [name], male   DOB: 1981/03/14, 35 y.o.   MRN: [id number]    Primary Care Physician: Sonda Primes, MD Referring Physician:[facility] f/u EP: Dr. Johney Frame Cardiologist: Dr. [name] Lance [name] is a 35 y.o. male with a h/o  h/o persistent afib and prior tachy mediated cardiomyopathy. He has had prior ablation back in 2015. Previously on Xarelto and amiodarone. He was admitted 7/26 thru 7/31 with acute on chronic heart failure, afib with rvr. He was restarted on amiodarone load and xarelto for a chadsvasc score of 1. He had noticed cough for the past few months. Shortness of breath present since the fourth of July and weight increased.EF on this hospitalization 25-30% thought secondary to [facility]. Has failed tikosyn in the past.  He is in the afib clinic for hospital f/u. He is in SR. He has lowered amiodarone dose to 200 mg bid and has instructions to lower to 200 mg daily in 2 weeks .He is being careful re diet especially with overall calories and salt. He is not weighting daily and this was encouraged. Weight today is 302 and his weight was 300 lbs on discharge. He has not been very compliant with CPA but is trying to work back into it. States he may need a BiPAP since he requires such high pressure.He feels much better since restoring SR and diuresis.No alcohol use or smoking history. No excessive caffeine use.  Today, he denies symptoms of palpitations, chest pain, shortness of breath, orthopnea, PND, lower extremity edema, dizziness, presyncope, syncope, or neurologic sequela. The patient is tolerating medications without difficulties and is otherwise without complaint today.   Past Medical History:  Diagnosis Date  . Acute systolic heart failure (HCC) 09/14/2012  . Atrial flutter [facility])    s/p ablation by Dr [name]  . Medical history non-contributory   . Neuromuscular disorder (HCC)   . Onychomycosis   . OSA on CPAP   . Peripheral vascular disease (HCC)   . Persistent  atrial fibrillation (HCC) 2003   Past Surgical History:  Procedure Laterality Date  . ATRIAL FIBRILLATION ABLATION N/A 02/15/2014   Procedure: ATRIAL FIBRILLATION ABLATION;  Surgeon: Gardiner Rhyme, MD;  Location: [facility] CATH LAB;  Service: Cardiovascular;  Laterality: N/A;  . ATRIAL FLUTTER ABLATION  09/14/2012   by Dr [name]  . ATRIAL FLUTTER ABLATION N/A 09/14/2012   Procedure: ATRIAL FLUTTER ABLATION;  Surgeon: Marinus Maw, MD;  Location: [facility] CATH LAB;  Service: Cardiovascular;  Laterality: N/A;  . CARDIAC CATHETERIZATION    . CARDIOVERSION N/A 08/14/2012   Procedure: CARDIOVERSION;  Surgeon: Dolores Patty, MD;  Location: [facility] OR;  Service: Cardiovascular;  Laterality: N/A;  . CARDIOVERSION N/A 01/03/2016   Procedure: CARDIOVERSION;  Surgeon: Pricilla Riffle, MD;  Location: [facility] ENDOSCOPY;  Service: Cardiovascular;  Laterality: N/A;  . MANDIBLE SURGERY     underbite correction  . TEE WITHOUT CARDIOVERSION N/A 02/14/2014   Procedure: TRANSESOPHAGEAL ECHOCARDIOGRAM (TEE);  Surgeon: Quintella Reichert, MD;  Location: [facility] ENDOSCOPY;  Service: Cardiovascular;  Laterality: N/A;  . TEE WITHOUT CARDIOVERSION N/A 01/03/2016   Procedure: TRANSESOPHAGEAL ECHOCARDIOGRAM (TEE);  Surgeon: Pricilla Riffle, MD;  Location: [facility] ENDOSCOPY;  Service: Cardiovascular;  Laterality: N/A;    Current Outpatient Prescriptions  Medication Sig Dispense Refill  . [START ON 01/29/2016] amiodarone (PACERONE) 200 MG tablet Take 1 tablet (200 mg total) by mouth daily. 90 tablet 3  . famotidine (PEPCID) 20 MG tablet Take 1 tablet (20 mg total) by  mouth 2 (two) times daily. 60 tablet 11  . fexofenadine (ALLEGRA) 30 MG tablet Take 30 mg by mouth daily.    . furosemide (LASIX) 40 MG tablet Take 1 tablet (40 mg total) by mouth 2 (two) times daily. 60 tablet 5  . metoprolol tartrate (LOPRESSOR) 25 MG tablet Take 0.5 tablets (12.5 mg total) by mouth 2 (two) times daily. 90 tablet 3  . potassium chloride SA (K-DUR,KLOR-CON) 20 MEQ tablet Take 1  tablet (20 mEq total) by mouth 2 (two) times daily. Take with lasix 60 tablet 5  . rivaroxaban (XARELTO) 20 MG TABS tablet Take 1 tablet (20 mg total) by mouth daily with supper. 90 tablet 3   No current facility-administered medications for this encounter.     Allergies  Allergen Reactions  . Amiodarone Other (See Comments)    IV intolerance with Severe hypotension, shock after amiodarone bolus.  Taking po Amiodarone without problems. Pt can tolerate IV drip but not bolus.   . Altace [Ramipril]     Cough   . Azithromycin Itching  . Protonix [Pantoprazole Sodium]     Couldn't belch  . Fish Allergy Other (See Comments)    Not shell fish - regular fish causes tingling  . Metoprolol Succinate Other (See Comments)    Body aches at high doses.  Currently takes two tablets of lopressor 25mg  twice daily; three tablets twice daily caused him to ache.    Social History   Social History  . Marital status: Single    Spouse name: N/A  . Number of children: N/A  . Years of education: N/A   Occupational History  . [employer]   Social History Main Topics  . Smoking status: Former Smoker    Years: 3.00    Types: Cigarettes, Cigars    Quit date: 05/12/2009  . Smokeless tobacco: Never Used     Comment: 4 cigars daily  . Alcohol use Yes     Comment: Glass of wine on weekends  . Drug use:     Types: Marijuana     Comment: occassional use   . Sexual activity: Yes   Other Topics Concern  . Not on file   Social History Narrative  . No narrative on file    Family History  Problem Relation Age of Onset  . Diabetes Mother   . Hypertension Mother   . Varicose Veins Mother   . Depression Father   . Osteoarthritis Father   . Alcohol abuse Father     ROS- All systems are reviewed and negative except as per the HPI above  Physical Exam: Vitals:   01/13/16 0909  BP: 118/72  Pulse: 73  Weight: (!) 302 lb 14.4 oz (137.4 kg)  Height: 6\' 2"  (1.88 m)    GEN- The  patient is well appearing, alert and oriented x 3 today.   Head- normocephalic, atraumatic Eyes-  Sclera clear, conjunctiva pink Ears- hearing intact Oropharynx- clear Neck- supple, no JVP Lymph- no cervical lymphadenopathy Lungs- Clear to ausculation bilaterally, normal work of breathing Heart- Regular rate and rhythm, no murmurs, rubs or gallops, PMI not laterally displaced GI- soft, NT, ND, + BS Extremities- no clubbing, cyanosis, or edema MS- no significant deformity or atrophy Skin- no rash or lesion Psych- euthymic mood, full affect Neuro- strength and sensation are intact  EKG-NSR at 73 bpm, pr int 144 ms, qrs int 106 ms, qtc 460 ms Epic records reviewed  Assessment and Plan:  1.Persistent afib  In SR Continue with amiodarone load and pt is aware to reduce dose as instructed in d/c instructions Continue xarelto for chadsvasc score of 1( lv dysfunction). Continue with metoprolol Encouraged to lose weight   2. Acute on chronic heart failure, TMC Continue lasix Daily weights  Continue BB Avoid salt  3. OSA Use cpap consistently,  but pt is looking into bipap  Has appointment with Dr. [name] 8/31 Afib clinic as needed   Lupita Leash C. Matthew Fol[facility] [address] [place], [place] [address] [phone]

## 2016-01-16 ENCOUNTER — Encounter: Payer: Self-pay | Admitting: Nurse Practitioner

## 2016-01-30 ENCOUNTER — Encounter (HOSPITAL_COMMUNITY): Payer: Self-pay

## 2016-02-06 ENCOUNTER — Ambulatory Visit (HOSPITAL_COMMUNITY)
Admission: RE | Admit: 2016-02-06 | Discharge: 2016-02-06 | Disposition: A | Discharge status: Home / Self Care | Payer: BLUE CROSS/BLUE SHIELD | Source: Ambulatory Visit | Attending: Internal Medicine | Admitting: Internal Medicine

## 2016-02-06 ENCOUNTER — Encounter (HOSPITAL_COMMUNITY): Payer: Self-pay | Admitting: Internal Medicine

## 2016-02-06 ENCOUNTER — Encounter (HOSPITAL_COMMUNITY): Payer: Self-pay | Admitting: *Deleted

## 2016-02-06 VITALS — BP 106/66 | HR 119 | Wt 297.0 lb

## 2016-02-06 DIAGNOSIS — I739 Peripheral vascular disease, unspecified: Secondary | ICD-10-CM | POA: Insufficient documentation

## 2016-02-06 DIAGNOSIS — I110 Hypertensive heart disease with heart failure: Secondary | ICD-10-CM | POA: Insufficient documentation

## 2016-02-06 DIAGNOSIS — I5022 Chronic systolic (congestive) heart failure: Secondary | ICD-10-CM | POA: Insufficient documentation

## 2016-02-06 DIAGNOSIS — I480 Paroxysmal atrial fibrillation: Secondary | ICD-10-CM | POA: Diagnosis not present

## 2016-02-06 DIAGNOSIS — G4733 Obstructive sleep apnea (adult) (pediatric): Secondary | ICD-10-CM | POA: Insufficient documentation

## 2016-02-06 DIAGNOSIS — Z79899 Other long term (current) drug therapy: Secondary | ICD-10-CM | POA: Diagnosis not present

## 2016-02-06 DIAGNOSIS — I4891 Unspecified atrial fibrillation: Secondary | ICD-10-CM | POA: Diagnosis not present

## 2016-02-06 DIAGNOSIS — Z6838 Body mass index (BMI) 38.0-38.9, adult: Secondary | ICD-10-CM | POA: Insufficient documentation

## 2016-02-06 DIAGNOSIS — I4892 Unspecified atrial flutter: Secondary | ICD-10-CM | POA: Diagnosis not present

## 2016-02-06 DIAGNOSIS — Z7901 Long term (current) use of anticoagulants: Secondary | ICD-10-CM | POA: Insufficient documentation

## 2016-02-06 MED ORDER — AMIODARONE HCL 200 MG PO TABS: 200.0000 mg | ORAL_TABLET | Freq: Two times a day (BID) | ORAL | 3 refills | Status: DC

## 2016-02-06 NOTE — Patient Instructions (Signed)
Appointment with Dr.Allred and Amber on 02/19/2016 at 9:00a.m.  Increase Amiodarone to 200mg  twice daily..  Call our office at [phone] if your heart rate is greater than 60.  Follow up with Dr.Bensimhon in 1 month

## 2016-02-06 NOTE — Progress Notes (Signed)
Pt's FMLA forms completed and signed by Dr [name].  Forms faxed to [employer] at [phone], copy also given to pt at OV.

## 2016-02-06 NOTE — Progress Notes (Signed)
ADVANCED HF CLINIC NOTE  Patient ID: Lance [name], male   DOB: 05-22-1981, 35 y.o.   MRN: [id number]  EP: Dr Johney Frame  HPI: Lance [name] is a 35 y.o. AAM with PMH significant for PAF, HTN, OSA, Chronic Systolic Heart Failure - EF 10-15% 08/2012, and A Fib.     Admitted 08/2012 and found to be in Afib with RVR. He was started on amiodarone and became profoundly hypotensive requiring levophed and IVF.  He underwent TEE and failed DCCV on 3/9.  Dr. [name] placed him on Tikosyn and he had polymorphic VT.  Transitioned to oral amiodarone with good rate response.  Echo during admission showed EF 10% with diffuse hypokinesis.  He was diuresed and discharged on amiodarone, xarelto, digoxin, ramipril, lopressor and lasix.    09/14/12 S/P A-Flutter ablation. EF subsequently returned to normal.   Readmitted in July 2017 with HF and found to have recurrent AF with RVR. EF back down to 25-30%. Underwent attempt at [facility] on 01/03/16. Maintained NSR for 2 mintues and then back to AF. He was placed on IV amiodarone with close monitoring of BP along with oral amiodarone for faster loading. He eventually converted back to NSR on IV amiodarone. D/c weight 300 pounds.   He returns for follow up. Feels fine. Watching diet more closely. Much more active without dyspnea. Recently decreased amio to 200 daily. Since that time notes HR on Fitbit occasionally up to 120 then back to 60. No bleeding with Xarelto. Weight down to 290. No palpitations or syncope.    ROS: All systems negative except as listed in HPI, PMH and Problem List.  Past Medical History:  Diagnosis Date  . Acute systolic heart failure (HCC) 09/14/2012  . Atrial flutter [facility])    s/p ablation by Dr [name]  . Medical history non-contributory   . Neuromuscular disorder (HCC)   . Onychomycosis   . OSA on CPAP   . Peripheral vascular disease (HCC)   . Persistent atrial fibrillation (HCC) 2003    Current Outpatient Prescriptions  Medication Sig Dispense  Refill  . amiodarone (PACERONE) 200 MG tablet Take 1 tablet (200 mg total) by mouth daily. 90 tablet 3  . famotidine (PEPCID) 20 MG tablet Take 1 tablet (20 mg total) by mouth 2 (two) times daily. 60 tablet 11  . fexofenadine (ALLEGRA) 30 MG tablet Take 30 mg by mouth daily.    . furosemide (LASIX) 40 MG tablet Take 1 tablet (40 mg total) by mouth 2 (two) times daily. 60 tablet 5  . metoprolol tartrate (LOPRESSOR) 25 MG tablet Take 0.5 tablets (12.5 mg total) by mouth 2 (two) times daily. 90 tablet 3  . potassium chloride SA (K-DUR,KLOR-CON) 20 MEQ tablet Take 1 tablet (20 mEq total) by mouth 2 (two) times daily. Take with lasix 60 tablet 5  . rivaroxaban (XARELTO) 20 MG TABS tablet Take 1 tablet (20 mg total) by mouth daily with supper. 90 tablet 3   No current facility-administered medications for this encounter.      PHYSICAL EXAM: Vitals:   02/06/16 1156  BP: 106/66  Pulse: (!) 119  SpO2: 99%  Weight: 297 lb (134.7 kg)    General:  Well appearing. No resp difficulty HEENT: normal Neck: supple. JVP flat. Carotids 2+ bilaterally; no bruits. No lymphadenopathy or thryomegaly appreciated. Cor: PMI normal. Tachy regular rate & rhythm. No rubs, gallops or murmurs. Lungs: clear Abdomen: soft, nontender, nondistended. No hepatosplenomegaly. No bruits or masses. Good bowel sounds. Extremities: no  cyanosis, clubbing, rash, edema Neuro: alert & orientedx3, cranial nerves grossly intact. Moves all 4 extremities w/o difficulty. Affect pleasant.  ECG AFL 120s  ASSESSMENT & PLAN: 1. PAF/Atrial flutter  -s/p AFL ablation 09/2012 - s/p AF ablation 9/15  -s/p DC-CV 7/17  -on amio & Xarelto  -ChadsVAsc = 1 (HF)   - He is back in AFL today with RVR. Based on his FitBit readings this is intermittent and worse since decreasing amiodarone to 200 daily. Will increase amio back up to 200 bid. He will continue to follow the trend on his FitBit and contact us if he majority of HRs > 1000 bpm.  - I  spoke with Magdalene MollyAmber Siler, NP in EP and she will arrange to see him with Dr. Johney FrameAllred next wee for possible repeat ablation.  - We completed his FMLA paperwork in clinic 2. Chronic systolic HF, likely tachy mediated  -echo EF 25-30%  - NYHA I. Volume status ok.   - Continue current regimen. Expect EF will improve again with HR control.   - Continue b-blocker. Will need ARBARNI as BP tolerates.  3. OSA   --difficult tolerating CPAP. Will do his best to comply. Discussed relationship with AF and need to control OSA to get AF control.  4. Morbid obesity  Total time spent 45 minutes. Over half that time spent discussing above.    Davit Vassar, Terrelle,MD 12:30 PM    .

## 2016-02-11 ENCOUNTER — Encounter: Payer: BLUE CROSS/BLUE SHIELD | Admitting: Internal Medicine

## 2016-02-17 ENCOUNTER — Ambulatory Visit (INDEPENDENT_AMBULATORY_CARE_PROVIDER_SITE_OTHER): Payer: BLUE CROSS/BLUE SHIELD | Admitting: Internal Medicine

## 2016-02-17 ENCOUNTER — Encounter: Payer: Self-pay | Admitting: Internal Medicine

## 2016-02-17 VITALS — BP 120/84 | HR 49 | Temp 98.6°F | Ht 74.0 in | Wt 298.0 lb

## 2016-02-17 DIAGNOSIS — I4891 Unspecified atrial fibrillation: Secondary | ICD-10-CM

## 2016-02-17 DIAGNOSIS — Z0000 Encounter for general adult medical examination without abnormal findings: Secondary | ICD-10-CM

## 2016-02-17 DIAGNOSIS — I5022 Chronic systolic (congestive) heart failure: Secondary | ICD-10-CM | POA: Diagnosis not present

## 2016-02-17 DIAGNOSIS — I5023 Acute on chronic systolic (congestive) heart failure: Secondary | ICD-10-CM | POA: Diagnosis not present

## 2016-02-17 DIAGNOSIS — G4733 Obstructive sleep apnea (adult) (pediatric): Secondary | ICD-10-CM | POA: Diagnosis not present

## 2016-02-17 DIAGNOSIS — Z23 Encounter for immunization: Secondary | ICD-10-CM

## 2016-02-17 DIAGNOSIS — I5021 Acute systolic (congestive) heart failure: Secondary | ICD-10-CM

## 2016-02-17 NOTE — Assessment & Plan Note (Signed)
Dr Johney Frame S/p hosp stay 7/17 No ETOH

## 2016-02-17 NOTE — Progress Notes (Deleted)
Electrophysiology Office Note Date: 02/17/2016  ID:  Lance [name], DOB 10-Jan-1981, MRN [id number]  PCP: Sonda Primes, MD Primary Cardiologist: Bensimhon Electrophysiologist: Allred  CC: follow up atrial arrhythmias   Lance [name] is a 35 y.o. male seen today for Dr Johney Frame.  He is s/p flutter ablation in 09/2012 and PVI 02/2014.  He has had subsequent atrial fibrillation and last underwent DCCV 12/2015.  He had torsades with Tikosyn and is currently maintained on amiodarone.   He presents today for routine electrophysiology followup.  Since last being seen in our clinic, the patient reports doing very well.  He denies chest pain, palpitations, dyspnea, PND, orthopnea, nausea, vomiting, dizziness, syncope, edema, weight gain, or early satiety.  Past Medical History:  Diagnosis Date  . Acute systolic heart failure (HCC) 09/14/2012  . Atrial flutter [facility])    s/p ablation by Dr [name]  . Medical history non-contributory   . Neuromuscular disorder (HCC)   . Onychomycosis   . OSA on CPAP   . Peripheral vascular disease (HCC)   . Persistent atrial fibrillation (HCC) 2003   Past Surgical History:  Procedure Laterality Date  . ATRIAL FIBRILLATION ABLATION N/A 02/15/2014   Procedure: ATRIAL FIBRILLATION ABLATION;  Surgeon: Gardiner Rhyme, MD;  Location: [facility] CATH LAB;  Service: Cardiovascular;  Laterality: N/A;  . ATRIAL FLUTTER ABLATION  09/14/2012   by Dr [name]  . ATRIAL FLUTTER ABLATION N/A 09/14/2012   Procedure: ATRIAL FLUTTER ABLATION;  Surgeon: Marinus Maw, MD;  Location: [facility] CATH LAB;  Service: Cardiovascular;  Laterality: N/A;  . CARDIAC CATHETERIZATION    . CARDIOVERSION N/A 08/14/2012   Procedure: CARDIOVERSION;  Surgeon: Dolores Patty, MD;  Location: [facility] OR;  Service: Cardiovascular;  Laterality: N/A;  . CARDIOVERSION N/A 01/03/2016   Procedure: CARDIOVERSION;  Surgeon: Pricilla Riffle, MD;  Location: [facility] ENDOSCOPY;  Service: Cardiovascular;  Laterality: N/A;  . MANDIBLE  SURGERY     underbite correction  . TEE WITHOUT CARDIOVERSION N/A 02/14/2014   Procedure: TRANSESOPHAGEAL ECHOCARDIOGRAM (TEE);  Surgeon: Quintella Reichert, MD;  Location: [facility] ENDOSCOPY;  Service: Cardiovascular;  Laterality: N/A;  . TEE WITHOUT CARDIOVERSION N/A 01/03/2016   Procedure: TRANSESOPHAGEAL ECHOCARDIOGRAM (TEE);  Surgeon: Pricilla Riffle, MD;  Location: [facility] ENDOSCOPY;  Service: Cardiovascular;  Laterality: N/A;    Current Outpatient Prescriptions  Medication Sig Dispense Refill  . amiodarone (PACERONE) 200 MG tablet Take 1 tablet (200 mg total) by mouth 2 (two) times daily. 180 tablet 3  . famotidine (PEPCID) 20 MG tablet Take 1 tablet (20 mg total) by mouth 2 (two) times daily. 60 tablet 11  . fexofenadine (ALLEGRA) 30 MG tablet Take 30 mg by mouth daily.    . furosemide (LASIX) 40 MG tablet Take 1 tablet (40 mg total) by mouth 2 (two) times daily. 60 tablet 5  . metoprolol tartrate (LOPRESSOR) 25 MG tablet Take 0.5 tablets (12.5 mg total) by mouth 2 (two) times daily. 90 tablet 3  . potassium chloride SA (K-DUR,KLOR-CON) 20 MEQ tablet Take 1 tablet (20 mEq total) by mouth 2 (two) times daily. Take with lasix 60 tablet 5  . rivaroxaban (XARELTO) 20 MG TABS tablet Take 1 tablet (20 mg total) by mouth daily with supper. 90 tablet 3   No current facility-administered medications for this visit.     Allergies:   Amiodarone; Altace [ramipril]; Azithromycin; Protonix [pantoprazole sodium]; Fish allergy; and Metoprolol succinate   Social History: Social History   Social History  .  Marital status: Single    Spouse name: N/A  . Number of children: N/A  . Years of education: N/A   Occupational History  . [employer]   Social History Main Topics  . Smoking status: Former Smoker    Years: 3.00    Types: Cigarettes, Cigars    Quit date: 05/12/2009  . Smokeless tobacco: Never Used     Comment: 4 cigars daily  . Alcohol use Yes     Comment: Glass of wine on weekends  .  Drug use:     Types: Marijuana     Comment: occassional use   . Sexual activity: Yes   Other Topics Concern  . Not on file   Social History Narrative  . No narrative on file    Family History: Family History  Problem Relation Age of Onset  . Diabetes Mother   . Hypertension Mother   . Varicose Veins Mother   . Depression Father   . Osteoarthritis Father   . Alcohol abuse Father     Review of Systems: All other systems reviewed and are otherwise negative except as noted above.   Physical Exam: VS:  There were no vitals taken for this visit. , BMI There is no height or weight on file to calculate BMI. Wt Readings from Last 3 Encounters:  02/17/16 298 lb (135.2 kg)  02/06/16 297 lb (134.7 kg)  01/13/16 (!) 302 lb 14.4 oz (137.4 kg)    GEN- The patient is well appearing, alert and oriented x 3 today.   HEENT: normocephalic, atraumatic; sclera clear, conjunctiva pink; hearing intact; oropharynx clear; neck supple, no JVP Lymph- no cervical lymphadenopathy Lungs- Clear to ausculation bilaterally, normal work of breathing.  No wheezes, rales, rhonchi Heart- Regular rate and rhythm, no murmurs, rubs or gallops, PMI not laterally displaced GI- soft, non-tender, non-distended, bowel sounds present, no hepatosplenomegaly Extremities- no clubbing, cyanosis, or edema; DP/PT/radial pulses 2+ bilaterally MS- no significant deformity or atrophy Skin- warm and dry, no rash or lesion  Psych- euthymic mood, full affect Neuro- strength and sensation are intact   EKG:  EKG is ordered today. The ekg ordered today shows ***  Recent Labs: 12/20/2015: Pro B Natriuretic peptide (BNP) 195.0 01/01/2016: ALT 31; B Natriuretic Peptide 302.2; Hemoglobin 15.0; Magnesium 1.9; Platelets 228; TSH 1.999 01/06/2016: BUN 9; Creatinine, Ser 1.19; Potassium 4.1; Sodium 139    Other studies Reviewed: Additional studies/ records that were reviewed today include: Dr Bensimhon's and Dr Jenel LucksAllred's office  notes  Assessment and Plan: 1.  Persistent atrial fibrillation S/p PVI 02/2014 with recurrent atrial fibrillation. He is currently on amiodarone and underwent repeat DCCV 12/2015 but has reverted to AF. With cardiomyopathy, it will be important to try to restore SR.  LA 38 by echo 12/2015 Lifestyle modification continues to be important in our efforts to maintain SR Continue Xarelto for CHADS2VASC of 1  He reports compliance with Xarelto.  Risks, benefits to repeat ablation discussed with the patient today who wishes to proceed. Dr Johney FrameAllred also discussed with patient today. Will plan cardiac CT prior to ablation. Continue Xarelto without interruption.   2.  Obesity There is no height or weight on file to calculate BMI. Weight loss encouraged  3.  OSA Compliance with CPAP encouraged Discussed relationship between OSA and AF today   4.  Chronic systolic heart failure, likely tachycardia mediated Continue current medical therapy Followed closely by AHF clinic His EF normalized in the past with rhythm control  Current medicines are reviewed at length with the patient today.   The patient {ACTIONS; HAS/DOES NOT HAVE:19233} concerns regarding his medicines.  The following changes were made today:  {NONE DEFAULTED:18576::"none"}  Labs/ tests ordered today include: *** No orders of the defined types were placed in this encounter.    Disposition:   Follow up with *** {gen number [id number]} {TIME; UNITS DAY/WEEK/MONTH:19136}   Signed, Gypsy Balsam, NP 02/17/2016 10:49 AM   [facility] [address] [place] [address] [phone] (office) [phone] (fax

## 2016-02-17 NOTE — Assessment & Plan Note (Signed)
Dr Johney Frame S/p hosp stay 7/17 No ETOH, NAS diet

## 2016-02-17 NOTE — Progress Notes (Signed)
Subjective:  Patient ID: Lance [name], male    DOB: July 06, 1980  Age: 35 y.o. MRN: [id number]  CC: Annual Exam   HPI Lance [name] presents for well exam F/u CHF, A fib, cardiomyopathy, OSA not on CPAP (2014)   Outpatient Medications Prior to Visit  Medication Sig Dispense Refill  . amiodarone (PACERONE) 200 MG tablet Take 1 tablet (200 mg total) by mouth 2 (two) times daily. 180 tablet 3  . famotidine (PEPCID) 20 MG tablet Take 1 tablet (20 mg total) by mouth 2 (two) times daily. 60 tablet 11  . fexofenadine (ALLEGRA) 30 MG tablet Take 30 mg by mouth daily.    . furosemide (LASIX) 40 MG tablet Take 1 tablet (40 mg total) by mouth 2 (two) times daily. 60 tablet 5  . metoprolol tartrate (LOPRESSOR) 25 MG tablet Take 0.5 tablets (12.5 mg total) by mouth 2 (two) times daily. 90 tablet 3  . potassium chloride SA (K-DUR,KLOR-CON) 20 MEQ tablet Take 1 tablet (20 mEq total) by mouth 2 (two) times daily. Take with lasix 60 tablet 5  . rivaroxaban (XARELTO) 20 MG TABS tablet Take 1 tablet (20 mg total) by mouth daily with supper. 90 tablet 3   No facility-administered medications prior to visit.     ROS Review of Systems  Constitutional: Negative for appetite change, fatigue and unexpected weight change.  HENT: Negative for congestion, nosebleeds, sneezing, sore throat and trouble swallowing.   Eyes: Negative for itching and visual disturbance.  Respiratory: Negative for cough.   Cardiovascular: Negative for chest pain, palpitations and leg swelling.  Gastrointestinal: Negative for abdominal distention, blood in stool, diarrhea and nausea.  Genitourinary: Negative for frequency and hematuria.  Musculoskeletal: Negative for back pain, gait problem, joint swelling and neck pain.  Skin: Negative for rash.  Neurological: Negative for dizziness, tremors, speech difficulty and weakness.  Psychiatric/Behavioral: Negative for agitation, dysphoric mood and sleep disturbance. The patient is not  nervous/anxious.     Objective:  BP 120/84   Pulse (!) 49   Temp 98.6 F (37 C) (Oral)   Ht 6\' 2"  (1.88 m)   Wt 298 lb (135.2 kg)   SpO2 97%   BMI 38.26 kg/m   BP Readings from Last 3 Encounters:  02/17/16 120/84  02/06/16 106/66  01/13/16 118/72    Wt Readings from Last 3 Encounters:  02/17/16 298 lb (135.2 kg)  02/06/16 297 lb (134.7 kg)  01/13/16 (!) 302 lb 14.4 oz (137.4 kg)    Physical Exam  Constitutional: He is oriented to person, place, and time. He appears well-developed. No distress.  NAD  HENT:  Mouth/Throat: Oropharynx is clear and moist.  Eyes: Conjunctivae are normal. Pupils are equal, round, and reactive to light.  Neck: Normal range of motion. No JVD present. No thyromegaly present.  Cardiovascular: Normal heart sounds and intact distal pulses.  Exam reveals no gallop and no friction rub.   No murmur heard. Pulmonary/Chest: Effort normal and breath sounds normal. No respiratory distress. He has no wheezes. He has no rales. He exhibits no tenderness.  Abdominal: Soft. Bowel sounds are normal. He exhibits no distension and no mass. There is no tenderness. There is no rebound and no guarding.  Musculoskeletal: Normal range of motion. He exhibits no edema or tenderness.  Lymphadenopathy:    He has no cervical adenopathy.  Neurological: He is alert and oriented to person, place, and time. He has normal reflexes. No cranial nerve deficit. He exhibits normal muscle tone.  He displays a negative Romberg sign. Coordination and gait normal.  Skin: Skin is warm and dry. No rash noted.  Psychiatric: He has a normal mood and affect. His behavior is normal. Judgment and thought content normal.  Obese Huston FoleyBrady; [facility]  Hosp labs reviewed  Lab Results  Component Value Date   WBC 8.2 01/01/2016   HGB 15.0 01/01/2016   HCT 44.3 01/01/2016   PLT 228 01/01/2016   GLUCOSE 91 01/06/2016   CHOL 149 02/08/2015   TRIG 62.0 02/08/2015   HDL 42.30 02/08/2015   LDLCALC 95  02/08/2015   ALT 31 01/01/2016   AST 30 01/01/2016   NA 139 01/06/2016   K 4.1 01/06/2016   CL 100 (L) 01/06/2016   CREATININE 1.19 01/06/2016   BUN 9 01/06/2016   CO2 31 01/06/2016   TSH 1.999 01/01/2016   PSA 0.45 02/01/2013   INR 1.07 01/01/2016    No results found.  Assessment & Plan:   There are no diagnoses linked to this encounter. I am having Lance [name] maintain his fexofenadine, metoprolol tartrate, rivaroxaban, famotidine, furosemide, potassium chloride SA, and amiodarone.  No orders of the defined types were placed in this encounter.    Follow-up: No Follow-up on file.  Sonda PrimesAlex Plotnikov, MD

## 2016-02-17 NOTE — Addendum Note (Signed)
Addended by: Merrilyn Puma on: 02/17/2016 02:36 PM   Modules accepted: Orders

## 2016-02-17 NOTE — Progress Notes (Signed)
Pre visit review using our clinic review tool, if applicable. No additional management support is needed unless otherwise documented below in the visit note. 

## 2016-02-17 NOTE — Assessment & Plan Note (Signed)
We discussed age appropriate health related issues, including available/recomended screening tests and vaccinations. We discussed a need for adhering to healthy diet and exercise. Labs/EKG were reviewed/ordered. All questions were answered. NAS diet No ETOH

## 2016-02-17 NOTE — Assessment & Plan Note (Signed)
Dr Allred S/p hosp stay 7/17 No ETOH, NAS diet 

## 2016-02-17 NOTE — Assessment & Plan Note (Addendum)
Not on CPAP Pulm ref - Dr Vassie Loll

## 2016-02-17 NOTE — Assessment & Plan Note (Signed)
Better Wt Readings from Last 3 Encounters:  02/17/16 298 lb (135.2 kg)  02/06/16 297 lb (134.7 kg)  01/13/16 (!) 302 lb 14.4 oz (137.4 kg)

## 2016-02-18 ENCOUNTER — Encounter: Payer: Self-pay | Admitting: Nurse Practitioner

## 2016-02-19 ENCOUNTER — Encounter: Payer: Self-pay | Admitting: Nurse Practitioner

## 2016-02-19 ENCOUNTER — Ambulatory Visit: Payer: BLUE CROSS/BLUE SHIELD | Admitting: Nurse Practitioner

## 2016-02-20 ENCOUNTER — Encounter: Payer: Self-pay | Admitting: Nurse Practitioner

## 2016-03-05 ENCOUNTER — Encounter: Payer: Self-pay | Admitting: Internal Medicine

## 2016-03-12 ENCOUNTER — Encounter (HOSPITAL_COMMUNITY): Payer: Self-pay | Admitting: Internal Medicine

## 2016-03-12 ENCOUNTER — Ambulatory Visit (HOSPITAL_COMMUNITY)
Admission: RE | Admit: 2016-03-12 | Discharge: 2016-03-12 | Disposition: A | Discharge status: Home / Self Care | Payer: BLUE CROSS/BLUE SHIELD | Source: Ambulatory Visit | Attending: Internal Medicine | Admitting: Internal Medicine

## 2016-03-12 VITALS — BP 116/70 | HR 68 | Wt 297.0 lb

## 2016-03-12 DIAGNOSIS — Z7901 Long term (current) use of anticoagulants: Secondary | ICD-10-CM | POA: Diagnosis not present

## 2016-03-12 DIAGNOSIS — G4733 Obstructive sleep apnea (adult) (pediatric): Secondary | ICD-10-CM | POA: Insufficient documentation

## 2016-03-12 DIAGNOSIS — I5022 Chronic systolic (congestive) heart failure: Secondary | ICD-10-CM

## 2016-03-12 DIAGNOSIS — I480 Paroxysmal atrial fibrillation: Secondary | ICD-10-CM | POA: Insufficient documentation

## 2016-03-12 DIAGNOSIS — I739 Peripheral vascular disease, unspecified: Secondary | ICD-10-CM | POA: Insufficient documentation

## 2016-03-12 DIAGNOSIS — I110 Hypertensive heart disease with heart failure: Secondary | ICD-10-CM | POA: Diagnosis not present

## 2016-03-12 DIAGNOSIS — Z79899 Other long term (current) drug therapy: Secondary | ICD-10-CM | POA: Insufficient documentation

## 2016-03-12 DIAGNOSIS — I4892 Unspecified atrial flutter: Secondary | ICD-10-CM

## 2016-03-12 DIAGNOSIS — Z6838 Body mass index (BMI) 38.0-38.9, adult: Secondary | ICD-10-CM | POA: Insufficient documentation

## 2016-03-12 NOTE — Patient Instructions (Signed)
Make follow up appointment with Dr. Johney Frame.  Will schedule you for an echocardiogram at [facility]. Address: [address] ([address]), [place], [place] [address]  Phone: [phone]  Follow up with Dr. [name] in 3 months.  Do the following things EVERYDAY: 1) Weigh yourself in the morning before breakfast. Write it down and keep it in a log. 2) Take your medicines as prescribed 3) Eat low salt foods-Limit salt (sodium) to 2000 mg per day.  4) Stay as active as you can everyday 5) Limit all fluids for the day to less than 2 liters

## 2016-03-12 NOTE — Progress Notes (Signed)
ADVANCED HF CLINIC NOTE  Patient ID: Lance [name], male   DOB: 21-Nov-1980, 35 y.o.   MRN: [id number]  EP: Dr Johney Frame  HPI: Lance [name] is a 35 y.o. AAM with PMH significant for PAF, HTN, OSA, Chronic Systolic Heart Failure - EF 10-15% 08/2012, and A Fib.     Admitted 08/2012 and found to be in Afib with RVR. He was started on amiodarone and became profoundly hypotensive requiring levophed and IVF.  He underwent TEE and failed DCCV on 3/9.  Dr. [name] placed him on Tikosyn and he had polymorphic VT.  Transitioned to oral amiodarone with good rate response.  Echo during admission showed EF 10% with diffuse hypokinesis.  He was diuresed and discharged on amiodarone, xarelto, digoxin, ramipril, lopressor and lasix.    09/14/12 S/P A-Flutter ablation. EF subsequently returned to normal.   Readmitted in July 2017 with HF and found to have recurrent AF with RVR. EF back down to 25-30%. Underwent attempt at [facility] on 01/03/16. Maintained NSR for 2 mintues and then back to AF. He was placed on IV amiodarone with close monitoring of BP along with oral amiodarone for faster loading. He eventually converted back to NSR on IV amiodarone. D/c weight 300 pounds.   He returns for follow up. Last visit he had recurrent AFL with RVR. Amio was increased to 200 mg twice a day and promptly went back into NSR. We arranged for him to go back to EP but he missed appt. Has been monitoring HRs on FitBit and maintaining NSR. Feels great.  Denies SOB/PND/Orthopnea. Taking all medications. No bleeding problems    ROS: All systems negative except as listed in HPI, PMH and Problem List.  Past Medical History:  Diagnosis Date  . Acute systolic heart failure (HCC) 09/14/2012  . Atrial flutter [facility])    s/p ablation by Dr [name]  . Medical history non-contributory   . Neuromuscular disorder (HCC)   . Onychomycosis   . OSA on CPAP   . Peripheral vascular disease (HCC)   . Persistent atrial fibrillation (HCC) 2003    Current  Outpatient Prescriptions  Medication Sig Dispense Refill  . amiodarone (PACERONE) 200 MG tablet Take 1 tablet (200 mg total) by mouth 2 (two) times daily. 180 tablet 3  . famotidine (PEPCID) 20 MG tablet Take 1 tablet (20 mg total) by mouth 2 (two) times daily. 60 tablet 11  . fexofenadine (ALLEGRA) 30 MG tablet Take 30 mg by mouth daily.    . furosemide (LASIX) 40 MG tablet Take 1 tablet (40 mg total) by mouth 2 (two) times daily. 60 tablet 5  . metoprolol tartrate (LOPRESSOR) 25 MG tablet Take 0.5 tablets (12.5 mg total) by mouth 2 (two) times daily. 90 tablet 3  . potassium chloride SA (K-DUR,KLOR-CON) 20 MEQ tablet Take 1 tablet (20 mEq total) by mouth 2 (two) times daily. Take with lasix 60 tablet 5  . rivaroxaban (XARELTO) 20 MG TABS tablet Take 1 tablet (20 mg total) by mouth daily with supper. 90 tablet 3   No current facility-administered medications for this encounter.      PHYSICAL EXAM: Vitals:   03/12/16 1514  BP: 116/70  Pulse: 68  SpO2: 99%  Weight: 297 lb (134.7 kg)    General:  Well appearing. No resp difficulty HEENT: normal Neck: supple. JVP flat. Carotids 2+ bilaterally; no bruits. No lymphadenopathy or thryomegaly appreciated. Cor: PMI normal. RRR No rubs, gallops or murmurs. Lungs: clear Abdomen: soft, nontender, nondistended. No  hepatosplenomegaly. No bruits or masses. Good bowel sounds. Extremities: no cyanosis, clubbing, rash, edema Neuro: alert & orientedx3, cranial nerves grossly intact. Moves all 4 extremities w/o difficulty. Affect pleasant.    ASSESSMENT & PLAN: 1. PAF/Atrial flutter  -s/p AFL ablation 09/2012 - s/p AF ablation 9/15  -s/p DC-CV 7/17  -ChadsVAsc = 1 (HF)   -Had recurrent episode of AFL with RVR in 9/17. Amio increased to 200 bid and now maintaining NSR. Given his young age do not want him to stay on amio indefinitely. Will refer back to EP to discuss repeat ablation vs. switching AAs. Continue Xarelto. 2. 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 Volume status ok.   - Continue current regimen. Expect EF will improve again with HR control. Will repeat ECHO  - Continue b-blocker. If EF remains low will add ARNI.  3. OSA   --difficult tolerating CPAP. Will do his best to comply. Discussed relationship with AF and need to control OSA to get AF control.  4. Morbid obesity  Amy Clegg NP-C  3:39 PM   Patient seen and examined with Tonye BecketAmy Clegg, NP. We discussed all aspects of the encounter. I agree with the assessment and plan as stated above.   I have edited the note with my changes. Continue amio 200 bid. Continue Xarelto. Repeat echo. Refer back to EP as above. If EF doesn;t recover with restoration of NSR. Add ARNI.   Maiya Kates, Caesar,MD 9:35 PM        .

## 2016-03-13 DIAGNOSIS — I872 Venous insufficiency (chronic) (peripheral): Secondary | ICD-10-CM | POA: Diagnosis not present

## 2016-03-27 ENCOUNTER — Ambulatory Visit (HOSPITAL_COMMUNITY): Payer: BLUE CROSS/BLUE SHIELD | Attending: Cardiology

## 2016-03-27 ENCOUNTER — Other Ambulatory Visit: Payer: Self-pay

## 2016-03-27 DIAGNOSIS — I5022 Chronic systolic (congestive) heart failure: Secondary | ICD-10-CM | POA: Insufficient documentation

## 2016-03-27 DIAGNOSIS — G4733 Obstructive sleep apnea (adult) (pediatric): Secondary | ICD-10-CM | POA: Insufficient documentation

## 2016-03-27 DIAGNOSIS — I071 Rheumatic tricuspid insufficiency: Secondary | ICD-10-CM | POA: Diagnosis not present

## 2016-03-27 DIAGNOSIS — I110 Hypertensive heart disease with heart failure: Secondary | ICD-10-CM | POA: Insufficient documentation

## 2016-03-27 DIAGNOSIS — I480 Paroxysmal atrial fibrillation: Secondary | ICD-10-CM | POA: Diagnosis not present

## 2016-03-27 DIAGNOSIS — I340 Nonrheumatic mitral (valve) insufficiency: Secondary | ICD-10-CM | POA: Diagnosis not present

## 2016-03-27 DIAGNOSIS — Z6838 Body mass index (BMI) 38.0-38.9, adult: Secondary | ICD-10-CM | POA: Diagnosis not present

## 2016-03-27 MED ORDER — PERFLUTREN LIPID MICROSPHERE
1.0000 mL | INTRAVENOUS | Status: AC | PRN
  Administered 2016-03-27: 2 mL via INTRAVENOUS

## 2016-04-22 ENCOUNTER — Ambulatory Visit (INDEPENDENT_AMBULATORY_CARE_PROVIDER_SITE_OTHER): Payer: BLUE CROSS/BLUE SHIELD | Admitting: Pulmonary Disease

## 2016-04-22 ENCOUNTER — Encounter: Payer: Self-pay | Admitting: Pulmonary Disease

## 2016-04-22 VITALS — BP 128/72 | HR 72 | Ht 74.0 in | Wt 302.8 lb

## 2016-04-22 DIAGNOSIS — G4733 Obstructive sleep apnea (adult) (pediatric): Secondary | ICD-10-CM | POA: Diagnosis not present

## 2016-04-22 NOTE — Progress Notes (Signed)
Subjective:    Patient ID: Lance [name], male    DOB: 08/22/1980, 35 y.o.   MRN: [id number]  HPI   Chief Complaint  Patient presents with  . sleep consult    Per Plotnikov. prior sleep study in 2003. pt c/o daytime sleepiness, loud snoring, headaches & stops breathing during sleep.     35 year old obese man with nonischemic cardiomyopathy presents for evaluation of sleep-disordered breathing.  He was diagnosed with severe OSA in 2004 when he weighed 245 pounds and placed on CPAP of 14 cm he uses this for about a year but had problems with belching and feeling bloated in the mornings. He stopped using this shortly after.  He reports onset of atrial fibrillation in 2003 and has undergone ablation in 2014 and 2015. He was diagnosed with nonischemic myopathy in 2014 and EF is now noted to be about 30%. He had an episode of acute systolic heart failure in 12/2015.  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  He does not have a fixed bedtime and goes to sleep anywhere from 10:30 PM to 1:30 AM. Sleep latency is minimal, he sleeps on his back with 2 pillows he has been told that snoring is worse on his back, apneas have been witnessed by his bed partner. He reports one nocturnal awakening and nocturia due to diuretics and is out of bed by 7 AM feeling tired with occasional dryness of mouth  There is no history suggestive of cataplexy, sleep paralysis or parasomnias   Significant tests/ events  PSG 05/2003- RDI 61/hour, weight 245 pounds, TST 418 minutes 06/2003- CPAP 14 cm    Past Medical History:  Diagnosis Date  . Acute systolic heart failure (HCC) 09/14/2012  . Atrial flutter [facility](HCC)    s/p ablation by Dr Ladona Ridgelaylor  . Medical history non-contributory   . Neuromuscular disorder (HCC)   . Onychomycosis   . OSA on CPAP   . Peripheral  vascular disease (HCC)   . Persistent atrial fibrillation (HCC) 2003     Past Surgical History:  Procedure Laterality Date  . ATRIAL FIBRILLATION ABLATION N/A 02/15/2014   Procedure: ATRIAL FIBRILLATION ABLATION;  Surgeon: Gardiner RhymeJames D Allred, MD;  Location: [facility] CATH LAB;  Service: Cardiovascular;  Laterality: N/A;  . ATRIAL FLUTTER ABLATION  09/14/2012   by Dr Ladona Ridgelaylor  . ATRIAL FLUTTER ABLATION N/A 09/14/2012   Procedure: ATRIAL FLUTTER ABLATION;  Surgeon: Marinus MawGregg W Taylor, MD;  [facility];  Service: Cardiovascular;  Laterality: N/A;  . CARDIAC CATHETERIZATION    . CARDIOVERSION N/A 08/14/2012   Procedure: CARDIOVERSION;  Surgeon: Dolores Pattyaniel R Bensimhon, MD;  [facility];  Service: Cardiovascular;  Laterality: N/A;  . CARDIOVERSION N/A 01/03/2016   Procedure: CARDIOVERSION;  Surgeon: Pricilla RifflePaula V Ross, MD;  [facility];  Service: Cardiovascular;  Laterality: N/A;  . MANDIBLE SURGERY     underbite correction  . TEE WITHOUT CARDIOVERSION N/A 02/14/2014   Procedure: TRANSESOPHAGEAL ECHOCARDIOGRAM (TEE);  Surgeon: Quintella Reichertraci R Turner, MD;  [facility];  Service: Cardiovascular;  Laterality: N/A;  . TEE WITHOUT CARDIOVERSION N/A 01/03/2016   Procedure: TRANSESOPHAGEAL ECHOCARDIOGRAM (TEE);  Surgeon: Pricilla RifflePaula V Ross, MD;  [facility];  Service: Cardiovascular;  Laterality: N/A;    Allergies  Allergen Reactions  . Amiodarone Other (See Comments)    IV intolerance  with Severe hypotension, shock after amiodarone bolus.  Taking po Amiodarone without problems. Pt can tolerate IV drip but not bolus.   . Altace [Ramipril]     Cough   . Azithromycin Itching  . Protonix [Pantoprazole Sodium]     Couldn't belch  . Fish Allergy Other (See Comments)    Not shell fish - regular fish causes tingling  . Metoprolol Succinate Other (See Comments)    Body aches at high doses.  Currently takes two tablets of lopressor 25mg  twice daily; three tablets twice daily caused him to ache.     Social History   Social History  . Marital status: Single    Spouse name: N/A  . Number of children: N/A  . Years of education: N/A   Occupational History  . [employer]   Social History Main Topics  . Smoking status: Former Smoker    Years: 3.00    Types: Cigarettes, Cigars    Quit date: 05/12/2009  . Smokeless tobacco: Never Used     Comment: 4 cigars daily  . Alcohol use Yes     Comment: Glass of wine on weekends  . Drug use:     Types: Marijuana     Comment: occassional use   . Sexual activity: Yes   Other Topics Concern  . Not on file   Social History Narrative  . No narrative on file     Family History  Problem Relation Age of Onset  . Diabetes Mother   . Hypertension Mother   . Varicose Veins Mother   . Depression Father   . Osteoarthritis Father   . Alcohol abuse Father     Review of Systems  Constitutional: Negative for fever and unexpected weight change.  HENT: Positive for nosebleeds. Negative for congestion, dental problem, ear pain, postnasal drip, rhinorrhea, sinus pressure, sneezing, sore throat and trouble swallowing.   Eyes: Positive for redness. Negative for itching.  Respiratory: Negative for cough, chest tightness, shortness of breath and wheezing.   Cardiovascular: Negative for palpitations and leg swelling.  Gastrointestinal: Negative for nausea and vomiting.  Genitourinary: Negative for dysuria.  Musculoskeletal: Negative for joint swelling.  Skin: Negative for rash.  Neurological: Negative for headaches.  Hematological: Bruises/bleeds easily.  Psychiatric/Behavioral: Negative for dysphoric mood. The patient is not nervous/anxious.        Objective:   Physical Exam   Gen. Pleasant, obese, in no distress, normal affect ENT - no lesions, no post nasal drip, class 3 airway Neck: No JVD, no thyromegaly, no carotid bruits Lungs: no use of accessory muscles, no dullness to percussion, decreased without rales or  rhonchi  Cardiovascular: Rhythm regular, heart sounds  normal, no murmurs or gallops, no peripheral edema Abdomen: soft and non-tender, no hepatosplenomegaly, BS normal. Musculoskeletal: No deformities, no cyanosis or clubbing Neuro:  alert, non focal, no tremors        Assessment & Plan:

## 2016-04-22 NOTE — Patient Instructions (Signed)
Schedule sleep study  Based on this we will get restarted on the machine

## 2016-04-22 NOTE — Assessment & Plan Note (Signed)
He does seem to have severe OSA based on his sleep study in 2004 when he weighed 245 pounds. He is gained more than 50 pounds since and his OSA must definitely be worse. It is also conceivable that due to his nonischemic cardiomyopathy he has central apneas now. As such he needs an attended polysomnogram. We will proceed with a split study and intervene with PAP as needed. Given his poor tolerance of high CPAP pressure in the past he may very well need bilevel-but we will have to confirm with an attended titration that this does not make  central apneas worse. We may have to closely monitor him on PAP therapy for worsening of congestive heart failure  I discussed all these issues with him and was able to convince him for an attended study. We will follow his progress with you

## 2016-06-06 DIAGNOSIS — I872 Venous insufficiency (chronic) (peripheral): Secondary | ICD-10-CM | POA: Diagnosis not present

## 2016-06-18 ENCOUNTER — Ambulatory Visit (HOSPITAL_BASED_OUTPATIENT_CLINIC_OR_DEPARTMENT_OTHER): Payer: BLUE CROSS/BLUE SHIELD | Attending: Pulmonary Disease | Admitting: Pulmonary Disease

## 2016-06-18 DIAGNOSIS — G4733 Obstructive sleep apnea (adult) (pediatric): Secondary | ICD-10-CM | POA: Diagnosis not present

## 2016-06-19 ENCOUNTER — Ambulatory Visit (HOSPITAL_COMMUNITY)
Admission: RE | Admit: 2016-06-19 | Discharge: 2016-06-19 | Disposition: A | Discharge status: Home / Self Care | Payer: BLUE CROSS/BLUE SHIELD | Source: Ambulatory Visit | Attending: Internal Medicine | Admitting: Internal Medicine

## 2016-06-19 VITALS — BP 118/58 | HR 54 | Wt 304.8 lb

## 2016-06-19 DIAGNOSIS — Z6839 Body mass index (BMI) 39.0-39.9, adult: Secondary | ICD-10-CM | POA: Insufficient documentation

## 2016-06-19 DIAGNOSIS — I5022 Chronic systolic (congestive) heart failure: Secondary | ICD-10-CM | POA: Insufficient documentation

## 2016-06-19 DIAGNOSIS — I739 Peripheral vascular disease, unspecified: Secondary | ICD-10-CM | POA: Insufficient documentation

## 2016-06-19 DIAGNOSIS — I480 Paroxysmal atrial fibrillation: Secondary | ICD-10-CM

## 2016-06-19 DIAGNOSIS — Z7901 Long term (current) use of anticoagulants: Secondary | ICD-10-CM | POA: Insufficient documentation

## 2016-06-19 DIAGNOSIS — I110 Hypertensive heart disease with heart failure: Secondary | ICD-10-CM | POA: Diagnosis not present

## 2016-06-19 DIAGNOSIS — I4892 Unspecified atrial flutter: Secondary | ICD-10-CM | POA: Diagnosis not present

## 2016-06-19 DIAGNOSIS — Z9889 Other specified postprocedural states: Secondary | ICD-10-CM | POA: Diagnosis not present

## 2016-06-19 DIAGNOSIS — G4733 Obstructive sleep apnea (adult) (pediatric): Secondary | ICD-10-CM | POA: Diagnosis not present

## 2016-06-19 DIAGNOSIS — I4891 Unspecified atrial fibrillation: Secondary | ICD-10-CM

## 2016-06-19 MED ORDER — LOSARTAN POTASSIUM 25 MG PO TABS: 25.0000 mg | ORAL_TABLET | Freq: Every day | ORAL | 2 refills | Status: DC

## 2016-06-19 NOTE — Progress Notes (Signed)
ADVANCED HF CLINIC NOTE  Patient ID: Lance [name], male   DOB: 25-Dec-1980, 36 y.o.   MRN: [id number]  EP: Dr Lance [name]  HPI: Lance [name] is a 36 y.o. AAM with PMH significant for PAF, HTN, OSA, Chronic Systolic Heart Failure - EF 10-15% 08/2012, and A Fib.     Admitted 08/2012 and found to be in Afib with RVR. He was started on amiodarone and became profoundly hypotensive requiring levophed and IVF.  He underwent TEE and failed DCCV on 3/9.  Dr. Ladona [name] placed him on Tikosyn and he had polymorphic VT.  Transitioned to oral amiodarone with good rate response.  Echo during admission showed EF 10% with diffuse hypokinesis.  He was diuresed and discharged on amiodarone, xarelto, digoxin, ramipril, lopressor and lasix.    09/14/12 S/P A-Flutter ablation. EF subsequently returned to normal.   Readmitted in July 2017 with HF and found to have recurrent AF with RVR. EF back down to 25-30%. Underwent attempt at [facility] on 01/03/16. Maintained NSR for 2 mintues and then back to AF. He was placed on IV amiodarone with close monitoring of BP along with oral amiodarone for faster loading. He eventually converted back to NSR on IV amiodarone. D/c weight 300 pounds.   Echo 10/17 EF 35-40%  He returns for follow up. Remains on amio 200 mg bid.  Feels good. Has been monitoring HRs on FitBit and says every 3-4 days his HR jumps to 105-110 for 10 mins and goes away. Denies SOB/PND/Orthopnea. Taking all medications. No bleeding problems . Having some fatigue and morning HAs. Had repeat sleep study last night and working with Dr. Vassie [name].   ROS: All systems negative except as listed in HPI, PMH and Problem List.  Past Medical History:  Diagnosis Date  . Acute systolic heart failure (HCC) 09/14/2012  . Atrial flutter [facility])    s/p ablation by Dr Lance [name]  . Medical history non-contributory   . Neuromuscular disorder (HCC)   . Onychomycosis   . OSA on CPAP   . Peripheral vascular disease (HCC)   . Persistent atrial  fibrillation (HCC) 2003    Current Outpatient Prescriptions  Medication Sig Dispense Refill  . amiodarone (PACERONE) 200 MG tablet Take 1 tablet (200 mg total) by mouth 2 (two) times daily. 180 tablet 3  . famotidine (PEPCID) 20 MG tablet Take 1 tablet (20 mg total) by mouth 2 (two) times daily. 60 tablet 11  . fexofenadine (ALLEGRA) 30 MG tablet Take 30 mg by mouth daily.    . furosemide (LASIX) 40 MG tablet Take 1 tablet (40 mg total) by mouth 2 (two) times daily. 60 tablet 5  . metoprolol tartrate (LOPRESSOR) 25 MG tablet Take 0.5 tablets (12.5 mg total) by mouth 2 (two) times daily. 90 tablet 3  . potassium chloride SA (K-DUR,KLOR-CON) 20 MEQ tablet Take 1 tablet (20 mEq total) by mouth 2 (two) times daily. Take with lasix 60 tablet 5  . rivaroxaban (XARELTO) 20 MG TABS tablet Take 1 tablet (20 mg total) by mouth daily with supper. 90 tablet 3   No current facility-administered medications for this encounter.      PHYSICAL EXAM: Vitals:   06/19/16 0954  BP: (!) 118/58  Pulse: (!) 54  SpO2: 98%  Weight: (!) 304 lb 12 oz (138.2 kg)   Filed Weights   06/19/16 0954  Weight: (!) 304 lb 12 oz (138.2 kg)    General:  Well appearing. No resp difficulty HEENT: normal Neck: supple. JVP  flat. Carotids 2+ bilaterally; no bruits. No lymphadenopathy or thryomegaly appreciated. Cor: PMI normal. RRR No rubs, gallops or murmurs. Lungs: clear Abdomen: soft, nontender, nondistended. No hepatosplenomegaly. No bruits or masses. Good bowel sounds. Extremities: no cyanosis, clubbing, rash, edema Neuro: alert & orientedx3, cranial nerves grossly intact. Moves all 4 extremities w/o difficulty. Affect pleasant.    ASSESSMENT & PLAN: 1. PAF/Atrial flutter  -s/p AFL ablation 09/2012 - s/p AF ablation 9/15  -s/p DC-CV 7/17  -ChadsVAsc = 1 (HF)   -Had recurrent episode of AFL with RVR in 9/17. Amio increased to 200 bid and now maintaining NSR. Given his young age do not want him to stay on amio  indefinitely. Has failed Tikosyn in past.  - Seems to be having recurrent AF episodes even on amio. Will wait until he starts Bipap and then place 20-day monitor to reassess.  - If we cannot get him to stable off amio then will refer back to EP to discuss repeat ablation vs. switching AAs. Continue Xarelto. If EF normalizes may be candidate for flecainide. Goal is to eventually get him off amio and avoid ablation if possible.  2. 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 Volume status ok.   - EF improving but still not back to normal. May be consequence of OSA as well as tachy mediated.   - Will repeat echo in 2 months after he is on BiPAP  - Continue b-blocker. HR is low. Unable to titrate  - Add losartan 25 daily 3. OSA   --working closely on Dr. Vassie [name] with this. He is much more dedicated to fixing it. Sleep study with severe mixed OSA. Hopefully will have new equipment in the next month.  4. Morbid obesity  Lance [name], [name] MD 10:05 AM          .

## 2016-06-19 NOTE — Addendum Note (Signed)
Encounter addended by: Suezanne Cheshire, RN on: 06/19/2016 10:33 AM<BR>    Actions taken: Visit diagnoses modified, Order list changed, Diagnosis association updated, Sign clinical note

## 2016-06-19 NOTE — Patient Instructions (Signed)
START taking Losartan 25 mg (1Tablet) Once Daily  30-Day Event monitor has been ordered for you  Follow up with Echo in 2 Months

## 2016-06-29 ENCOUNTER — Telehealth: Payer: Self-pay | Admitting: Pulmonary Disease

## 2016-06-29 DIAGNOSIS — G4730 Sleep apnea, unspecified: Secondary | ICD-10-CM | POA: Diagnosis not present

## 2016-06-29 NOTE — Procedures (Signed)
Patient Name: Lance [name], Lance [name] Date: 06/18/2016 Gender: Male D.O.B: 05-Jun-1981 Age (years): 35 Referring Provider: Kara Mead MD, ABSM Height (inches): 74 Interpreting Physician: Kara Mead MD, ABSM Weight (lbs): 296 RPSGT: Peak, Robert BMI: 38 MRN: [id number] Neck Size: 17.00   CLINICAL INFORMATION The patient is referred for a split night study with BiPAP.   MEDICATIONS Medications self-administered by patient taken the night of the study : LASIX, Rodrigo Ran, PACERONE, KLOR-CON  SLEEP STUDY TECHNIQUE As per the AASM Manual for the Scoring of Sleep and Associated Events v2.3 (April 2016) with a hypopnea requiring 4% desaturations.  The channels recorded and monitored were frontal, central and occipital EEG, electrooculogram (EOG), submentalis EMG (chin), nasal and oral airflow, thoracic and abdominal wall motion, anterior tibialis EMG, snore microphone, electrocardiogram, and pulse oximetry. Bi-level positive airway pressure (BiPAP) was initiated when the patient met split night criteria and was titrated according to treat sleep-disordered breathing.  RESPIRATORY PARAMETERS Diagnostic  Total AHI (/hr): 81.4 RDI (/hr): 81.4 OA Index (/hr): 32.2 CA Index (/hr): 4.1 REM AHI (/hr): 64.3 NREM AHI (/hr): 87.5 Supine AHI (/hr): 81.4 Non-supine AHI (/hr): N/A Min O2 Sat (%): 56.00 Mean O2 (%): 81.60 Time below 88% (min): 88.9   Titration  Optimal IPAP Pressure (cm): 18 Optimal EPAP Pressure (cm): 14 AHI at Optimal Pressure (/hr): 0.0 Min O2 at Optimal Pressure (%): 96.0 Sleep % at Optimal (%): 99 Supine % at Optimal (%): 100       SLEEP ARCHITECTURE The study was initiated at 10:44:08 PM and terminated at 5:26:35 AM. The total recorded time was 402.4 minutes. EEG confirmed total sleep time was 373.7 minutes yielding a sleep efficiency of 92.9%. Sleep onset after lights out was 16.7 minutes with a REM latency of 73.5 minutes. The patient spent 5.35% of the night in stage  N1 sleep, 68.69% in stage N2 sleep, 0.00% in stage N3 and 25.96% in REM. Wake after sleep onset (WASO) was 12.0 minutes. The Arousal Index was 28.7/hour.  LEG MOVEMENT DATA The total Periodic Limb Movements of Sleep (PLMS) were 0. The PLMS index was 0.00 .  CARDIAC DATA The 2 lead EKG demonstrated sinus rhythm. The mean heart rate was 68.91 beats per minute. Other EKG findings include: Atrial Fibrillation.   IMPRESSIONS - Severe obstructive sleep apnea occurred during the diagnostic portion of the study (AHI = 81.4 /hour).He did not tolerate CPAP due to difficulty exhaling against CPAP pressure. An optimal BiPAP pressure was selected for this patient ( 18 / 14cm of water) - No significant central sleep apnea occurred during the diagnostic portion of the study (CAI = 4.1/hour). - The patient snored with Loud snoring volume during the diagnostic portion of the study. - EKG findings include Atrial Fibrillation. - Clinically significant periodic limb movements of sleep did not occur during the study.    DIAGNOSIS - Obstructive Sleep Apnea (327.23 [G47.33 ICD-10])   RECOMMENDATIONS - Trial of BiPAP therapy on 18/14 cm H2O with a Large size Resmed Full Face Mask AirFit F20 mask and heated humidification. - Avoid alcohol, sedatives and other CNS depressants that may worsen sleep apnea and disrupt normal sleep architecture. - Sleep hygiene should be reviewed to assess factors that may improve sleep quality. - Weight management and regular exercise should be initiated or continued. - Return to Sleep Center for re-evaluation.   Kara Mead MD Board Certified in [place]

## 2016-06-29 NOTE — Telephone Encounter (Signed)
Pl send Rx to DME for Auto BiPAP therapy - PS 4cm, IPAP 10-20, EPAP 5-15  with a Large size Resmed Full Face Mask AirFit F20 mask and heated humidification. DL in 4 wks OV in 6 wks

## 2016-06-30 ENCOUNTER — Other Ambulatory Visit: Payer: Self-pay

## 2016-06-30 DIAGNOSIS — G4733 Obstructive sleep apnea (adult) (pediatric): Secondary | ICD-10-CM

## 2016-06-30 NOTE — Telephone Encounter (Signed)
Left message for patient to call back and schedule appt. Order for CPAP has been placed.

## 2016-07-26 ENCOUNTER — Other Ambulatory Visit: Payer: Self-pay | Admitting: Physician Assistant

## 2016-07-27 ENCOUNTER — Other Ambulatory Visit (HOSPITAL_COMMUNITY): Payer: Self-pay | Admitting: Cardiology

## 2016-07-27 MED ORDER — FUROSEMIDE 40 MG PO TABS: 40.0000 mg | ORAL_TABLET | Freq: Two times a day (BID) | ORAL | 5 refills | Status: DC

## 2016-07-27 MED ORDER — POTASSIUM CHLORIDE CRYS ER 20 MEQ PO TBCR: 20.0000 meq | EXTENDED_RELEASE_TABLET | Freq: Two times a day (BID) | ORAL | 5 refills | Status: DC

## 2016-07-27 NOTE — Telephone Encounter (Signed)
Please review for refill. Thanks!  

## 2016-07-30 ENCOUNTER — Telehealth: Payer: Self-pay | Admitting: Pulmonary Disease

## 2016-07-30 NOTE — Telephone Encounter (Signed)
Called and spoke with Misty Stanley at [employer].  She stated that [name] called the pt to get him set up for his new cpap machine and the pt stated that at this time, he is not able to afford this now, but he will contact them once he is able to set this up.  Will forward to RA as an FYI.

## 2016-07-31 ENCOUNTER — Ambulatory Visit: Payer: BLUE CROSS/BLUE SHIELD

## 2016-08-11 ENCOUNTER — Ambulatory Visit: Payer: BLUE CROSS/BLUE SHIELD | Admitting: Pulmonary Disease

## 2016-08-28 ENCOUNTER — Encounter (HOSPITAL_COMMUNITY): Payer: Self-pay | Admitting: Internal Medicine

## 2016-08-28 ENCOUNTER — Encounter (HOSPITAL_COMMUNITY): Payer: BLUE CROSS/BLUE SHIELD | Admitting: Internal Medicine

## 2016-08-28 ENCOUNTER — Ambulatory Visit (HOSPITAL_BASED_OUTPATIENT_CLINIC_OR_DEPARTMENT_OTHER)
Admission: RE | Admit: 2016-08-28 | Discharge: 2016-08-28 | Disposition: A | Discharge status: Home / Self Care | Payer: BLUE CROSS/BLUE SHIELD | Source: Ambulatory Visit | Attending: Internal Medicine | Admitting: Internal Medicine

## 2016-08-28 ENCOUNTER — Ambulatory Visit (HOSPITAL_COMMUNITY)
Admission: RE | Admit: 2016-08-28 | Discharge: 2016-08-28 | Disposition: A | Discharge status: Home / Self Care | Payer: BLUE CROSS/BLUE SHIELD | Source: Ambulatory Visit | Attending: Internal Medicine | Admitting: Internal Medicine

## 2016-08-28 VITALS — BP 146/86 | HR 67 | Wt 304.0 lb

## 2016-08-28 DIAGNOSIS — I110 Hypertensive heart disease with heart failure: Secondary | ICD-10-CM | POA: Diagnosis not present

## 2016-08-28 DIAGNOSIS — I340 Nonrheumatic mitral (valve) insufficiency: Secondary | ICD-10-CM | POA: Diagnosis not present

## 2016-08-28 DIAGNOSIS — I480 Paroxysmal atrial fibrillation: Secondary | ICD-10-CM

## 2016-08-28 DIAGNOSIS — I071 Rheumatic tricuspid insufficiency: Secondary | ICD-10-CM | POA: Insufficient documentation

## 2016-08-28 DIAGNOSIS — G4733 Obstructive sleep apnea (adult) (pediatric): Secondary | ICD-10-CM | POA: Insufficient documentation

## 2016-08-28 DIAGNOSIS — I5022 Chronic systolic (congestive) heart failure: Secondary | ICD-10-CM | POA: Diagnosis not present

## 2016-08-28 DIAGNOSIS — I4891 Unspecified atrial fibrillation: Secondary | ICD-10-CM | POA: Diagnosis not present

## 2016-08-28 LAB — ECHOCARDIOGRAM COMPLETE
CHL CUP MV DEC (S): 296
CHL CUP STROKE VOLUME: 55 mL
E/e' ratio: 5.24
EWDT: 296 ms
FS: 26 % — AB (ref 28–44)
IV/PV OW: 0.76
LA diam end sys: 36 mm
LA diam index: 1.31 cm/m2
LA vol: 48.6 mL
LASIZE: 36 mm
LAVOLA4C: 42.3 mL
LAVOLIN: 17.7 mL/m2
LDCA: 4.52 cm2
LV E/e' medial: 5.24
LV dias vol: 109 mL (ref 62–150)
LV e' LATERAL: 13.1 cm/s
LV sys vol index: 20 mL/m2
LVDIAVOLIN: 40 mL/m2
LVEEAVG: 5.24
LVOT SV: 81 mL
LVOT VTI: 18 cm
LVOT peak grad rest: 3 mmHg
LVOTD: 24 mm
LVOTPV: 86.2 cm/s
LVSYSVOL: 54 mL (ref 21–61)
MV pk A vel: 47.6 m/s
MV pk E vel: 68.6 m/s
PW: 11.6 mm — AB (ref 0.6–1.1)
RV LATERAL S' VELOCITY: 12.7 cm/s
Simpson's disk: 50
TAPSE: 23.8 mm
TDI e' lateral: 13.1
TDI e' medial: 8.27

## 2016-08-28 MED ORDER — FLECAINIDE ACETATE 100 MG PO TABS: 100.0000 mg | ORAL_TABLET | Freq: Two times a day (BID) | ORAL | 6 refills | Status: DC

## 2016-08-28 NOTE — Addendum Note (Signed)
Encounter addended by: Noralee Space, RN on: 08/28/2016 11:53 AM<BR>    Actions taken: Medication long-term status modified, Order list changed, Sign clinical note

## 2016-08-28 NOTE — Patient Instructions (Signed)
Stop Amiodarone  Start Flecainide 100 mg Twice daily   Your physician recommends that you schedule a follow-up appointment in: 3 months

## 2016-08-28 NOTE — Progress Notes (Signed)
ADVANCED HF CLINIC NOTE  Patient ID: Lance [name], male   DOB: 1981-01-06, 36 y.o.   MRN: [id number]  EP: Dr Johney Frame  HPI: Lance [name] is a 36 y.o. AAM with PMH significant for PAF, HTN, OSA, Chronic Systolic Heart Failure - EF 10-15% 08/2012, and A Fib.     Admitted 08/2012 and found to be in Afib with RVR. He was started on amiodarone and became profoundly hypotensive requiring levophed and IVF.  He underwent TEE and failed DCCV on 3/9.  Dr. [name] placed him on Tikosyn and he had polymorphic VT.  Transitioned to oral amiodarone with good rate response.  Echo during admission showed EF 10% with diffuse hypokinesis.  He was diuresed and discharged on amiodarone, xarelto, digoxin, ramipril, lopressor and lasix.    09/14/12 S/P A-Flutter ablation. EF subsequently returned to normal.   Readmitted in July 2017 with HF and found to have recurrent AF with RVR. EF back down to 25-30%. Underwent attempt at [facility] on 01/03/16. Maintained NSR for 2 mintues and then back to AF. He was placed on IV amiodarone with close monitoring of BP along with oral amiodarone for faster loading. He eventually converted back to NSR on IV amiodarone. D/c weight 300 pounds.   Echo 10/17 EF 35-40%  Returns today for HF follow up. He has been feeling well, he did get a sleep study. He needs CPAP, but has not gotten yet due to cost. He suspects he can get it in June once his deductible is lower. Has missed about 4 doses of his meds in the past month. Has not taken losartan today, he took it yesterday. He has been eating a low salt diet, no red meats. Drinking more than 2L a day. Weighs every other day, weights 299-302. Mountain bikes without SOB, no SOB with stairs.  No palpitations. No further AF. Trying not to drink ETOH.   Echo today Personally reviewed EF 55% RV normal  ROS: All systems negative except as listed in HPI, PMH and Problem List.  Past Medical History:  Diagnosis Date  . Acute systolic heart failure (HCC)  09/14/8117  . Atrial flutter [facility])    s/p ablation by Dr [name]  . Medical history non-contributory   . Neuromuscular disorder (HCC)   . Onychomycosis   . OSA on CPAP   . Peripheral vascular disease (HCC)   . Persistent atrial fibrillation (HCC) 2003    Current Outpatient Prescriptions  Medication Sig Dispense Refill  . amiodarone (PACERONE) 200 MG tablet Take 1 tablet (200 mg total) by mouth 2 (two) times daily. 180 tablet 3  . famotidine (PEPCID) 20 MG tablet Take 1 tablet (20 mg total) by mouth 2 (two) times daily. 60 tablet 11  . fexofenadine (ALLEGRA) 30 MG tablet Take 30 mg by mouth daily.    . furosemide (LASIX) 40 MG tablet Take 1 tablet (40 mg total) by mouth 2 (two) times daily. 60 tablet 5  . losartan (COZAAR) 25 MG tablet Take 1 tablet (25 mg total) by mouth daily. 30 tablet 2  . metoprolol tartrate (LOPRESSOR) 25 MG tablet Take 0.5 tablets (12.5 mg total) by mouth 2 (two) times daily. 90 tablet 3  . potassium chloride SA (K-DUR,KLOR-CON) 20 MEQ tablet Take 1 tablet (20 mEq total) by mouth 2 (two) times daily. Take with lasix 60 tablet 5  . rivaroxaban (XARELTO) 20 MG TABS tablet Take 1 tablet (20 mg total) by mouth daily with supper. 90 tablet 3   No  current facility-administered medications for this encounter.      PHYSICAL EXAM: Vitals:   08/28/16 1105  BP: (!) 146/86  Pulse: 67  SpO2: 98%  Weight: (!) 304 lb (137.9 kg)   Filed Weights   08/28/16 1105  Weight: (!) 304 lb (137.9 kg)    General:  Well appearing. No resp difficulty HEENT: normal anicteric Neck: supple. JVP 6-7 Carotids 2+ bilaterally; no bruits. No lymphadenopathy or thryomegaly appreciated. Cor: PMI normal. RRR No rubs, gallops or murmurs. No s3 Lungs: clear no wheeze Abdomen: obese soft, nontender, nondistended. No hepatosplenomegaly. No bruits or masses. Good bowel sounds. Extremities: no cyanosis, clubbing, rash, edema  warm Neuro: alert & orientedx3, cranial nerves grossly intact. Moves  all 4 extremities w/o difficulty. Affect pleasant.    ASSESSMENT & PLAN: 1. PAF/Atrial flutter  -s/p AFL ablation 09/2012 - s/p AF ablation 9/15  -s/p DC-CV 7/17  -ChadsVAsc = 2 (HF, HTN)   -Had recurrent episode of AFL with RVR in 9/17. Amio increased to 200 bid and now maintaining NSR. Given his young age do not want him to stay on amio indefinitely. Has failed Tikosyn in past.  - Seems to be having recurrent AF episodes even on amio. Will wait until he starts Bipap and then place 30-day monitor to reassess.  - If we cannot get him to stable off amio then will refer back to EP to discuss repeat ablation vs. switching AAs. Continue Xarelto. If EF normalizes may be candidate for flecainide. Goal is to eventually get him off amio and avoid ablation if possible.  2. 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 Volume status ok.   - EF improving but still not back to normal. May be consequence of OSA as well as tachy mediated.   - Will repeat echo in 2 months after he is on BiPAP  - Continue b-blocker. HR is low. Unable to titrate  - Add losartan 25 daily 3. OSA   --working closely on Dr. Vassie Loll with this. He is much more dedicated to fixing it. Sleep study with severe mixed OSA. Hopefully will have new equipment in the next month.  4. Morbid obesity  Little Ishikawa MD 11:28 AM   Patient seen and examined with Suzzette Righter, NP. We discussed all aspects of the encounter. I agree with the assessment and plan as stated above.   Overall doing well. Maintaining NSR. Volume status llok god. Echo reviewed EF 55%. Normal RV. Given young age I want him to get off amio. With normal EF stop amio. Start flecainide 100 bid. Continue Xarelto. Stressed need for CAP.   [name], MD  11:43 AM           .

## 2016-08-28 NOTE — Progress Notes (Signed)
  Echocardiogram 2D Echocardiogram has been performed.  Delcie Roch 08/28/2016, 11:50 AM

## 2016-09-17 ENCOUNTER — Telehealth: Payer: Self-pay

## 2016-09-17 NOTE — Telephone Encounter (Signed)
error 

## 2016-09-18 ENCOUNTER — Other Ambulatory Visit: Payer: Self-pay

## 2016-09-18 ENCOUNTER — Encounter: Payer: Self-pay | Admitting: Pulmonary Disease

## 2016-09-18 ENCOUNTER — Ambulatory Visit (INDEPENDENT_AMBULATORY_CARE_PROVIDER_SITE_OTHER): Payer: BLUE CROSS/BLUE SHIELD | Admitting: Pulmonary Disease

## 2016-09-18 ENCOUNTER — Telehealth: Payer: Self-pay | Admitting: Pulmonary Disease

## 2016-09-18 DIAGNOSIS — G4733 Obstructive sleep apnea (adult) (pediatric): Secondary | ICD-10-CM

## 2016-09-18 DIAGNOSIS — I428 Other cardiomyopathies: Secondary | ICD-10-CM | POA: Diagnosis not present

## 2016-09-18 NOTE — Assessment & Plan Note (Signed)
Benefits of CPAP discussed. No central apneas noted on BiPAP during titration

## 2016-09-18 NOTE — Telephone Encounter (Signed)
Spoke with pt and informed him, that the order will be placed and we will send it over. [place] is aware to place the order, the pt was seen today by Vassie Loll. Nothing further is needed

## 2016-09-18 NOTE — Assessment & Plan Note (Signed)
Call us when you are ready to proceed and we will [facility]  Weight loss encouraged, compliance with goal of at least 4-6 hrs every night is the expectation. Advised against medications with sedative side effects Cautioned against driving when sleepy - understanding that sleepiness will vary on a day to day basis

## 2016-09-18 NOTE — Progress Notes (Signed)
   Subjective:    Patient ID: KYL GIVLER, male    DOB: Mar 06, 1981, 36 y.o.   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  He was diagnosed with severe OSA in 2004 when he weighed 245 pounds and placed on CPAP of 14 cm he uses this for about a year but had problems with belching and feeling bloated in the mornings. He stopped using this shortly after.  He reports onset of atrial fibrillation in 2003 and has undergone ablation in 2014 and 2015. He was diagnosed with nonischemic myopathy in 2014 and EF Has recovered to 50% . He had an episode of acute systolic heart failure in 12/2015.  Repeat PSG showed severe OSA, prescription was sent in for auto BiPAP but his co-pay was too high and he had not met his deductible hence he would like to defer starting on the machine. His sleeping is still bad and he complaints of fatigue and tiredness throughout the day. He does report symptoms going back to his teenage years when he was very thin  CHF has improved, EF has recovered, he denies pedal edema, orthopnea or paroxysmal nocturnal dyspnea. His cough is resolved he is now on losartan. Amiodarone has been stopped and he is on flecainide   Significant tests/ events  PSG 05/2003- RDI 61/hour, weight 245 pounds, TST 418 minutes 06/2003- CPAP 14 cm  06/2016 Severe  (AHI = 81.4 /hour).He did not tolerate CPAP due to difficulty exhaling against CPAP pressure.  optimal BiPAP pressure was 18 / 14cm    Review of Systems Patient denies significant dyspnea,cough, hemoptysis,  chest pain, palpitations, pedal edema, orthopnea, paroxysmal nocturnal dyspnea, lightheadedness, nausea, vomiting, abdominal or  leg pains      Objective:   Physical Exam   Gen. Pleasant, obese, in no distress ENT - no lesions, no post nasal drip Neck: No JVD, no thyromegaly, no carotid bruits Lungs: no use of accessory muscles, no dullness to percussion, decreased without  rales or rhonchi  Cardiovascular: Rhythm regular, heart sounds  normal, no murmurs or gallops, no peripheral edema Musculoskeletal: No deformities, no cyanosis or clubbing , no tremors        Assessment & Plan:

## 2016-09-18 NOTE — Patient Instructions (Signed)
Call us when you are ready to proceed and we will send prescription [facility]  He will need follow-up visit after 30 days of using the machine

## 2016-09-26 ENCOUNTER — Other Ambulatory Visit (HOSPITAL_COMMUNITY): Payer: Self-pay | Admitting: Internal Medicine

## 2016-12-04 ENCOUNTER — Encounter (HOSPITAL_COMMUNITY): Payer: BLUE CROSS/BLUE SHIELD

## 2016-12-24 ENCOUNTER — Encounter (HOSPITAL_COMMUNITY): Payer: Self-pay | Admitting: Nurse Practitioner

## 2016-12-24 ENCOUNTER — Encounter (HOSPITAL_COMMUNITY): Payer: Self-pay | Admitting: *Deleted

## 2016-12-24 ENCOUNTER — Ambulatory Visit (HOSPITAL_COMMUNITY)
Admission: RE | Admit: 2016-12-24 | Discharge: 2016-12-24 | Disposition: A | Discharge status: Home / Self Care | Payer: BLUE CROSS/BLUE SHIELD | Source: Ambulatory Visit | Attending: Nurse Practitioner | Admitting: Nurse Practitioner

## 2016-12-24 VITALS — BP 110/78 | HR 76 | Ht 74.0 in | Wt 301.8 lb

## 2016-12-24 DIAGNOSIS — I481 Persistent atrial fibrillation: Secondary | ICD-10-CM | POA: Diagnosis not present

## 2016-12-24 DIAGNOSIS — Z79899 Other long term (current) drug therapy: Secondary | ICD-10-CM | POA: Diagnosis not present

## 2016-12-24 DIAGNOSIS — I509 Heart failure, unspecified: Secondary | ICD-10-CM | POA: Diagnosis not present

## 2016-12-24 DIAGNOSIS — Z811 Family history of alcohol abuse and dependence: Secondary | ICD-10-CM | POA: Diagnosis not present

## 2016-12-24 DIAGNOSIS — Z87891 Personal history of nicotine dependence: Secondary | ICD-10-CM | POA: Diagnosis not present

## 2016-12-24 DIAGNOSIS — I739 Peripheral vascular disease, unspecified: Secondary | ICD-10-CM | POA: Insufficient documentation

## 2016-12-24 DIAGNOSIS — G4733 Obstructive sleep apnea (adult) (pediatric): Secondary | ICD-10-CM | POA: Diagnosis not present

## 2016-12-24 DIAGNOSIS — Z8261 Family history of arthritis: Secondary | ICD-10-CM | POA: Diagnosis not present

## 2016-12-24 DIAGNOSIS — Z818 Family history of other mental and behavioral disorders: Secondary | ICD-10-CM | POA: Insufficient documentation

## 2016-12-24 DIAGNOSIS — Z7901 Long term (current) use of anticoagulants: Secondary | ICD-10-CM | POA: Diagnosis not present

## 2016-12-24 DIAGNOSIS — R002 Palpitations: Secondary | ICD-10-CM | POA: Insufficient documentation

## 2016-12-24 DIAGNOSIS — Z9889 Other specified postprocedural states: Secondary | ICD-10-CM | POA: Insufficient documentation

## 2016-12-24 DIAGNOSIS — I4891 Unspecified atrial fibrillation: Secondary | ICD-10-CM

## 2016-12-24 DIAGNOSIS — Z888 Allergy status to other drugs, medicaments and biological substances status: Secondary | ICD-10-CM | POA: Diagnosis not present

## 2016-12-24 DIAGNOSIS — Z833 Family history of diabetes mellitus: Secondary | ICD-10-CM | POA: Diagnosis not present

## 2016-12-24 DIAGNOSIS — Z8249 Family history of ischemic heart disease and other diseases of the circulatory system: Secondary | ICD-10-CM | POA: Diagnosis not present

## 2016-12-24 MED ORDER — METOPROLOL TARTRATE 25 MG PO TABS: ORAL_TABLET | ORAL | 3 refills | Status: DC

## 2016-12-24 NOTE — Patient Instructions (Addendum)
Increase Metoprolol to 25 mg in the morning and continue 12.5 mg in the evening.    I have called to schedule a cardiac event monitor.  We are waiting to hear availability at the [address] office.

## 2016-12-24 NOTE — Progress Notes (Signed)
Patient ID: Lance [name], male   DOB: 1980/09/17, 36 y.o.   MRN: [id number]    Primary Care Physician: Lance Garter, MD Referring Physician:[facility] f/u EP: Lance. Johney [name] Cardiologist: Lance. Arvilla [name] Lance [name] is a 36 y.o. male with a h/o  h/o persistent afib and prior tachy mediated cardiomyopathy. He has had prior ablation  in 2015. Previously on Xarelto and amiodarone. He was admitted 12/2615 thru 01/06/16 with acute on chronic heart failure, afib with rvr. He was restarted on amiodarone load and xarelto for a chadsvasc score of 1.Shortness of breath present since the fourth of July and weight increased.EF on this hospitalization 25-30% thought secondary to Lance [name]. Has failed tikosyn in the past.  He is in the afib clinic, 7/19, for c/o of palpitations over the last couple of weeks in the afternoon after he eats lunch.He says that hr will increase to over 100. He is trying to be careful  with what he eats and has lost 6 lbs over the last couple of months.He usually stays between 295-298 lbs in the am's at home.He was taken off amiodarone last April.He is suppose to pick up BiPAP this Monday. He is pending appointment with Lance [name] in August. He is trying to watch caffeine intake and very little alcohol as he has found this is a trigger. Last echo showed improved of EF at 50-55%. NSR on EKG today.  Today, he denies symptoms of  chest pain, shortness of breath, orthopnea, PND, lower extremity edema, dizziness, presyncope, syncope, or neurologic sequela. The patient is tolerating medications without difficulties and is otherwise without complaint today.   Past Medical History:  Diagnosis Date  . Acute systolic heart failure (HCC) 09/14/2012  . Atrial flutter [facility])    s/p ablation by Lance [name]  . Medical history non-contributory   . Neuromuscular disorder (HCC)   . Onychomycosis   . OSA on CPAP   . Peripheral vascular disease (HCC)   . Persistent atrial fibrillation (HCC) 2003   Past  Surgical History:  Procedure Laterality Date  . ATRIAL FIBRILLATION ABLATION N/A 02/15/2014   Procedure: ATRIAL FIBRILLATION ABLATION;  Surgeon: Lance Rhyme, MD;  Location: [facility] CATH LAB;  Service: Cardiovascular;  Laterality: N/A;  . ATRIAL FLUTTER ABLATION  09/14/2012   by Lance [name]  . ATRIAL FLUTTER ABLATION N/A 09/14/2012   Procedure: ATRIAL FLUTTER ABLATION;  Surgeon: Lance Maw, MD;  Location: [facility] CATH LAB;  Service: Cardiovascular;  Laterality: N/A;  . CARDIAC CATHETERIZATION    . CARDIOVERSION N/A 08/14/2012   Procedure: CARDIOVERSION;  Surgeon: Lance Patty, MD;  Location: [facility] OR;  Service: Cardiovascular;  Laterality: N/A;  . CARDIOVERSION N/A 01/03/2016   Procedure: CARDIOVERSION;  Surgeon: Lance Riffle, MD;  Location: [facility] ENDOSCOPY;  Service: Cardiovascular;  Laterality: N/A;  . MANDIBLE SURGERY     underbite correction  . TEE WITHOUT CARDIOVERSION N/A 02/14/2014   Procedure: TRANSESOPHAGEAL ECHOCARDIOGRAM (TEE);  Surgeon: Lance Reichert, MD;  Location: [facility] ENDOSCOPY;  Service: Cardiovascular;  Laterality: N/A;  . TEE WITHOUT CARDIOVERSION N/A 01/03/2016   Procedure: TRANSESOPHAGEAL ECHOCARDIOGRAM (TEE);  Surgeon: Lance Riffle, MD;  Location: [name] ENDOSCOPY;  Service: Cardiovascular;  Laterality: N/A;    Current Outpatient Prescriptions  Medication Sig Dispense Refill  . famotidine (PEPCID) 20 MG tablet Take 1 tablet (20 mg total) by mouth 2 (two) times daily. 60 tablet 11  . flecainide (TAMBOCOR) 100 MG tablet Take 1 tablet (100 mg total) by mouth 2 (two)  times daily. 60 tablet 6  . furosemide (LASIX) 40 MG tablet Take 1 tablet (40 mg total) by mouth 2 (two) times daily. 60 tablet 5  . losartan (COZAAR) 25 MG tablet TAKE ONE TABLET BY MOUTH ONCE DAILY 90 tablet 2  . metoprolol tartrate (LOPRESSOR) 25 MG tablet Take one tablet in the morning and 1/2 tablet in the evening 90 tablet 3  . potassium chloride SA (K-DUR,KLOR-CON) 20 MEQ tablet Take 1 tablet (20 mEq total) by mouth 2  (two) times daily. Take with lasix 60 tablet 5  . rivaroxaban (XARELTO) 20 MG TABS tablet Take 1 tablet (20 mg total) by mouth daily with supper. 90 tablet 3  . fexofenadine (ALLEGRA) 30 MG tablet Take 30 mg by mouth daily.     No current facility-administered medications for this encounter.     Allergies  Allergen Reactions  . Amiodarone Other (See Comments)    IV intolerance with Severe hypotension, shock after amiodarone bolus.  Taking po Amiodarone without problems. Pt can tolerate IV drip but not bolus.   . Altace [Ramipril]     Cough   . Azithromycin Itching  . Protonix [Pantoprazole Sodium]     Couldn't belch  . Fish Allergy Other (See Comments)    Not shell fish - regular fish causes tingling  . Metoprolol Succinate Other (See Comments)    Body aches at high doses.  Currently takes two tablets of lopressor 25mg  twice daily; three tablets twice daily caused him to ache.    Social History   Social History  . Marital status: Single    Spouse name: N/A  . Number of children: N/A  . Years of education: N/A   Occupational History  . [employer]   Social History Main Topics  . Smoking status: Former Smoker    Years: 3.00    Types: Cigarettes, Cigars    Quit date: 05/12/2009  . Smokeless tobacco: Never Used     Comment: 4 cigars daily  . Alcohol use Yes     Comment: Glass of wine on weekends  . Drug use: Yes    Types: Marijuana     Comment: occassional use   . Sexual activity: Yes   Other Topics Concern  . Not on file   Social History Narrative  . No narrative on file    Family History  Problem Relation Age of Onset  . Diabetes Mother   . Hypertension Mother   . Varicose Veins Mother   . Depression Father   . Osteoarthritis Father   . Alcohol abuse Father     ROS- All systems are reviewed and negative except as per the HPI above  Physical Exam: Vitals:   12/24/16 1000  BP: 110/78  Pulse: 76  Weight: (!) 301 lb 12.8 oz (136.9 kg)    Height: 6\' 2"  (1.88 m)    GEN- The patient is well appearing, alert and oriented x 3 today.   Head- normocephalic, atraumatic Eyes-  Sclera clear, conjunctiva pink Ears- hearing intact Oropharynx- clear Neck- supple, no JVP Lymph- no cervical lymphadenopathy Lungs- Clear to ausculation bilaterally, normal work of breathing Heart- Regular rate and rhythm, no murmurs, rubs or gallops, PMI not laterally displaced GI- soft, NT, ND, + BS Extremities- no clubbing, cyanosis, or edema MS- no significant deformity or atrophy Skin- no rash or lesion Psych- euthymic mood, full affect Neuro- strength and sensation are intact  EKG-NSR at 76 bpm, pr int 144 ms, qrs int  106 ms, qtc 450 ms Epic records reviewed  Assessment and Plan:  1.Persistent afib In SR Increase in HR in the pm's, ? afib Off amiodarone since  spring On flecainide 100 mg bid Is feeling palpitations in the pm, thinks lunch is a trigger, denies over eating at this meal. Is trying to eat small portions Increase metoprolol to 25 mg am and keep 12. 5 mg at hs Event monitor Continue xarelto for chadsvasc score of 1( lv dysfunction). Congratulated on weight loss efforts  2. Acute on chronic heart failure, TMC Continue lasix Daily weights, fluid status stable Continue BB Avoid salt  3. OSA He is going to pick up and start using Bipap this Monday Will schedule event monitor to start a few days after this starts  Has appointment with Lance. [name] 8/7 but will push out  several weeks to allow for event monitor to finish Afib clinic as needed   Lupita Leash [facility] [address] [place], [place] [address] [phone]

## 2016-12-25 ENCOUNTER — Encounter (HOSPITAL_COMMUNITY): Payer: BLUE CROSS/BLUE SHIELD

## 2016-12-28 DIAGNOSIS — G4733 Obstructive sleep apnea (adult) (pediatric): Secondary | ICD-10-CM | POA: Diagnosis not present

## 2016-12-29 ENCOUNTER — Ambulatory Visit (INDEPENDENT_AMBULATORY_CARE_PROVIDER_SITE_OTHER): Payer: BLUE CROSS/BLUE SHIELD

## 2016-12-29 DIAGNOSIS — I4891 Unspecified atrial fibrillation: Secondary | ICD-10-CM | POA: Diagnosis not present

## 2016-12-30 DIAGNOSIS — I4891 Unspecified atrial fibrillation: Secondary | ICD-10-CM | POA: Diagnosis not present

## 2017-01-01 ENCOUNTER — Telehealth (HOSPITAL_COMMUNITY): Payer: Self-pay | Admitting: Vascular Surgery

## 2017-01-01 NOTE — Telephone Encounter (Signed)
Left pt message to change apt time due to DB not int the office that afternoon

## 2017-01-11 ENCOUNTER — Telehealth: Payer: Self-pay | Admitting: *Deleted

## 2017-01-11 NOTE — Telephone Encounter (Signed)
Received [employer] event monitor report from 01/09/17 3:50 pm, atrial fib; HR 100-110.  No symptoms were reported.  This was auto triggered event.  Monitor ordered by Rudi Coco, NP.  Pt has follow up scheduled with Dr. [name] on 9/20.  He is taking Xarelto, flecainide and his metoprolol was increased at last OV.  Monitor report reviewed and signed by Dr. Johney Frame.

## 2017-01-11 NOTE — Telephone Encounter (Signed)
Per Rudi Coco, NP  Monitor strips are not needed, patient has known afib, we are trying to establish burden.

## 2017-01-12 ENCOUNTER — Encounter (HOSPITAL_COMMUNITY): Payer: BLUE CROSS/BLUE SHIELD

## 2017-01-20 ENCOUNTER — Other Ambulatory Visit: Payer: Self-pay | Admitting: Physician Assistant

## 2017-01-20 ENCOUNTER — Other Ambulatory Visit (HOSPITAL_COMMUNITY): Payer: Self-pay | Admitting: Internal Medicine

## 2017-01-21 NOTE — Telephone Encounter (Signed)
Refill Request.  

## 2017-01-27 ENCOUNTER — Other Ambulatory Visit (HOSPITAL_COMMUNITY): Payer: Self-pay | Admitting: *Deleted

## 2017-01-27 ENCOUNTER — Encounter (HOSPITAL_COMMUNITY): Payer: Self-pay

## 2017-01-27 MED ORDER — FAMOTIDINE 20 MG PO TABS: 20.0000 mg | ORAL_TABLET | Freq: Two times a day (BID) | ORAL | 11 refills | Status: DC

## 2017-01-27 MED ORDER — RIVAROXABAN 20 MG PO TABS: 20.0000 mg | ORAL_TABLET | Freq: Every day | ORAL | 11 refills | Status: DC

## 2017-01-28 DIAGNOSIS — G4733 Obstructive sleep apnea (adult) (pediatric): Secondary | ICD-10-CM | POA: Diagnosis not present

## 2017-02-11 ENCOUNTER — Encounter (HOSPITAL_COMMUNITY): Payer: Self-pay | Admitting: *Deleted

## 2017-02-11 ENCOUNTER — Encounter (HOSPITAL_COMMUNITY): Payer: Self-pay | Admitting: Internal Medicine

## 2017-02-11 NOTE — Progress Notes (Signed)
Received FMLA forms from [employer] for patient on 01/06/2017.  Forms completed/signed and faxed to [phone] on 02/10/2017.  Original forms will be scanned to patient's electronic medical record.

## 2017-02-12 ENCOUNTER — Encounter: Payer: Self-pay | Admitting: Internal Medicine

## 2017-02-15 ENCOUNTER — Encounter: Payer: Self-pay | Admitting: Internal Medicine

## 2017-02-15 DIAGNOSIS — Z0000 Encounter for general adult medical examination without abnormal findings: Secondary | ICD-10-CM

## 2017-02-17 ENCOUNTER — Telehealth: Payer: Self-pay | Admitting: *Deleted

## 2017-02-17 ENCOUNTER — Encounter: Payer: Self-pay | Admitting: Internal Medicine

## 2017-02-17 ENCOUNTER — Ambulatory Visit (INDEPENDENT_AMBULATORY_CARE_PROVIDER_SITE_OTHER): Payer: BLUE CROSS/BLUE SHIELD | Admitting: Internal Medicine

## 2017-02-17 ENCOUNTER — Other Ambulatory Visit (INDEPENDENT_AMBULATORY_CARE_PROVIDER_SITE_OTHER): Payer: BLUE CROSS/BLUE SHIELD

## 2017-02-17 VITALS — BP 118/78 | HR 114 | Temp 98.1°F | Ht 74.0 in | Wt 303.0 lb

## 2017-02-17 DIAGNOSIS — Z23 Encounter for immunization: Secondary | ICD-10-CM | POA: Diagnosis not present

## 2017-02-17 DIAGNOSIS — Z0000 Encounter for general adult medical examination without abnormal findings: Secondary | ICD-10-CM

## 2017-02-17 DIAGNOSIS — I5022 Chronic systolic (congestive) heart failure: Secondary | ICD-10-CM | POA: Diagnosis not present

## 2017-02-17 LAB — LIPID PANEL
CHOLESTEROL: 145 mg/dL (ref 0–200)
HDL: 44.9 mg/dL (ref >39.00)
LDL Cholesterol: 79 mg/dL (ref 0–99)
NonHDL: 99.68
TRIGLYCERIDES: 102 mg/dL (ref 0.0–149.0)
Total CHOL/HDL Ratio: 3
VLDL: 20.4 mg/dL (ref 0.0–40.0)

## 2017-02-17 LAB — CBC WITH DIFFERENTIAL/PLATELET
BASOS ABS: 0 10*3/uL (ref 0.0–0.1)
Basophils Relative: 0.6 % (ref 0.0–3.0)
EOS ABS: 0.1 10*3/uL (ref 0.0–0.7)
Eosinophils Relative: 1.6 % (ref 0.0–5.0)
HCT: 43.5 % (ref 39.0–52.0)
Hemoglobin: 15 g/dL (ref 13.0–17.0)
LYMPHS PCT: 27.9 % (ref 12.0–46.0)
Lymphs Abs: 1.7 10*3/uL (ref 0.7–4.0)
MCHC: 34.4 g/dL (ref 30.0–36.0)
MCV: 90.4 fl (ref 78.0–100.0)
MONO ABS: 0.7 10*3/uL (ref 0.1–1.0)
Monocytes Relative: 11.1 % (ref 3.0–12.0)
Neutro Abs: 3.7 10*3/uL (ref 1.4–7.7)
Neutrophils Relative %: 58.8 % (ref 43.0–77.0)
Platelets: 240 10*3/uL (ref 150.0–400.0)
RBC: 4.81 Mil/uL (ref 4.22–5.81)
RDW: 13.6 % (ref 11.5–15.5)
WBC: 6.2 10*3/uL (ref 4.0–10.5)

## 2017-02-17 LAB — HEPATIC FUNCTION PANEL
ALBUMIN: 4.3 g/dL (ref 3.5–5.2)
ALT: 21 U/L (ref 0–53)
AST: 19 U/L (ref 0–37)
Alkaline Phosphatase: 49 U/L (ref 39–117)
Bilirubin, Direct: 0.1 mg/dL (ref 0.0–0.3)
TOTAL PROTEIN: 7.1 g/dL (ref 6.0–8.3)
Total Bilirubin: 0.5 mg/dL (ref 0.2–1.2)

## 2017-02-17 LAB — BASIC METABOLIC PANEL
BUN: 16 mg/dL (ref 6–23)
CALCIUM: 9.8 mg/dL (ref 8.4–10.5)
CO2: 24 meq/L (ref 19–32)
CREATININE: 1.11 mg/dL (ref 0.40–1.50)
Chloride: 105 mEq/L (ref 96–112)
GFR: 95.46 mL/min (ref >60.00)
GLUCOSE: 98 mg/dL (ref 70–99)
Potassium: 3.8 mEq/L (ref 3.5–5.1)
Sodium: 139 mEq/L (ref 135–145)

## 2017-02-17 LAB — TSH: TSH: 2.5 u[IU]/mL (ref 0.35–4.50)

## 2017-02-17 MED ORDER — VASCULERA PO TABS: 1.0000 | ORAL_TABLET | Freq: Every day | ORAL | 3 refills | Status: DC

## 2017-02-17 MED ORDER — METOPROLOL TARTRATE 25 MG PO TABS: 25.0000 mg | ORAL_TABLET | Freq: Two times a day (BID) | ORAL | 3 refills | Status: DC

## 2017-02-17 MED ORDER — TRIAMCINOLONE ACETONIDE 0.1 % EX OINT: 1.0000 "application " | TOPICAL_OINTMENT | Freq: Two times a day (BID) | CUTANEOUS | 2 refills | Status: DC

## 2017-02-17 NOTE — Assessment & Plan Note (Signed)
We discussed age appropriate health related issues, including available/recomended screening tests and vaccinations. We discussed a need for adhering to healthy diet and exercise. Labs were ordered to be later reviewed . All questions were answered.   

## 2017-02-17 NOTE — Progress Notes (Signed)
Subjective:  Patient ID: Lance [name], male    DOB: 1980-10-20  Age: 36 y.o. MRN: [id number]  CC: No chief complaint on file.   HPI Lance [name] presents for a well exam  Outpatient Medications Prior to Visit  Medication Sig Dispense Refill  . famotidine (PEPCID) 20 MG tablet Take 1 tablet (20 mg total) by mouth 2 (two) times daily. 60 tablet 11  . fexofenadine (ALLEGRA) 30 MG tablet Take 30 mg by mouth daily.    . flecainide (TAMBOCOR) 100 MG tablet Take 1 tablet (100 mg total) by mouth 2 (two) times daily. 60 tablet 6  . furosemide (LASIX) 40 MG tablet TAKE 1 TABLET BY MOUTH TWICE DAILY 180 tablet 1  . losartan (COZAAR) 25 MG tablet TAKE ONE TABLET BY MOUTH ONCE DAILY 90 tablet 2  . metoprolol tartrate (LOPRESSOR) 25 MG tablet Take one tablet in the morning and 1/2 tablet in the evening (Patient taking differently: Take 25 mg by mouth 2 (two) times daily. ) 90 tablet 3  . potassium chloride SA (K-DUR,KLOR-CON) 20 MEQ tablet Take 1 tablet (20 mEq total) by mouth 2 (two) times daily. Take with lasix 60 tablet 5  . rivaroxaban (XARELTO) 20 MG TABS tablet Take 1 tablet (20 mg total) by mouth daily with supper. 30 tablet 11   No facility-administered medications prior to visit.     ROS Review of Systems  Constitutional: Positive for unexpected weight change. Negative for appetite change and fatigue.  HENT: Negative for congestion, nosebleeds, sneezing, sore throat and trouble swallowing.   Eyes: Negative for itching and visual disturbance.  Respiratory: Negative for cough.   Cardiovascular: Positive for palpitations. Negative for chest pain and leg swelling.  Gastrointestinal: Negative for abdominal distention, blood in stool, diarrhea and nausea.  Genitourinary: Negative for frequency and hematuria.  Musculoskeletal: Negative for back pain, gait problem, joint swelling and neck pain.  Skin: Negative for rash.  Neurological: Negative for dizziness, tremors, speech difficulty and  weakness.  Psychiatric/Behavioral: Negative for agitation, dysphoric mood and sleep disturbance. The patient is not nervous/anxious.     Objective:  BP 118/78 (BP Location: Left Arm, Patient Position: Sitting, Cuff Size: Large)   Pulse (!) 114   Temp 98.1 F (36.7 C) (Oral)   Ht  (1.88 m)   Wt (!) 303 lb (137.4 kg)   SpO2 98%   BMI 38.90 kg/m   BP Readings from Last 3 Encounters:  02/17/17 118/78  12/24/16 110/78  09/18/16 116/74    Wt Readings from Last 3 Encounters:  02/17/17 (!) 303 lb (137.4 kg)  12/24/16 (!) 301 lb 12.8 oz (136.9 kg)  09/18/16 (!) 307 lb (139.3 kg)    Physical Exam  Constitutional: He is oriented to person, place, and time. He appears well-developed. No distress.  NAD  HENT:  Mouth/Throat: Oropharynx is clear and moist.  Eyes: Pupils are equal, round, and reactive to light. Conjunctivae are normal.  Neck: Normal range of motion. No JVD present. No thyromegaly present.  Cardiovascular: Normal rate, regular rhythm, normal heart sounds and intact distal pulses.  Exam reveals no gallop and no friction rub.   No murmur heard. Pulmonary/Chest: Effort normal and breath sounds normal. No respiratory distress. He has no wheezes. He has no rales. He exhibits no tenderness.  Abdominal: Soft. Bowel sounds are normal. He exhibits no distension and no mass. There is no tenderness. There is no rebound and no guarding.  Musculoskeletal: Normal range of motion. He exhibits  no edema or tenderness.  Lymphadenopathy:    He has no cervical adenopathy.  Neurological: He is alert and oriented to person, place, and time. He has normal reflexes. No cranial nerve deficit. He exhibits normal muscle tone. He displays a negative Romberg sign. Coordination and gait normal.  Skin: Skin is warm and dry. No rash noted.  Psychiatric: He has a normal mood and affect. His behavior is normal. Judgment and thought content normal.    Lab Results  Component Value Date   WBC 8.2  01/01/2016   HGB 15.0 01/01/2016   HCT 44.3 01/01/2016   PLT 228 01/01/2016   GLUCOSE 91 01/06/2016   CHOL 149 02/08/2015   TRIG 62.0 02/08/2015   HDL 42.30 02/08/2015   LDLCALC 95 02/08/2015   ALT 31 01/01/2016   AST 30 01/01/2016   NA 139 01/06/2016   K 4.1 01/06/2016   CL 100 (L) 01/06/2016   CREATININE 1.19 01/06/2016   BUN 9 01/06/2016   CO2 31 01/06/2016   TSH 1.999 01/01/2016   PSA 0.45 02/01/2013   INR 1.07 01/01/2016    No results found.  Assessment & Plan:   There are no diagnoses linked to this encounter. I am having Lance [name] maintain his fexofenadine, potassium chloride SA, flecainide, losartan, metoprolol tartrate, furosemide, famotidine, and rivaroxaban.  No orders of the defined types were placed in this encounter.    Follow-up: No Follow-up on file.  Sonda Primes, MD

## 2017-02-17 NOTE — Telephone Encounter (Signed)
Received call from pharmacist Syia needing to clarify directions on metoprolol. There are two instructions. Per Dr. Alain Honey should be 1 twice a day. Inform Syia a new rx has been sent as well...Raechel Chute

## 2017-02-17 NOTE — Patient Instructions (Signed)
Dr Quillian Quince    Gluten free trial (no wheat products) for 4-6 weeks. OK to use gluten-free bread and gluten-free pasta.

## 2017-02-17 NOTE — Assessment & Plan Note (Signed)
Labs

## 2017-02-17 NOTE — Assessment & Plan Note (Signed)
Vasculera option is discussed Loose wt

## 2017-02-17 NOTE — Assessment & Plan Note (Signed)
Ref to Dr Beasley discussed 

## 2017-02-18 ENCOUNTER — Other Ambulatory Visit (HOSPITAL_COMMUNITY): Payer: Self-pay | Admitting: *Deleted

## 2017-02-18 ENCOUNTER — Encounter (HOSPITAL_COMMUNITY): Payer: BLUE CROSS/BLUE SHIELD | Admitting: Internal Medicine

## 2017-02-25 ENCOUNTER — Ambulatory Visit (HOSPITAL_COMMUNITY)
Admission: RE | Admit: 2017-02-25 | Discharge: 2017-02-25 | Disposition: A | Discharge status: Home / Self Care | Payer: BLUE CROSS/BLUE SHIELD | Source: Ambulatory Visit | Attending: Internal Medicine | Admitting: Internal Medicine

## 2017-02-25 VITALS — BP 100/74 | HR 119 | Wt 303.5 lb

## 2017-02-25 DIAGNOSIS — Z7901 Long term (current) use of anticoagulants: Secondary | ICD-10-CM | POA: Insufficient documentation

## 2017-02-25 DIAGNOSIS — I739 Peripheral vascular disease, unspecified: Secondary | ICD-10-CM | POA: Diagnosis not present

## 2017-02-25 DIAGNOSIS — G4733 Obstructive sleep apnea (adult) (pediatric): Secondary | ICD-10-CM | POA: Diagnosis not present

## 2017-02-25 DIAGNOSIS — Z6838 Body mass index (BMI) 38.0-38.9, adult: Secondary | ICD-10-CM | POA: Diagnosis not present

## 2017-02-25 DIAGNOSIS — I4891 Unspecified atrial fibrillation: Secondary | ICD-10-CM | POA: Diagnosis not present

## 2017-02-25 DIAGNOSIS — Z79899 Other long term (current) drug therapy: Secondary | ICD-10-CM | POA: Diagnosis not present

## 2017-02-25 DIAGNOSIS — I4892 Unspecified atrial flutter: Secondary | ICD-10-CM | POA: Diagnosis not present

## 2017-02-25 DIAGNOSIS — I480 Paroxysmal atrial fibrillation: Secondary | ICD-10-CM | POA: Insufficient documentation

## 2017-02-25 DIAGNOSIS — I110 Hypertensive heart disease with heart failure: Secondary | ICD-10-CM | POA: Insufficient documentation

## 2017-02-25 DIAGNOSIS — I5022 Chronic systolic (congestive) heart failure: Secondary | ICD-10-CM | POA: Insufficient documentation

## 2017-02-25 MED ORDER — AMIODARONE HCL 200 MG PO TABS: 400.0000 mg | ORAL_TABLET | Freq: Two times a day (BID) | ORAL | 30 refills | Status: DC

## 2017-02-25 NOTE — Progress Notes (Signed)
Patient ID: JASHAD GANUS, male   DOB: 04-30-1981, 36 y.o.   MRN: [id number]  EP: Dr Johney Frame  HPI: Mr. Schmeichel is a 36 y.o. AAM with PMH significant for PAF, HTN, OSA, and Chronic Systolic Heart Failure     Admitted 08/2012 and found to be in Afib with RVR. He was started on amiodarone and became profoundly hypotensive requiring levophed and IVF.  He underwent TEE and failed DCCV on 3/9.  Dr. [name] placed him on Tikosyn and he had polymorphic VT.  Transitioned to oral amiodarone with good rate response.  Echo during admission showed EF 10% with diffuse hypokinesis.  He was diuresed and discharged on amiodarone, xarelto, digoxin, ramipril, lopressor and lasix.    09/14/12 S/P A-Flutter ablation. EF subsequently returned to normal.   Readmitted in July 2017 with HF and found to have recurrent AF with RVR. EF back down to 25-30%. Underwent attempt at [facility] on 01/03/16. Maintained NSR for 2 mintues and then back to AF. He was placed on IV amiodarone with close monitoring of BP along with oral amiodarone for faster loading. He eventually converted back to NSR on IV amiodarone. D/c weight 300 pounds.   Today he returns for HF follow up. Last visit EF was 50-55%. Amio stopped and flecainide was started. Over the last month heart rate has been up a bit but still in 70s. He has missed a couple doses of bb. On Monday he noticed fatigue and and tachypalpitations with HR > 100. Tolerating CPAP. Taking all medications.   Echo 10/17 EF 35-40% ECHO 08/2016 EF 50-55%.   ROS: All systems negative except as listed in HPI, PMH and Problem List.  Past Medical History:  Diagnosis Date  . Acute systolic heart failure (HCC) 09/14/2012  . Atrial flutter [facility])    s/p ablation by Dr [name]  . Medical history non-contributory   . Neuromuscular disorder (HCC)   . Onychomycosis   . OSA on CPAP   . Peripheral vascular disease (HCC)   . Persistent atrial fibrillation (HCC) 2003    Current Outpatient Prescriptions    Medication Sig Dispense Refill  . Dietary Management Product (VASCULERA) TABS Take 1 tablet by mouth daily. 90 tablet 3  . famotidine (PEPCID) 20 MG tablet Take 1 tablet (20 mg total) by mouth 2 (two) times daily. 60 tablet 11  . fexofenadine (ALLEGRA) 30 MG tablet Take 30 mg by mouth daily.    . flecainide (TAMBOCOR) 100 MG tablet Take 1 tablet (100 mg total) by mouth 2 (two) times daily. 60 tablet 6  . furosemide (LASIX) 40 MG tablet TAKE 1 TABLET BY MOUTH TWICE DAILY 180 tablet 1  . losartan (COZAAR) 25 MG tablet TAKE ONE TABLET BY MOUTH ONCE DAILY 90 tablet 2  . metoprolol tartrate (LOPRESSOR) 25 MG tablet Take 1 tablet (25 mg total) by mouth 2 (two) times daily. 60 tablet 3  . potassium chloride SA (K-DUR,KLOR-CON) 20 MEQ tablet Take 1 tablet (20 mEq total) by mouth 2 (two) times daily. Take with lasix 60 tablet 5  . rivaroxaban (XARELTO) 20 MG TABS tablet Take 1 tablet (20 mg total) by mouth daily with supper. 30 tablet 11  . triamcinolone ointment (KENALOG) 0.1 % Apply 1 application topically 2 (two) times daily. 80 g 2   No current facility-administered medications for this encounter.      PHYSICAL EXAM: Vitals:   02/25/17 1420  BP: 100/74  Pulse: (!) 119  SpO2: 97%  Weight: Marland Kitchen)  303 lb 8 oz (137.7 kg)   Filed Weights   02/25/17 1420  Weight: (!) 303 lb 8 oz (137.7 kg)   General:  Well appearing. No resp difficulty HEENT: normal Neck: supple. no JVD. Carotids 2+ bilat; no bruits. No lymphadenopathy or thryomegaly appreciated. Cor: PMI nondisplaced. Tachy regular No rubs, gallops or murmurs. Lungs: clear Abdomen: obese,  soft, nontender, nondistended. No hepatosplenomegaly. No bruits or masses. Good bowel sounds. Extremities: no cyanosis, clubbing, rash, edema Neuro: alert & orientedx3, cranial nerves grossly intact. moves all 4 extremities w/o difficulty. Affect pleasant   EKG : A trial Tach/atypical flutter 110 bpm   ASSESSMENT & PLAN: 1. PAF/Atrial flutter  -s/p  AFL ablation 09/2012 - s/p AF ablation 9/15  -s/p DC-CV 7/17 Today he is in Atypical A flutter. Will need to start amio 400 mg twice a day. He converted on amio 400 mg twice a day in past..  Intolerant tikosyn due to VT. Stop flecainide.    -ChadsVAsc = 2 (HF, HTN)   - Continue Xarelto.  2. 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   - Continue b-blocker. -Continue losartan 25 mg daily  3. OSA   --Started CPAP. Continue nightly.  4. Morbid obesity  Follow up in next week. Plan to repeat ECHO. Will need EKG at that time. Discussed with EP. Plan follow up with EP.   Amy Clegg NP-C  2:27 PM    Patient seen and examined with Tonye Becket, NP. We discussed all aspects of the encounter. I agree with the assessment and plan as stated above.   Initially rhythm unclear. We performed carotid massage and vagal maneuvers in room with Gypsy Balsam NP-C from EP presen with continuous ECG monitoring. Rhythm appears to be atypical AFL versus atrial tach. He has failed flecainide. Options discussed with Dr. Johney Frame. Will stop flecainide. Restart amio 400 bid. Continue Xarelto. Will see him back next week. If still in AFL will plan DC-CV. Despite AFL, volume status ok. Will repeat echo to make sure EF not dropping again. Continue CPAP. He is still reluctant about repeat ablation.   Total time spent 45 minutes. Over half that time spent discussing above.   [name], MD  5:11 PM     .

## 2017-02-25 NOTE — Patient Instructions (Signed)
Start Amiodarone 400 mg (2 tabs), twice a day   Your physician has requested that you have an echocardiogram. Echocardiography is a painless test that uses sound waves to create images of your heart. It provides your doctor with information about the size and shape of your heart and how well your heart's chambers and valves are working. This procedure takes approximately one hour. There are no restrictions for this procedure.  Your physician recommends that you schedule a follow-up appointment in: 9/26 with Amy NP

## 2017-02-28 DIAGNOSIS — G4733 Obstructive sleep apnea (adult) (pediatric): Secondary | ICD-10-CM | POA: Diagnosis not present

## 2017-03-03 ENCOUNTER — Ambulatory Visit (HOSPITAL_COMMUNITY)
Admission: RE | Admit: 2017-03-03 | Discharge: 2017-03-03 | Disposition: A | Discharge status: Home / Self Care | Payer: BLUE CROSS/BLUE SHIELD | Source: Ambulatory Visit | Attending: Internal Medicine | Admitting: Internal Medicine

## 2017-03-03 ENCOUNTER — Encounter (HOSPITAL_COMMUNITY): Payer: Self-pay

## 2017-03-03 ENCOUNTER — Ambulatory Visit (HOSPITAL_BASED_OUTPATIENT_CLINIC_OR_DEPARTMENT_OTHER)
Admission: RE | Admit: 2017-03-03 | Discharge: 2017-03-03 | Disposition: A | Discharge status: Home / Self Care | Payer: BLUE CROSS/BLUE SHIELD | Source: Ambulatory Visit | Attending: Internal Medicine | Admitting: Internal Medicine

## 2017-03-03 VITALS — BP 122/82 | HR 91 | Wt 307.4 lb

## 2017-03-03 DIAGNOSIS — I739 Peripheral vascular disease, unspecified: Secondary | ICD-10-CM | POA: Diagnosis not present

## 2017-03-03 DIAGNOSIS — I5022 Chronic systolic (congestive) heart failure: Secondary | ICD-10-CM | POA: Insufficient documentation

## 2017-03-03 DIAGNOSIS — Z79899 Other long term (current) drug therapy: Secondary | ICD-10-CM | POA: Insufficient documentation

## 2017-03-03 DIAGNOSIS — I480 Paroxysmal atrial fibrillation: Secondary | ICD-10-CM | POA: Diagnosis not present

## 2017-03-03 DIAGNOSIS — I5032 Chronic diastolic (congestive) heart failure: Secondary | ICD-10-CM

## 2017-03-03 DIAGNOSIS — I4892 Unspecified atrial flutter: Secondary | ICD-10-CM | POA: Insufficient documentation

## 2017-03-03 DIAGNOSIS — G4733 Obstructive sleep apnea (adult) (pediatric): Secondary | ICD-10-CM | POA: Insufficient documentation

## 2017-03-03 DIAGNOSIS — Z9889 Other specified postprocedural states: Secondary | ICD-10-CM | POA: Insufficient documentation

## 2017-03-03 DIAGNOSIS — I110 Hypertensive heart disease with heart failure: Secondary | ICD-10-CM | POA: Insufficient documentation

## 2017-03-03 DIAGNOSIS — Z7901 Long term (current) use of anticoagulants: Secondary | ICD-10-CM | POA: Insufficient documentation

## 2017-03-03 NOTE — Patient Instructions (Addendum)
You have been scheduled for a cardioversion. Please see attached instruction sheet for additional details.  Follow up 4 weeks with Dr. [name].  _________________________________________________________________  _________________________________________________________________  Take all medication as prescribed the day of your appointment. Bring all medications with you to your appointment.  Do the following things EVERYDAY: 1) Weigh yourself in the morning before breakfast. Write it down and keep it in a log. 2) Take your medicines as prescribed 3) Eat low salt foods-Limit salt (sodium) to 2000 mg per day.  4) Stay as active as you can everyday 5) Limit all fluids for the day to less than 2 liters

## 2017-03-03 NOTE — Progress Notes (Signed)
Patient ID: Lance [name], male   DOB: [birth year], 36 y.o.   MRN: [id number]  EP: Dr Johney Frame  HPI: Lance [name] is a 36 y.o. AAM with PMH significant for PAF, HTN, OSA, and Chronic Systolic Heart Failure     Admitted 08/2012 and found to be in Afib with RVR. He was started on amiodarone and became profoundly hypotensive requiring levophed and IVF.  He underwent TEE and failed DCCV on 3/9.  Dr. [name] placed him on Tikosyn and he had polymorphic VT.  Transitioned to oral amiodarone with good rate response.  Echo during admission showed EF 10% with diffuse hypokinesis.  He was diuresed and discharged on amiodarone, xarelto, digoxin, ramipril, lopressor and lasix.    09/14/12 S/P A-Flutter ablation. EF subsequently returned to normal.   Readmitted in July 2017 with HF and found to have recurrent AF with RVR. EF back down to 25-30%. Underwent attempt at [facility] on 01/03/16. Maintained NSR for 2 mintues and then back to AF. He was placed on IV amiodarone with close monitoring of BP along with oral amiodarone for faster loading. He eventually converted back to NSR on IV amiodarone. D/c weight 300 pounds.   Today he returns for HF follow up. Last visit he was back in A fib RVR so amio 400 mg twice a day was started. Today he is feeling better but still having some tachycardia. Denies SOB/PND/Orthopnea. Taking all medications. He has not missed any medications. No bleeding problems.   Echo 10/17 EF 35-40% ECHO 08/2016 EF 50-55%. ECHO today 03/03/2017 EF 50-55%   ROS: All systems negative except as listed in HPI, PMH and Problem List.  Past Medical History:  Diagnosis Date  . Acute systolic heart failure (HCC) 09/14/2012  . Atrial flutter [facility])    s/p ablation by Dr [name]  . Medical history non-contributory   . Neuromuscular disorder (HCC)   . Onychomycosis   . OSA on CPAP   . Peripheral vascular disease (HCC)   . Persistent atrial fibrillation (HCC) 2003    Current Outpatient Prescriptions    Medication Sig Dispense Refill  . amiodarone (PACERONE) 200 MG tablet Take 2 tablets (400 mg total) by mouth 2 (two) times daily. 120 tablet 30  . Dietary Management Product (VASCULERA) TABS Take 1 tablet by mouth daily. 90 tablet 3  . famotidine (PEPCID) 20 MG tablet Take 1 tablet (20 mg total) by mouth 2 (two) times daily. 60 tablet 11  . fexofenadine (ALLEGRA) 30 MG tablet Take 30 mg by mouth daily.    . furosemide (LASIX) 40 MG tablet TAKE 1 TABLET BY MOUTH TWICE DAILY 180 tablet 1  . losartan (COZAAR) 25 MG tablet TAKE ONE TABLET BY MOUTH ONCE DAILY 90 tablet 2  . metoprolol tartrate (LOPRESSOR) 25 MG tablet Take 1 tablet (25 mg total) by mouth 2 (two) times daily. 60 tablet 3  . potassium chloride SA (K-DUR,KLOR-CON) 20 MEQ tablet Take 1 tablet (20 mEq total) by mouth 2 (two) times daily. Take with lasix 60 tablet 5  . rivaroxaban (XARELTO) 20 MG TABS tablet Take 1 tablet (20 mg total) by mouth daily with supper. 30 tablet 11  . triamcinolone ointment (KENALOG) 0.1 % Apply 1 application topically 2 (two) times daily. 80 g 2   No current facility-administered medications for this encounter.      PHYSICAL EXAM: Vitals:   03/03/17 1210  BP: 122/82  Pulse: 91  SpO2: 100%  Weight: (!) 307 lb 6.4 oz (139.4  kg)   Filed Weights   03/03/17 1210  Weight: (!) 307 lb 6.4 oz (139.4 kg)   General:  Well appearing. No resp difficulty HEENT: normal Neck: supple. no JVD. Carotids 2+ bilat; no bruits. No lymphadenopathy or thryomegaly appreciated. Cor: PMI nondisplaced. Irregular rate & rhythm. No rubs, gallops or murmurs. Lungs: clear Abdomen: obese, soft, nontender, nondistended. No hepatosplenomegaly. No bruits or masses. Good bowel sounds. Extremities: no cyanosis, clubbing, rash, edema Neuro: alert & orientedx3, cranial nerves grossly intact. moves all 4 extremities w/o difficulty. Affect pleasant  EKG:  A Fib 87 bpm  ASSESSMENT & PLAN: 1. PAF/Atrial flutter  -s/p AFL ablation  09/2012 - s/p AF ablation 9/15  -s/p DC-CV 7/17 Remains in A fib but rate is controlled. Continue amio 400 mg twice a day. Set up DC-CV. He has been compliant with xarelto. Intolerant tikosyn due to VT.   -ChadsVAsc = 2 (HF, HTN)  2. 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 Continue current HF meds.   3. OSA   --Started CPAP. Continue nightly.  4. Morbid obesity  Set up DC-CV later this week.   Follow up in 4 week.s  Amy Clegg NP-C  12:15 PM   Patient seen and examined with Tonye Becket, NP. We discussed all aspects of the encounter. I agree with the assessment and plan as stated above.   He is in AF today. (was in atypical flutter vs atrial tach last week). Rate is now controlled but still symptomatic. Will plan DC-CV next week. Continue po amio. Echo today with preserved EF. Has failed flecainide and tikosyn. Will need to f/u with EP to discuss repeat ablation. Continue Xarelto.   [name], MD  8:42 PM    .

## 2017-03-03 NOTE — Progress Notes (Signed)
  Echocardiogram 2D Echocardiogram has been performed.  Lance [name] 03/03/2017, 10:50 AM

## 2017-03-03 NOTE — Progress Notes (Signed)
DCCV INSTRUCTIONS REVIEWED WITH PATIENT

## 2017-03-07 ENCOUNTER — Encounter (HOSPITAL_COMMUNITY): Payer: Self-pay | Admitting: Anesthesiology

## 2017-03-07 NOTE — Anesthesia Preprocedure Evaluation (Addendum)
Anesthesia Evaluation  Patient identified by MRN, date of birth, ID band Patient awake    Reviewed: Allergy & Precautions, NPO status , Patient's Chart, lab work & pertinent test results  Airway Mallampati: I       Dental no notable dental hx. (+) Teeth Intact   Pulmonary former smoker,    Pulmonary exam normal breath sounds clear to auscultation       Cardiovascular + dysrhythmias Atrial Fibrillation  Rhythm:Irregular Rate:Normal     Neuro/Psych negative psych ROS   GI/Hepatic negative GI ROS, Neg liver ROS,   Endo/Other  negative endocrine ROSMorbid obesity  Renal/GU negative Renal ROS  negative genitourinary   Musculoskeletal   Abdominal (+) + obese,   Peds  Hematology negative hematology ROS (+)   Anesthesia Other Findings   Reproductive/Obstetrics                                                             Anesthesia Evaluation  Patient identified by MRN, date of birth, ID band Patient awake    Reviewed: Allergy & Precautions, H&P , NPO status , Patient's Chart, lab work & pertinent test results  Airway Mallampati: I  TM Distance: >3 FB Neck ROM: Full    Dental no notable dental hx. (+) Dental Advisory Given   Pulmonary sleep apnea and Continuous Positive Airway Pressure Ventilation , former smoker,  Sever OSA.  Better with CPAP 14cm pressure   Pulmonary exam normal        Cardiovascular Normal cardiovascular exam+ dysrhythmias Atrial Fibrillation  Rhythm:Irregular Rate:Normal  Long hx of AF.  EF has been as low as 10% when fast vent rate. Now about 50-55%, on Amiododeone   Neuro/Psych    GI/Hepatic   Endo/Other  Morbid obesity  Renal/GU      Musculoskeletal   Abdominal (+) + obese,   Peds  Hematology   Anesthesia Other Findings   Reproductive/Obstetrics                            Anesthesia Physical  Anesthesia  Plan  ASA: III  Anesthesia Plan: MAC   Post-op Pain Management:    Induction: Intravenous  Airway Management Planned: Simple Face Mask  Additional Equipment:   Intra-op Plan:   Post-operative Plan:   Informed Consent: I have reviewed the patients History and Physical, chart, labs and discussed the procedure including the risks, benefits and alternatives for the proposed anesthesia with the patient or authorized representative who has indicated his/her understanding and acceptance.     Plan Discussed with:   Anesthesia Plan Comments:         Anesthesia Quick Evaluation  Anesthesia Physical Anesthesia Plan  ASA: III  Anesthesia Plan: General   Post-op Pain Management:    Induction:   PONV Risk Score and Plan: 1 and Ondansetron and Dexamethasone  Airway Management Planned: Natural Airway and Simple Face Mask  Additional Equipment:   Intra-op Plan:   Post-operative Plan:   Informed Consent: I have reviewed the patients History and Physical, chart, labs and discussed the procedure including the risks, benefits and alternatives for the proposed anesthesia with the patient or authorized representative who has indicated his/her understanding and acceptance.     Plan Discussed with: CRNA and  Surgeon  Anesthesia Plan Comments:        Anesthesia Quick Evaluation

## 2017-03-08 ENCOUNTER — Encounter (HOSPITAL_COMMUNITY)
Admission: RE | Disposition: A | Discharge status: Home / Self Care | Payer: Self-pay | Source: Ambulatory Visit | Attending: Internal Medicine

## 2017-03-08 ENCOUNTER — Ambulatory Visit (HOSPITAL_COMMUNITY)
Admission: RE | Admit: 2017-03-08 | Discharge: 2017-03-08 | Disposition: A | Discharge status: Home / Self Care | Payer: BLUE CROSS/BLUE SHIELD | Source: Ambulatory Visit | Attending: Internal Medicine | Admitting: Internal Medicine

## 2017-03-08 ENCOUNTER — Ambulatory Visit (HOSPITAL_COMMUNITY): Payer: BLUE CROSS/BLUE SHIELD | Admitting: Anesthesiology

## 2017-03-08 ENCOUNTER — Encounter (HOSPITAL_COMMUNITY): Payer: Self-pay | Admitting: *Deleted

## 2017-03-08 DIAGNOSIS — I480 Paroxysmal atrial fibrillation: Secondary | ICD-10-CM

## 2017-03-08 DIAGNOSIS — I110 Hypertensive heart disease with heart failure: Secondary | ICD-10-CM | POA: Diagnosis not present

## 2017-03-08 DIAGNOSIS — Z6838 Body mass index (BMI) 38.0-38.9, adult: Secondary | ICD-10-CM | POA: Diagnosis not present

## 2017-03-08 DIAGNOSIS — I4892 Unspecified atrial flutter: Secondary | ICD-10-CM | POA: Diagnosis not present

## 2017-03-08 DIAGNOSIS — I4891 Unspecified atrial fibrillation: Secondary | ICD-10-CM | POA: Diagnosis not present

## 2017-03-08 DIAGNOSIS — Z7901 Long term (current) use of anticoagulants: Secondary | ICD-10-CM | POA: Insufficient documentation

## 2017-03-08 DIAGNOSIS — Z87891 Personal history of nicotine dependence: Secondary | ICD-10-CM | POA: Insufficient documentation

## 2017-03-08 DIAGNOSIS — I739 Peripheral vascular disease, unspecified: Secondary | ICD-10-CM | POA: Insufficient documentation

## 2017-03-08 DIAGNOSIS — I481 Persistent atrial fibrillation: Secondary | ICD-10-CM | POA: Insufficient documentation

## 2017-03-08 DIAGNOSIS — G4733 Obstructive sleep apnea (adult) (pediatric): Secondary | ICD-10-CM | POA: Insufficient documentation

## 2017-03-08 DIAGNOSIS — I5022 Chronic systolic (congestive) heart failure: Secondary | ICD-10-CM | POA: Diagnosis not present

## 2017-03-08 DIAGNOSIS — G709 Myoneural disorder, unspecified: Secondary | ICD-10-CM | POA: Diagnosis not present

## 2017-03-08 HISTORY — PX: CARDIOVERSION: SHX1299

## 2017-03-08 LAB — POCT I-STAT 4, (NA,K, GLUC, HGB,HCT)
GLUCOSE: 95 mg/dL (ref 65–99)
HEMATOCRIT: 41 % (ref 39.0–52.0)
HEMOGLOBIN: 13.9 g/dL (ref 13.0–17.0)
Potassium: 3.7 mmol/L (ref 3.5–5.1)
Sodium: 143 mmol/L (ref 135–145)

## 2017-03-08 SURGERY — CARDIOVERSION
Anesthesia: Monitor Anesthesia Care

## 2017-03-08 MED ORDER — LIDOCAINE HCL (CARDIAC) 20 MG/ML IV SOLN
INTRAVENOUS | Status: DC | PRN
  Administered 2017-03-08: 100 mg via INTRATRACHEAL

## 2017-03-08 MED ORDER — PROPOFOL 10 MG/ML IV BOLUS
INTRAVENOUS | Status: DC | PRN
  Administered 2017-03-08: 50 mg via INTRAVENOUS
  Administered 2017-03-08: 100 mg via INTRAVENOUS

## 2017-03-08 MED ORDER — SODIUM CHLORIDE 0.9 % IV SOLN
INTRAVENOUS | Status: DC
  Administered 2017-03-08 (×3): via INTRAVENOUS

## 2017-03-08 NOTE — CV Procedure (Signed)
     DIRECT CURRENT CARDIOVERSION  NAME:  Lance [name]   MRN: [id number] DOB:  July 09, 1980   ADMIT DATE: 03/08/2017   INDICATIONS: Atrial fibrillation    PROCEDURE:   Informed consent was obtained prior to the procedure. The risks, benefits and alternatives for the procedure were discussed and the patient comprehended these risks. Once an appropriate time out was taken, the patient had the defibrillator pads placed in the anterior and posterior position. The patient then underwent sedation by the anesthesia service. Once an appropriate level of sedation was achieved, the patient received a biphasic, synchronized 200J shock without effect. We delivered another 200J shock while holding manual pressure on the anterior pad and there was prompt conversion to sinus rhythm. No apparent complications.  [name], MD  8:22 AM

## 2017-03-08 NOTE — Anesthesia Postprocedure Evaluation (Signed)
Anesthesia Post Note  Patient: Lance [name]  Procedure(s) Performed: CARDIOVERSION (N/A )     Patient location during evaluation: Endoscopy Anesthesia Type: General Level of consciousness: awake Pain management: pain level controlled Vital Signs Assessment: post-procedure vital signs reviewed and stable Respiratory status: spontaneous breathing Cardiovascular status: stable Postop Assessment: no apparent nausea or vomiting Anesthetic complications: no    Last Vitals:  Vitals:   03/08/17 0824 03/08/17 0830  BP: 133/75 (!) 145/78  Pulse: 70 67  Resp: 16 15  Temp: 36.4 C   SpO2: 100% 100%    Last Pain:  Vitals:   03/08/17 0824  TempSrc: Oral   Pain Goal:                 Ginnette Gates JR,[name] Zenna Traister

## 2017-03-08 NOTE — Anesthesia Procedure Notes (Signed)
Date/Time: 03/08/2017 8:16 AM Performed by: Coralee Rud Pre-anesthesia Checklist: Patient identified, Emergency Drugs available, Suction available and Patient being monitored Oxygen Delivery Method: Ambu bag Preoxygenation: Pre-oxygenation with 100% oxygen Induction Type: IV induction Ventilation: Mask ventilation without difficulty Placement Confirmation: positive ETCO2

## 2017-03-08 NOTE — Transfer of Care (Signed)
Immediate Anesthesia Transfer of Care Note  Patient: Lance [name]  Procedure(s) Performed: CARDIOVERSION (N/A )  Patient Location: PACU  Anesthesia Type:General  Level of Consciousness: awake  Airway & Oxygen Therapy: Patient Spontanous Breathing and Patient connected to nasal cannula oxygen  Post-op Assessment: Report given to RN, Post -op Vital signs reviewed and stable and Patient moving all extremities  Post vital signs: Reviewed and stable  Last Vitals:  Vitals:   03/08/17 0706 03/08/17 0824  BP: 136/88 133/75  Pulse:  70  Resp: 13 16  Temp: 37.1 C   SpO2: 100% 100%    Last Pain:  Vitals:   03/08/17 0824  TempSrc: Oral         Complications: No apparent anesthesia complications

## 2017-03-08 NOTE — Interval H&P Note (Signed)
History and Physical Interval Note:  03/08/2017 8:07 AM  Lance [name]  has presented today for surgery, with the diagnosis of afib  The various methods of treatment have been discussed with the patient and family. After consideration of risks, benefits and other options for treatment, the patient has consented to  Procedure(s): CARDIOVERSION (N/A) as a surgical intervention .  The patient's history has been reviewed, patient examined, no change in status, stable for surgery.  I have reviewed the patient's chart and labs.  Questions were answered to the patient's satisfaction.     Bensimhon, Reuel Boom

## 2017-03-08 NOTE — H&P (View-Only) (Signed)
Patient ID: Lance [name], male   DOB: [birth year], 36 y.o.   MRN: [id number]  EP: Dr Johney Frame  HPI: Lance [name] is a 36 y.o. AAM with PMH significant for PAF, HTN, OSA, and Chronic Systolic Heart Failure     Admitted 08/2012 and found to be in Afib with RVR. He was started on amiodarone and became profoundly hypotensive requiring levophed and IVF.  He underwent TEE and failed DCCV on 3/9.  Dr. [name] placed him on Tikosyn and he had polymorphic VT.  Transitioned to oral amiodarone with good rate response.  Echo during admission showed EF 10% with diffuse hypokinesis.  He was diuresed and discharged on amiodarone, xarelto, digoxin, ramipril, lopressor and lasix.    09/14/12 S/P A-Flutter ablation. EF subsequently returned to normal.   Readmitted in July 2017 with HF and found to have recurrent AF with RVR. EF back down to 25-30%. Underwent attempt at [facility] on 01/03/16. Maintained NSR for 2 mintues and then back to AF. He was placed on IV amiodarone with close monitoring of BP along with oral amiodarone for faster loading. He eventually converted back to NSR on IV amiodarone. D/c weight 300 pounds.   Today he returns for HF follow up. Last visit he was back in A fib RVR so amio 400 mg twice a day was started. Today he is feeling better but still having some tachycardia. Denies SOB/PND/Orthopnea. Taking all medications. He has not missed any medications. No bleeding problems.   Echo 10/17 EF 35-40% ECHO 08/2016 EF 50-55%. ECHO today 03/03/2017 EF 50-55%   ROS: All systems negative except as listed in HPI, PMH and Problem List.  Past Medical History:  Diagnosis Date  . Acute systolic heart failure (HCC) 09/14/2012  . Atrial flutter [facility])    s/p ablation by Dr [name]  . Medical history non-contributory   . Neuromuscular disorder (HCC)   . Onychomycosis   . OSA on CPAP   . Peripheral vascular disease (HCC)   . Persistent atrial fibrillation (HCC) 2003    Current Outpatient Prescriptions    Medication Sig Dispense Refill  . amiodarone (PACERONE) 200 MG tablet Take 2 tablets (400 mg total) by mouth 2 (two) times daily. 120 tablet 30  . Dietary Management Product (VASCULERA) TABS Take 1 tablet by mouth daily. 90 tablet 3  . famotidine (PEPCID) 20 MG tablet Take 1 tablet (20 mg total) by mouth 2 (two) times daily. 60 tablet 11  . fexofenadine (ALLEGRA) 30 MG tablet Take 30 mg by mouth daily.    . furosemide (LASIX) 40 MG tablet TAKE 1 TABLET BY MOUTH TWICE DAILY 180 tablet 1  . losartan (COZAAR) 25 MG tablet TAKE ONE TABLET BY MOUTH ONCE DAILY 90 tablet 2  . metoprolol tartrate (LOPRESSOR) 25 MG tablet Take 1 tablet (25 mg total) by mouth 2 (two) times daily. 60 tablet 3  . potassium chloride SA (K-DUR,KLOR-CON) 20 MEQ tablet Take 1 tablet (20 mEq total) by mouth 2 (two) times daily. Take with lasix 60 tablet 5  . rivaroxaban (XARELTO) 20 MG TABS tablet Take 1 tablet (20 mg total) by mouth daily with supper. 30 tablet 11  . triamcinolone ointment (KENALOG) 0.1 % Apply 1 application topically 2 (two) times daily. 80 g 2   No current facility-administered medications for this encounter.      PHYSICAL EXAM: Vitals:   03/03/17 1210  BP: 122/82  Pulse: 91  SpO2: 100%  Weight: (!) 307 lb 6.4 oz (139.4  kg)   Filed Weights   03/03/17 1210  Weight: (!) 307 lb 6.4 oz (139.4 kg)   General:  Well appearing. No resp difficulty HEENT: normal Neck: supple. no JVD. Carotids 2+ bilat; no bruits. No lymphadenopathy or thryomegaly appreciated. Cor: PMI nondisplaced. Irregular rate & rhythm. No rubs, gallops or murmurs. Lungs: clear Abdomen: obese, soft, nontender, nondistended. No hepatosplenomegaly. No bruits or masses. Good bowel sounds. Extremities: no cyanosis, clubbing, rash, edema Neuro: alert & orientedx3, cranial nerves grossly intact. moves all 4 extremities w/o difficulty. Affect pleasant  EKG:  A Fib 87 bpm  ASSESSMENT & PLAN: 1. PAF/Atrial flutter  -s/p AFL ablation  09/2012 - s/p AF ablation 9/15  -s/p DC-CV 7/17 Remains in A fib but rate is controlled. Continue amio 400 mg twice a day. Set up DC-CV. He has been compliant with xarelto. Intolerant tikosyn due to VT.   -ChadsVAsc = 2 (HF, HTN)  2. 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 Continue current HF meds.   3. OSA   --Started CPAP. Continue nightly.  4. Morbid obesity  Set up DC-CV later this week.   Follow up in 4 week.s  Amy Clegg NP-C  12:15 PM   Patient seen and examined with Tonye Becket, NP. We discussed all aspects of the encounter. I agree with the assessment and plan as stated above.   He is in AF today. (was in atypical flutter vs atrial tach last week). Rate is now controlled but still symptomatic. Will plan DC-CV next week. Continue po amio. Echo today with preserved EF. Has failed flecainide and tikosyn. Will need to f/u with EP to discuss repeat ablation. Continue Xarelto.   [name], MD  8:42 PM    .

## 2017-03-08 NOTE — Discharge Instructions (Signed)
Electrical Cardioversion, Care After °This sheet gives you information about how to care for yourself after your procedure. Your health care provider may also give you more specific instructions. If you have problems or questions, contact your health care provider. °What can I expect after the procedure? °After the procedure, it is common to have: °· Some redness on the skin where the shocks were given. ° °Follow these instructions at home: °· Do not drive for 24 hours if you were given a medicine to help you relax (sedative). °· Take over-the-counter and prescription medicines only as told by your health care provider. °· Ask your health care provider how to check your pulse. Check it often. °· Rest for 48 hours after the procedure or as told by your health care provider. °· Avoid or limit your caffeine use as told by your health care provider. °Contact a health care provider if: °· You feel like your heart is beating too quickly or your pulse is not regular. °· You have a serious muscle cramp that does not go away. °Get help right away if: °· You have discomfort in your chest. °· You are dizzy or you feel faint. °· You have trouble breathing or you are short of breath. °· Your speech is slurred. °· You have trouble moving an arm or leg on one side of your body. °· Your fingers or toes turn cold or blue. °This information is not intended to replace advice given to you by your health care provider. Make sure you discuss any questions you have with your health care provider. °Document Released: 03/15/2013 Document Revised: 12/27/2015 Document Reviewed: 11/29/2015 °Elsevier Interactive Patient Education © 2018 Elsevier Inc. ° °

## 2017-03-09 ENCOUNTER — Encounter (HOSPITAL_COMMUNITY): Payer: Self-pay | Admitting: Internal Medicine

## 2017-03-10 ENCOUNTER — Ambulatory Visit (INDEPENDENT_AMBULATORY_CARE_PROVIDER_SITE_OTHER): Payer: BLUE CROSS/BLUE SHIELD | Admitting: Internal Medicine

## 2017-03-10 ENCOUNTER — Encounter: Payer: Self-pay | Admitting: Internal Medicine

## 2017-03-10 VITALS — BP 110/66 | HR 78 | Ht 74.0 in | Wt 306.8 lb

## 2017-03-10 DIAGNOSIS — I480 Paroxysmal atrial fibrillation: Secondary | ICD-10-CM

## 2017-03-10 DIAGNOSIS — I481 Persistent atrial fibrillation: Secondary | ICD-10-CM | POA: Diagnosis not present

## 2017-03-10 DIAGNOSIS — I428 Other cardiomyopathies: Secondary | ICD-10-CM | POA: Diagnosis not present

## 2017-03-10 DIAGNOSIS — I4819 Other persistent atrial fibrillation: Secondary | ICD-10-CM

## 2017-03-10 NOTE — Progress Notes (Signed)
PCP: Plotnikov, Georgina Quint, MD CHF:  Bensimhon Primary EP: Dr Tito Dine Lance [name] is a 36 y.o. male who presents today for routine electrophysiology followup.  Since last being seen in our clinic, the patient reports doing reasonably well.  He has recently required cardioversion.   He has fatigue and decreased exercise tolerance during afib.  Today, he denies symptoms of palpitations, chest pain, shortness of breath,  lower extremity edema, dizziness, presyncope, or syncope.  The patient is otherwise without complaint today.   Past Medical History:  Diagnosis Date  . Acute systolic heart failure (HCC) 09/14/2012  . Atrial flutter [facility])    s/p ablation by Dr [name]  . Medical history non-contributory   . Neuromuscular disorder (HCC)   . Onychomycosis   . OSA on CPAP   . Peripheral vascular disease (HCC)   . Persistent atrial fibrillation (HCC) 2003   Past Surgical History:  Procedure Laterality Date  . ATRIAL FIBRILLATION ABLATION N/A 02/15/2014   Procedure: ATRIAL FIBRILLATION ABLATION;  Surgeon: Gardiner Rhyme, MD;  Location: [facility] CATH LAB;  Service: Cardiovascular;  Laterality: N/A;  . ATRIAL FLUTTER ABLATION  09/14/2012   by Dr [name]  . ATRIAL FLUTTER ABLATION N/A 09/14/2012   Procedure: ATRIAL FLUTTER ABLATION;  Surgeon: Marinus Maw, MD;  Location: [facility] CATH LAB;  Service: Cardiovascular;  Laterality: N/A;  . CARDIAC CATHETERIZATION    . CARDIOVERSION N/A 08/14/2012   Procedure: CARDIOVERSION;  Surgeon: Dolores Patty, MD;  Location: [facility] OR;  Service: Cardiovascular;  Laterality: N/A;  . CARDIOVERSION N/A 01/03/2016   Procedure: CARDIOVERSION;  Surgeon: Pricilla Riffle, MD;  Location: [facility] ENDOSCOPY;  Service: Cardiovascular;  Laterality: N/A;  . CARDIOVERSION N/A 03/08/2017   Procedure: CARDIOVERSION;  Surgeon: Dolores Patty, MD;  Location: [facility] ENDOSCOPY;  Service: Cardiovascular;  Laterality: N/A;  . MANDIBLE SURGERY     underbite correction  . TEE WITHOUT CARDIOVERSION N/A  02/14/2014   Procedure: TRANSESOPHAGEAL ECHOCARDIOGRAM (TEE);  Surgeon: Quintella Reichert, MD;  Location: [facility] ENDOSCOPY;  Service: Cardiovascular;  Laterality: N/A;  . TEE WITHOUT CARDIOVERSION N/A 01/03/2016   Procedure: TRANSESOPHAGEAL ECHOCARDIOGRAM (TEE);  Surgeon: Pricilla Riffle, MD;  Location: [facility] ENDOSCOPY;  Service: Cardiovascular;  Laterality: N/A;    ROS- all systems are reviewed and negatives except as per HPI above  Current Outpatient Prescriptions  Medication Sig Dispense Refill  . amiodarone (PACERONE) 200 MG tablet Take 2 tablets (400 mg total) by mouth 2 (two) times daily. 120 tablet 30  . famotidine (PEPCID) 20 MG tablet Take 1 tablet (20 mg total) by mouth 2 (two) times daily. 60 tablet 11  . fexofenadine (ALLEGRA) 30 MG tablet Take 30 mg by mouth daily.    . furosemide (LASIX) 40 MG tablet TAKE 1 TABLET BY MOUTH TWICE DAILY 180 tablet 1  . losartan (COZAAR) 25 MG tablet TAKE ONE TABLET BY MOUTH ONCE DAILY 90 tablet 2  . metoprolol tartrate (LOPRESSOR) 25 MG tablet Take 1 tablet (25 mg total) by mouth 2 (two) times daily. 60 tablet 3  . potassium chloride SA (K-DUR,KLOR-CON) 20 MEQ tablet Take 1 tablet (20 mEq total) by mouth 2 (two) times daily. Take with lasix 60 tablet 5  . rivaroxaban (XARELTO) 20 MG TABS tablet Take 1 tablet (20 mg total) by mouth daily with supper. 30 tablet 11  . Dietary Management Product (VASCULERA) TABS Take 1 tablet by mouth daily. (Patient not taking: Reported on 03/10/2017) 90 tablet 3  . triamcinolone ointment (KENALOG)  0.1 % Apply 1 application topically 2 (two) times daily. (Patient not taking: Reported on 03/10/2017) 80 g 2   No current facility-administered medications for this visit.     Physical Exam: Vitals:   03/10/17 1643  BP: 110/66  Pulse: 78  SpO2: 97%  Weight: (!) 306 lb 12.8 oz (139.2 kg)  Height: 6\' 2"  (1.88 m)    GEN- The patient is well appearing, alert and oriented x 3 today.   Head- normocephalic, atraumatic Eyes-  Sclera  clear, conjunctiva pink Ears- hearing intact Oropharynx- clear Lungs- Clear to ausculation bilaterally, normal work of breathing Heart- Regular rate and rhythm with frequent ectopy, no murmurs, rubs or gallops, PMI not laterally displaced GI- soft, NT, ND, + BS Extremities- no clubbing, cyanosis, or edema  EKG tracing ordered today is personally reviewed and shows sinus rhythm with frequent PACs, at times in bigeminy  Assessment and Plan:  1. Persistent afib The patient has symptomatic persistent afib. Therapeutic strategies for afib including medicine and repeat ablation were discussed in detail with the patient today. Risk, benefits, and alternatives to EP study and radiofrequency ablation for afib were also discussed in detail today.  He feels that he would like to proceed with ablation but states that he will need to wait until January when he will be able to take time off from work.  We will see him back in December with plans for ablation in January. Continue anticoagulation in the interim.  2. Nonischemic CM Echo 03/03/17 (reviewed with patient) revels that EF has recovered.  He will do much better in sinus rhythm long term.  3. OSA Compliance with CPAP encouraged  4. Overweight Body mass index is 39.39 kg/m. Lifestyle modification discussed at length  Return in December with plans to schedule ablation at that time  Hillis Range MD, [facility] 03/10/2017 4:58 PM

## 2017-03-10 NOTE — Patient Instructions (Signed)
Medication Instructions:  Your physician recommends that you continue on your current medications as directed. Please refer to the Current Medication list given to you today.   Labwork: None ordered   Testing/Procedures: None ordered   Follow-Up: Your physician recommends that you schedule a follow-up appointment in: Early January to discuss Afib ablation.  Any Other Special Instructions Will Be Listed Below (If Applicable).     If you need a refill on your cardiac medications before your next appointment, please call your pharmacy.

## 2017-03-26 ENCOUNTER — Encounter (HOSPITAL_COMMUNITY): Payer: Self-pay | Admitting: *Deleted

## 2017-03-26 NOTE — Progress Notes (Signed)
Pt's disability forms completed and faxed to [facility] at [phone]

## 2017-03-29 ENCOUNTER — Inpatient Hospital Stay (HOSPITAL_COMMUNITY)
Admission: RE | Admit: 2017-03-29 | Payer: BLUE CROSS/BLUE SHIELD | Source: Ambulatory Visit | Admitting: Internal Medicine

## 2017-03-30 DIAGNOSIS — G4733 Obstructive sleep apnea (adult) (pediatric): Secondary | ICD-10-CM | POA: Diagnosis not present

## 2017-04-08 ENCOUNTER — Other Ambulatory Visit (HOSPITAL_COMMUNITY): Payer: Self-pay | Admitting: Internal Medicine

## 2017-04-12 ENCOUNTER — Other Ambulatory Visit (HOSPITAL_COMMUNITY): Payer: Self-pay | Admitting: *Deleted

## 2017-04-12 MED ORDER — POTASSIUM CHLORIDE CRYS ER 20 MEQ PO TBCR: 20.0000 meq | EXTENDED_RELEASE_TABLET | Freq: Two times a day (BID) | ORAL | 3 refills | Status: DC

## 2017-04-13 ENCOUNTER — Ambulatory Visit (HOSPITAL_COMMUNITY)
Admission: RE | Admit: 2017-04-13 | Discharge: 2017-04-13 | Disposition: A | Discharge status: Home / Self Care | Payer: BLUE CROSS/BLUE SHIELD | Source: Ambulatory Visit | Attending: Internal Medicine | Admitting: Internal Medicine

## 2017-04-13 ENCOUNTER — Encounter (HOSPITAL_COMMUNITY): Payer: Self-pay | Admitting: Internal Medicine

## 2017-04-13 VITALS — BP 112/70 | HR 64 | Wt 307.0 lb

## 2017-04-13 DIAGNOSIS — I739 Peripheral vascular disease, unspecified: Secondary | ICD-10-CM | POA: Insufficient documentation

## 2017-04-13 DIAGNOSIS — I4892 Unspecified atrial flutter: Secondary | ICD-10-CM | POA: Insufficient documentation

## 2017-04-13 DIAGNOSIS — Z79899 Other long term (current) drug therapy: Secondary | ICD-10-CM | POA: Insufficient documentation

## 2017-04-13 DIAGNOSIS — Z7901 Long term (current) use of anticoagulants: Secondary | ICD-10-CM | POA: Diagnosis not present

## 2017-04-13 DIAGNOSIS — I480 Paroxysmal atrial fibrillation: Secondary | ICD-10-CM | POA: Diagnosis not present

## 2017-04-13 DIAGNOSIS — I110 Hypertensive heart disease with heart failure: Secondary | ICD-10-CM | POA: Diagnosis not present

## 2017-04-13 DIAGNOSIS — I5022 Chronic systolic (congestive) heart failure: Secondary | ICD-10-CM

## 2017-04-13 DIAGNOSIS — G4733 Obstructive sleep apnea (adult) (pediatric): Secondary | ICD-10-CM | POA: Diagnosis not present

## 2017-04-13 MED ORDER — AMIODARONE HCL 200 MG PO TABS: 200.0000 mg | ORAL_TABLET | Freq: Two times a day (BID) | ORAL | 30 refills | Status: DC

## 2017-04-13 NOTE — Addendum Note (Signed)
Encounter addended by: Noralee Space, RN on: 04/13/2017 9:48 AM  Actions taken: Visit diagnoses modified, Diagnosis association updated, Order list changed, Sign clinical note

## 2017-04-13 NOTE — Patient Instructions (Signed)
Decrease Amiodarone to 200 mg Twice daily   We will contact you in 4 months to schedule your next appointment.

## 2017-04-13 NOTE — Progress Notes (Signed)
Patient ID: Lance [name], male   DOB: 05-Dec-1980, 36 y.o.   MRN: [id number]  EP: Dr Johney Frame  HPI: Lance [name] is a 36 y.o. AAM with PMH significant for PAF, HTN, OSA, and Chronic Systolic Heart Failure     Admitted 08/2012 and found to be in Afib with RVR. He was started on amiodarone and became profoundly hypotensive requiring levophed and IVF.  He underwent TEE and failed DCCV on 3/9.  Dr. [name] placed him on Tikosyn and he had polymorphic VT.  Transitioned to oral amiodarone with good rate response.  Echo during admission showed EF 10% with diffuse hypokinesis.  He was diuresed and discharged on amiodarone, xarelto, digoxin, ramipril, lopressor and lasix.    09/14/12 S/P A-Flutter ablation. EF subsequently returned to normal.   Readmitted in July 2017 with HF and found to have recurrent AF with RVR. EF back down to 25-30%. Underwent attempt at [facility] on 01/03/16. Maintained NSR for 2 mintues and then back to AF. He was placed on IV amiodarone with close monitoring of BP along with oral amiodarone for faster loading. He eventually converted back to NSR on IV amiodarone. D/c weight 300 pounds.   Developer recurrent AF/AFL in 9/18 and underwent DC-CV on 03/08/17. Saw Dr. Johney Frame and is planning for ablation but wants to wait until January.   Today he returns for HF follow up. Remains on amio 400 bid. Feels very good. No palpitations. Breathing is great. Work is stressful at [employer]. No orthopnea and PND. No side effects from amio.   Echo 10/17 EF 35-40% ECHO 08/2016 EF 50-55%. ECHO  03/03/2017 EF 50-55%   ROS: All systems negative except as listed in HPI, PMH and Problem List.  Past Medical History:  Diagnosis Date  . Acute systolic heart failure (HCC) 09/14/2012  . Atrial flutter [facility])    s/p ablation by Dr [name]  . Medical history non-contributory   . Neuromuscular disorder (HCC)   . Onychomycosis   . OSA on CPAP   . Peripheral vascular disease (HCC)   . Persistent atrial fibrillation  (HCC) 2003    Current Outpatient Medications  Medication Sig Dispense Refill  . amiodarone (PACERONE) 200 MG tablet Take 2 tablets (400 mg total) by mouth 2 (two) times daily. 120 tablet 30  . famotidine (PEPCID) 20 MG tablet Take 1 tablet (20 mg total) by mouth 2 (two) times daily. 60 tablet 11  . fexofenadine (ALLEGRA) 30 MG tablet Take 30 mg by mouth daily.    . furosemide (LASIX) 40 MG tablet TAKE 1 TABLET BY MOUTH TWICE DAILY 180 tablet 1  . losartan (COZAAR) 25 MG tablet TAKE ONE TABLET BY MOUTH ONCE DAILY 90 tablet 2  . metoprolol tartrate (LOPRESSOR) 25 MG tablet Take 1 tablet (25 mg total) by mouth 2 (two) times daily. 60 tablet 3  . potassium chloride SA (K-DUR,KLOR-CON) 20 MEQ tablet Take 1 tablet (20 mEq total) 2 (two) times daily by mouth. Take with lasix 180 tablet 3  . rivaroxaban (XARELTO) 20 MG TABS tablet Take 1 tablet (20 mg total) by mouth daily with supper. 30 tablet 11  . triamcinolone ointment (KENALOG) 0.1 % Apply 1 application topically 2 (two) times daily. 80 g 2   No current facility-administered medications for this encounter.      PHYSICAL EXAM: Vitals:   04/13/17 0916  BP: 112/70  Pulse: 64  SpO2: 97%  Weight: (!) 307 lb (139.3 kg)   Filed Weights   04/13/17  [id number]  Weight: (!) 307 lb (139.3 kg)   General:  Well appearing. No resp difficulty HEENT: normal Neck: supple. no JVD. Carotids 2+ bilat; no bruits. No lymphadenopathy or thryomegaly appreciated. Cor: PMI nondisplaced. Regular rate & rhythm. No rubs, gallops or murmurs. Lungs: clear Abdomen: obese, soft, nontender, nondistended. No hepatosplenomegaly. No bruits or masses. Good bowel sounds. Extremities: no cyanosis, clubbing, rash, edema Neuro: alert & orientedx3, cranial nerves grossly intact. moves all 4 extremities w/o difficulty. Affect pleasant   EKG:  NSR 60s Personally reviewed  ASSESSMENT & PLAN: 1. PAF/Atrial flutter  -s/p AFL ablation 09/2012 - s/p AF ablation 9/15  -s/p DC-CV  7/17 and 03/08/17 - In NSR on amio 400 bid. Has been seen by Dr. Johney FrameAllred. Planning for RFA in January. Will decrease amio to 200 bid.   -ChadsVAsc = 2 (HF, HTN)  2. 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 Continue current regiemn.  3. OSA   - Remains on CPAP. Continue nightly.  4. Morbid obesity - Continue weight loss efforts.    Arvilla MeresBensimhon, [place] MD 9:28 AM      .

## 2017-04-27 ENCOUNTER — Encounter (HOSPITAL_COMMUNITY): Payer: Self-pay | Admitting: *Deleted

## 2017-04-27 NOTE — Progress Notes (Signed)
Received Attending Physician's Statement form from [employer] on 04-13-2017.  Form completed/signed and faxed today to [phone].  Original form will be scanned to patient's electronic medical record.

## 2017-07-06 ENCOUNTER — Other Ambulatory Visit: Payer: Self-pay | Admitting: Internal Medicine

## 2017-07-24 ENCOUNTER — Other Ambulatory Visit (HOSPITAL_COMMUNITY): Payer: Self-pay | Admitting: Cardiology

## 2017-08-25 ENCOUNTER — Encounter: Payer: Self-pay | Admitting: Emergency Medicine

## 2017-08-25 ENCOUNTER — Encounter: Payer: Self-pay | Admitting: Internal Medicine

## 2017-08-25 ENCOUNTER — Ambulatory Visit: Payer: BLUE CROSS/BLUE SHIELD | Admitting: Internal Medicine

## 2017-08-25 VITALS — BP 100/64 | HR 69 | Temp 98.7°F | Resp 16 | Wt 290.0 lb

## 2017-08-25 DIAGNOSIS — J029 Acute pharyngitis, unspecified: Secondary | ICD-10-CM | POA: Diagnosis not present

## 2017-08-25 LAB — POCT RAPID STREP A (OFFICE): Rapid Strep A Screen: NEGATIVE

## 2017-08-25 NOTE — Assessment & Plan Note (Signed)
Rapid strep test negative Likely viral Continue otc cold meds, tylenol Rest, fluids Call if no improvement / worsening

## 2017-08-25 NOTE — Patient Instructions (Signed)
Continue the over the counter cold medications and tylenol.  Continue increased rest and fluids.  Call if no improvement or any worsening.   Pharyngitis Pharyngitis is redness, pain, and swelling (inflammation) of the throat (pharynx). It is a very common cause of sore throat. Pharyngitis can be caused by a bacteria, but it is usually caused by a virus. Most cases of pharyngitis get better on their own without treatment. What are the causes? This condition may be caused by:  Infection by viruses (viral). Viral pharyngitis spreads from person to person (is contagious) through coughing, sneezing, and sharing of personal items or utensils such as cups, forks, spoons, and toothbrushes.  Infection by bacteria (bacterial). Bacterial pharyngitis may be spread by touching the nose or face after coming in contact with the bacteria, or through more intimate contact, such as kissing.  Allergies. Allergies can cause buildup of mucus in the throat (post-nasal drip), leading to inflammation and irritation. Allergies can also cause blocked nasal passages, forcing breathing through the mouth, which dries and irritates the throat.  What increases the risk? You are more likely to develop this condition if:  You are 79-75 years old.  You are exposed to crowded environments such as daycare, school, or dormitory living.  You live in a cold climate.  You have a weakened disease-fighting (immune) system.  What are the signs or symptoms? Symptoms of this condition vary by the cause (viral, bacterial, or allergies) and can include:  Sore throat.  Fatigue.  Low-grade fever.  Headache.  Joint pain and muscle aches.  Skin rashes.  Swollen glands in the throat (lymph nodes).  Plaque-like film on the throat or tonsils. This is often a symptom of bacterial pharyngitis.  Vomiting.  Stuffy nose (nasal congestion).  Cough.  Red, itchy eyes (conjunctivitis).  Loss of appetite.  How is this  diagnosed? This condition is often diagnosed based on your medical history and a physical exam. Your health care provider will ask you questions about your illness and your symptoms. A swab of your throat may be done to check for bacteria (rapid strep test). Other lab tests may also be done, depending on the suspected cause, but these are rare. How is this treated? This condition usually gets better in 3-4 days without medicine. Bacterial pharyngitis may be treated with antibiotic medicines. Follow these instructions at home:  Take over-the-counter and prescription medicines only as told by your health care provider. ? If you were prescribed an antibiotic medicine, take it as told by your health care provider. Do not stop taking the antibiotic even if you start to feel better. ? Do not give children aspirin because of the association with Reye syndrome.  Drink enough water and fluids to keep your urine clear or pale yellow.  Get a lot of rest.  Gargle with a salt-water mixture 3-4 times a day or as needed. To make a salt-water mixture, completely dissolve -1 tsp of salt in 1 cup of warm water.  If your health care provider approves, you may use throat lozenges or sprays to soothe your throat. Contact a health care provider if:  You have large, tender lumps in your neck.  You have a rash.  You cough up green, yellow-brown, or bloody spit. Get help right away if:  Your neck becomes stiff.  You drool or are unable to swallow liquids.  You cannot drink or take medicines without vomiting.  You have severe pain that does not go away, even after you take  medicine.  You have trouble breathing, and it is not caused by a stuffy nose.  You have new pain and swelling in your joints such as the knees, ankles, wrists, or elbows. Summary  Pharyngitis is redness, pain, and swelling (inflammation) of the throat (pharynx).  While pharyngitis can be caused by a bacteria, the most common causes  are viral.  Most cases of pharyngitis get better on their own without treatment.  Bacterial pharyngitis is treated with antibiotic medicines. This information is not intended to replace advice given to you by your health care provider. Make sure you discuss any questions you have with your health care provider. Document Released: 05/25/2005 Document Revised: 06/30/2016 Document Reviewed: 06/30/2016 Elsevier Interactive Patient Education  [employer].

## 2017-08-25 NOTE — Progress Notes (Signed)
Subjective:    Patient ID: Lance [name], male    DOB: 1980-08-20, 37 y.o.   MRN: [id number]  HPI He is here for an acute visit for cold symptoms.  His symptoms started 3 days ago  He is experiencing subjective fever, minimal nasal congestion, sore throat, dry cough, headaches.  He denies chills, change in appetite, ear pain, sinus pain, sob, wheeze, nausea, diarrhea and muscle aches.   He has tried taking ziacam, vicks, tylenol   Medications and allergies reviewed with patient and updated if appropriate.  Patient Active Problem List   Diagnosis Date Noted  . PAF (paroxysmal atrial fibrillation) (HCC)   . Nonischemic cardiomyopathy (HCC)   . Morbid obesity due to excess calories (HCC)   . Abnormal TSH 02/08/2015  . Varicose veins of both lower extremities with complications 03/30/2014  . Chalazion of right upper eyelid 02/05/2014  . Chronic venous insufficiency 02/05/2014  . Low back pain 07/27/2013  . Abdominal pain, epigastric 07/27/2013  . Chronic systolic heart failure (HCC) 09/12/2012  . Atrial flutter (HCC) 09/12/2012  . OSA (obstructive sleep apnea) 08/19/2012  . Hypotension 08/13/2012  . Rash 11/14/2010  . Well adult exam 11/14/2010  . VENOUS INSUFFICIENCY, CHRONIC 11/11/2009  . ANKLE PAIN 11/11/2009  . Atrial fibrillation with rapid ventricular response (HCC) 11/09/2008    Current Outpatient Medications on File Prior to Visit  Medication Sig Dispense Refill  . amiodarone (PACERONE) 200 MG tablet Take 1 tablet (200 mg total) 2 (two) times daily by mouth. 120 tablet 30  . famotidine (PEPCID) 20 MG tablet Take 1 tablet (20 mg total) by mouth 2 (two) times daily. 60 tablet 11  . fexofenadine (ALLEGRA) 30 MG tablet Take 30 mg by mouth daily.    . furosemide (LASIX) 40 MG tablet TAKE 1 TABLET BY MOUTH TWICE DAILY 180 tablet 1  . losartan (COZAAR) 25 MG tablet TAKE 1 TABLET BY MOUTH ONCE DAILY 90 tablet 2  . metoprolol tartrate (LOPRESSOR) 25 MG tablet TAKE 1  TABLET BY MOUTH TWICE DAILY 60 tablet 11  . potassium chloride SA (K-DUR,KLOR-CON) 20 MEQ tablet Take 1 tablet (20 mEq total) 2 (two) times daily by mouth. Take with lasix 180 tablet 3  . rivaroxaban (XARELTO) 20 MG TABS tablet Take 1 tablet (20 mg total) by mouth daily with supper. 30 tablet 11  . triamcinolone ointment (KENALOG) 0.1 % Apply 1 application topically 2 (two) times daily. 80 g 2   No current facility-administered medications on file prior to visit.     Past Medical History:  Diagnosis Date  . Acute systolic heart failure (HCC) 09/14/2012  . Atrial flutter [facility])    s/p ablation by Dr [name]  . Medical history non-contributory   . Neuromuscular disorder (HCC)   . Onychomycosis   . OSA on CPAP   . Peripheral vascular disease (HCC)   . Persistent atrial fibrillation (HCC) 2003    Past Surgical History:  Procedure Laterality Date  . ATRIAL FIBRILLATION ABLATION N/A 02/15/2014   Procedure: ATRIAL FIBRILLATION ABLATION;  Surgeon: Gardiner Rhyme, MD;  Location: [facility] CATH LAB;  Service: Cardiovascular;  Laterality: N/A;  . ATRIAL FLUTTER ABLATION  09/14/2012   by Dr [name]  . ATRIAL FLUTTER ABLATION N/A 09/14/2012   Procedure: ATRIAL FLUTTER ABLATION;  Surgeon: Marinus Maw, MD;  Location: [facility] CATH LAB;  Service: Cardiovascular;  Laterality: N/A;  . CARDIAC CATHETERIZATION    . CARDIOVERSION N/A 08/14/2012   Procedure: CARDIOVERSION;  Surgeon: Reuel Boom  Lexine Baton, MD;  Location: [facility] OR;  Service: Cardiovascular;  Laterality: N/A;  . CARDIOVERSION N/A 01/03/2016   Procedure: CARDIOVERSION;  Surgeon: Pricilla Riffle, MD;  Location: [facility] ENDOSCOPY;  Service: Cardiovascular;  Laterality: N/A;  . CARDIOVERSION N/A 03/08/2017   Procedure: CARDIOVERSION;  Surgeon: Dolores Patty, MD;  Location: [facility] ENDOSCOPY;  Service: Cardiovascular;  Laterality: N/A;  . MANDIBLE SURGERY     underbite correction  . TEE WITHOUT CARDIOVERSION N/A 02/14/2014   Procedure: TRANSESOPHAGEAL ECHOCARDIOGRAM (TEE);   Surgeon: Quintella Reichert, MD;  Location: [facility] ENDOSCOPY;  Service: Cardiovascular;  Laterality: N/A;  . TEE WITHOUT CARDIOVERSION N/A 01/03/2016   Procedure: TRANSESOPHAGEAL ECHOCARDIOGRAM (TEE);  Surgeon: Pricilla Riffle, MD;  Location: [facility] ENDOSCOPY;  Service: Cardiovascular;  Laterality: N/A;    Social History   Socioeconomic History  . Marital status: Single    Spouse name: None  . Number of children: None  . Years of education: None  . Highest education level: None  Social Needs  . Financial resource strain: None  . Food insecurity - worry: None  . Food insecurity - inability: None  . Transportation needs - medical: None  . Transportation needs - non-medical: None  Occupational History  . Occupation: [employer]    Employer: [employer]  Tobacco Use  . Smoking status: Former Smoker    Years: 3.00    Types: Cigarettes, Cigars    Last attempt to quit: 05/12/2009    Years since quitting: 8.2  . Smokeless tobacco: Never Used  . Tobacco comment: 4 cigars daily  Substance and Sexual Activity  . Alcohol use: Yes    Comment: Glass of wine on weekends  . Drug use: Yes    Types: Marijuana    Comment: occassional use   . Sexual activity: Yes  Other Topics Concern  . None  Social History Narrative  . None    Family History  Problem Relation Age of Onset  . Diabetes Mother   . Hypertension Mother   . Varicose Veins Mother   . Depression Father   . Osteoarthritis Father   . Alcohol abuse Father     Review of Systems  Constitutional: Positive for fever (subjective). Negative for appetite change and chills.  HENT: Positive for congestion (mild) and sore throat. Negative for ear pain, sinus pressure and sinus pain.   Respiratory: Positive for cough (dry cough). Negative for shortness of breath and wheezing.   Cardiovascular: Negative for chest pain.  Gastrointestinal: Negative for diarrhea and nausea.  Musculoskeletal: Negative for myalgias.  Neurological: Positive for  headaches. Negative for light-headedness.       Objective:   Vitals:   08/25/17 1007  BP: 100/64  Pulse: 69  Resp: 16  Temp: 98.7 F (37.1 C)  SpO2: 98%   Filed Weights   08/25/17 1007  Weight: 290 lb (131.5 kg)   Body mass index is 37.23 kg/m.  Wt Readings from Last 3 Encounters:  08/25/17 290 lb (131.5 kg)  04/13/17 (!) 307 lb (139.3 kg)  03/10/17 (!) 306 lb 12.8 oz (139.2 kg)     Physical Exam GENERAL APPEARANCE: Appears stated age, well appearing, NAD EYES: conjunctiva clear, no icterus HEENT: bilateral tympanic membranes and ear canals normal, oropharynx with mild erythema, no thyromegaly, trachea midline, no cervical or supraclavicular lymphadenopathy LUNGS: Clear to auscultation without wheeze or crackles, unlabored breathing, good air entry bilaterally CARDIOVASCULAR: Normal S1,S2 without murmurs, no edema SKIN: warm, dry  Assessment & Plan:   See Problem List for Assessment and Plan of chronic medical problems.

## 2017-09-08 ENCOUNTER — Other Ambulatory Visit (HOSPITAL_COMMUNITY): Payer: Self-pay | Admitting: Internal Medicine

## 2018-01-25 ENCOUNTER — Ambulatory Visit (HOSPITAL_COMMUNITY)
Admission: RE | Admit: 2018-01-25 | Discharge: 2018-01-25 | Disposition: A | Discharge status: Home / Self Care | Payer: BLUE CROSS/BLUE SHIELD | Source: Ambulatory Visit | Attending: Internal Medicine | Admitting: Internal Medicine

## 2018-01-25 VITALS — BP 121/68 | HR 61 | Wt 288.0 lb

## 2018-01-25 DIAGNOSIS — I739 Peripheral vascular disease, unspecified: Secondary | ICD-10-CM | POA: Insufficient documentation

## 2018-01-25 DIAGNOSIS — I5022 Chronic systolic (congestive) heart failure: Secondary | ICD-10-CM | POA: Insufficient documentation

## 2018-01-25 DIAGNOSIS — G4733 Obstructive sleep apnea (adult) (pediatric): Secondary | ICD-10-CM | POA: Insufficient documentation

## 2018-01-25 DIAGNOSIS — Z79899 Other long term (current) drug therapy: Secondary | ICD-10-CM | POA: Diagnosis not present

## 2018-01-25 DIAGNOSIS — Z9889 Other specified postprocedural states: Secondary | ICD-10-CM | POA: Diagnosis not present

## 2018-01-25 DIAGNOSIS — I480 Paroxysmal atrial fibrillation: Secondary | ICD-10-CM | POA: Insufficient documentation

## 2018-01-25 DIAGNOSIS — I110 Hypertensive heart disease with heart failure: Secondary | ICD-10-CM | POA: Diagnosis not present

## 2018-01-25 DIAGNOSIS — I444 Left anterior fascicular block: Secondary | ICD-10-CM | POA: Diagnosis not present

## 2018-01-25 DIAGNOSIS — I4892 Unspecified atrial flutter: Secondary | ICD-10-CM | POA: Diagnosis not present

## 2018-01-25 DIAGNOSIS — R002 Palpitations: Secondary | ICD-10-CM | POA: Insufficient documentation

## 2018-01-25 DIAGNOSIS — Z7901 Long term (current) use of anticoagulants: Secondary | ICD-10-CM | POA: Insufficient documentation

## 2018-01-25 MED ORDER — AMIODARONE HCL 200 MG PO TABS: 200.0000 mg | ORAL_TABLET | Freq: Every day | ORAL | 30 refills | Status: DC

## 2018-01-25 NOTE — Addendum Note (Signed)
Encounter addended by: Noralee Space, RN on: 01/25/2018 12:12 PM  Actions taken: Order list changed, Sign clinical note

## 2018-01-25 NOTE — Patient Instructions (Signed)
Decrease Amiodarone to 200 mg daily  IF PALPITATIONS INCREASE CALL OUR OFFICE TO GET A ZIO PATCH  Continue weight loss  WEAR YOUR CPAP  We will contact you in 6 months to schedule your next appointment.

## 2018-01-25 NOTE — Progress Notes (Signed)
Patient ID: Lance [name], male   DOB: 08-25-1980, 37 y.o.   MRN: [id number]  EP: Dr Johney Frame  HPI: Lance [name] is a 37 y.o. AAM with PMH significant for PAF, HTN, OSA, and Chronic Systolic Heart Failure     Admitted 08/2012 and found to be in Afib with RVR. He was started on amiodarone and became profoundly hypotensive requiring levophed and IVF.  He underwent TEE and failed DCCV on 3/9.  Dr. [name] placed him on Tikosyn and he had polymorphic VT.  Transitioned to oral amiodarone with good rate response.  Echo during admission showed EF 10% with diffuse hypokinesis.  He was diuresed and discharged on amiodarone, xarelto, digoxin, ramipril, lopressor and lasix.    09/14/12 S/P A-Flutter ablation. EF subsequently returned to normal.   Readmitted in July 2017 with HF and found to have recurrent AF with RVR. EF back down to 25-30%. Underwent attempt at [facility] on 01/03/16. Maintained NSR for 2 mintues and then back to AF. He was placed on IV amiodarone with close monitoring of BP along with oral amiodarone for faster loading. He eventually converted back to NSR on IV amiodarone. D/c weight 300 pounds.   Developer recurrent AF/AFL in 9/18 and underwent DC-CV on 03/08/17. Saw Dr. Johney Frame and is planning for ablation but wants to wait until January.   He presents today for regular follow up. At last visit amiodarone decreased to 200 mg BID. Has been going to gym. Working out 4 times per week. Has lost 20 pounds. Uses CPAP 65% of the time. If falls asleep without CPAP has HA and palpitations. Denies any sustained palpitations. Has seen his HR jump up to 115-120 range for a few mins about 3x/week. No edema, orthopnea or PND.   Echo 10/17 EF 35-40% ECHO 08/2016 EF 50-55%. ECHO  03/03/2017 EF 50-55%   Review of systems complete and found to be negative unless listed in HPI.    Past Medical History:  Diagnosis Date  . Acute systolic heart failure (HCC) 09/14/2012  . Atrial flutter [facility])    s/p ablation by Dr  [name]  . Medical history non-contributory   . Neuromuscular disorder (HCC)   . Onychomycosis   . OSA on CPAP   . Peripheral vascular disease (HCC)   . Persistent atrial fibrillation (HCC) 2003    Current Outpatient Medications  Medication Sig Dispense Refill  . amiodarone (PACERONE) 200 MG tablet Take 1 tablet (200 mg total) 2 (two) times daily by mouth. 120 tablet 30  . famotidine (PEPCID) 20 MG tablet Take 1 tablet (20 mg total) by mouth 2 (two) times daily. 60 tablet 11  . fexofenadine (ALLEGRA) 30 MG tablet Take 30 mg by mouth daily.    . furosemide (LASIX) 40 MG tablet TAKE 1 TABLET BY MOUTH TWICE DAILY 180 tablet 1  . losartan (COZAAR) 25 MG tablet TAKE 1 TABLET BY MOUTH ONCE DAILY 90 tablet 2  . metoprolol tartrate (LOPRESSOR) 25 MG tablet TAKE 1 TABLET BY MOUTH TWICE DAILY 60 tablet 11  . potassium chloride SA (K-DUR,KLOR-CON) 20 MEQ tablet Take 1 tablet (20 mEq total) 2 (two) times daily by mouth. Take with lasix 180 tablet 3  . rivaroxaban (XARELTO) 20 MG TABS tablet Take 1 tablet (20 mg total) by mouth daily with supper. 30 tablet 11  . triamcinolone ointment (KENALOG) 0.1 % Apply 1 application topically 2 (two) times daily. 80 g 2   No current facility-administered medications for this encounter.  Vitals:   01/25/18 1132  BP: 121/68  Pulse: 61  SpO2: 100%  Weight: 130.6 kg (288 lb)    Wt Readings from Last 3 Encounters:  08/25/17 131.5 kg (290 lb)  04/13/17 (!) 139.3 kg (307 lb)  03/10/17 (!) 139.2 kg (306 lb 12.8 oz)    PHYSICAL EXAM: General:  Well appearing. No resp difficulty HEENT: normal Neck: supple. no JVD. Carotids 2+ bilat; no bruits. No lymphadenopathy or thryomegaly appreciated. Cor: PMI nondisplaced. Regular rate & rhythm. No rubs, gallops or murmurs. Lungs: clear Abdomen: obese soft, nontender, nondistended. No hepatosplenomegaly. No bruits or masses. Good bowel sounds. Extremities: no cyanosis, clubbing, rash, edema Neuro: alert &  orientedx3, cranial nerves grossly intact. moves all 4 extremities w/o difficulty. Affect pleasant   EKG:  NSR 62 LAFB No ST-T wave abnormalities Personally reviewed   ASSESSMENT & PLAN: 1. PAF/Atrial flutter - s/p AFL ablation 09/2012 - s/p AF ablation 9/15 - s/p DC-CV 7/17 and 03/08/17 - In NSR today. Still with occasional palpitations. Has refused repeat ablation for now.  - Given his age, would prefer to avoid long-term amio. Will cut amio to 200 daily. If palpitations increase will place monitor to reassess AF/AFL burden. Hopefully AF/AFL burden will decrease with weight loss and CPAP - Now that EF normal may consider Multaq. I will discuss with Dr. Johney Frame.  - ChadsVAsc = 2 (HF, HTN)  - Continue Xarelto. No bleeding 2. Chronic systolic HF, likely tachy mediated - Echo EF 25-30% 8/17 -> echo 10/17 EF 35-40%-->08/2016 EF 50-55% -->03/03/2017 EF 50-55%  - NYHA I - Volume status looks  3. OSA  - Continue nightly CPAP.  4. Morbid obesity - Continue weight loss efforts.  [name], MD  12:03 PM      .

## 2018-01-27 ENCOUNTER — Encounter (HOSPITAL_COMMUNITY): Payer: Self-pay | Admitting: *Deleted

## 2018-01-27 NOTE — Progress Notes (Signed)
Pt's FMLA forms completed and signed by Dr [name].  Faxed to [employer] on 01/26/18.  Left pt a detailed VM that this had been done.  Also mailed pt a copy for his records.

## 2018-03-02 ENCOUNTER — Other Ambulatory Visit (HOSPITAL_COMMUNITY): Payer: Self-pay | Admitting: Internal Medicine

## 2018-03-02 ENCOUNTER — Other Ambulatory Visit (HOSPITAL_COMMUNITY): Payer: Self-pay | Admitting: Cardiology

## 2018-03-04 ENCOUNTER — Other Ambulatory Visit (HOSPITAL_COMMUNITY): Payer: Self-pay | Admitting: *Deleted

## 2018-03-04 MED ORDER — RIVAROXABAN 20 MG PO TABS: ORAL_TABLET | ORAL | 11 refills | Status: DC

## 2018-03-10 ENCOUNTER — Other Ambulatory Visit (INDEPENDENT_AMBULATORY_CARE_PROVIDER_SITE_OTHER): Payer: BLUE CROSS/BLUE SHIELD

## 2018-03-10 ENCOUNTER — Ambulatory Visit (INDEPENDENT_AMBULATORY_CARE_PROVIDER_SITE_OTHER): Payer: BLUE CROSS/BLUE SHIELD | Admitting: Internal Medicine

## 2018-03-10 ENCOUNTER — Encounter: Payer: Self-pay | Admitting: Internal Medicine

## 2018-03-10 VITALS — BP 112/64 | HR 64 | Temp 98.2°F | Ht 74.0 in | Wt 295.0 lb

## 2018-03-10 DIAGNOSIS — Z0000 Encounter for general adult medical examination without abnormal findings: Secondary | ICD-10-CM

## 2018-03-10 DIAGNOSIS — Z23 Encounter for immunization: Secondary | ICD-10-CM

## 2018-03-10 DIAGNOSIS — I4891 Unspecified atrial fibrillation: Secondary | ICD-10-CM

## 2018-03-10 LAB — CBC WITH DIFFERENTIAL/PLATELET
Basophils Absolute: 0.1 10*3/uL (ref 0.0–0.1)
Basophils Relative: 0.7 % (ref 0.0–3.0)
Eosinophils Absolute: 0.1 10*3/uL (ref 0.0–0.7)
Eosinophils Relative: 1.4 % (ref 0.0–5.0)
HEMATOCRIT: 42.5 % (ref 39.0–52.0)
Hemoglobin: 14.6 g/dL (ref 13.0–17.0)
LYMPHS PCT: 17.2 % (ref 12.0–46.0)
Lymphs Abs: 1.3 10*3/uL (ref 0.7–4.0)
MCHC: 34.4 g/dL (ref 30.0–36.0)
MCV: 90.1 fl (ref 78.0–100.0)
MONOS PCT: 8.3 % (ref 3.0–12.0)
Monocytes Absolute: 0.6 10*3/uL (ref 0.1–1.0)
NEUTROS ABS: 5.4 10*3/uL (ref 1.4–7.7)
Neutrophils Relative %: 72.4 % (ref 43.0–77.0)
PLATELETS: 262 10*3/uL (ref 150.0–400.0)
RBC: 4.72 Mil/uL (ref 4.22–5.81)
RDW: 13.9 % (ref 11.5–15.5)
WBC: 7.4 10*3/uL (ref 4.0–10.5)

## 2018-03-10 LAB — LIPID PANEL
CHOL/HDL RATIO: 4
Cholesterol: 134 mg/dL (ref 0–200)
HDL: 38.2 mg/dL — ABNORMAL LOW (ref >39.00)
LDL Cholesterol: 73 mg/dL (ref 0–99)
NonHDL: 96.01
TRIGLYCERIDES: 116 mg/dL (ref 0.0–149.0)
VLDL: 23.2 mg/dL (ref 0.0–40.0)

## 2018-03-10 LAB — BASIC METABOLIC PANEL
BUN: 18 mg/dL (ref 6–23)
CALCIUM: 9.4 mg/dL (ref 8.4–10.5)
CO2: 30 meq/L (ref 19–32)
CREATININE: 1.06 mg/dL (ref 0.40–1.50)
Chloride: 104 mEq/L (ref 96–112)
GFR: 100.12 mL/min (ref >60.00)
Glucose, Bld: 92 mg/dL (ref 70–99)
Potassium: 4 mEq/L (ref 3.5–5.1)
Sodium: 140 mEq/L (ref 135–145)

## 2018-03-10 LAB — URINALYSIS
Bilirubin Urine: NEGATIVE
Hgb urine dipstick: NEGATIVE
KETONES UR: NEGATIVE
LEUKOCYTES UA: NEGATIVE
Nitrite: NEGATIVE
SPECIFIC GRAVITY, URINE: 1.015 (ref 1.000–1.030)
Total Protein, Urine: NEGATIVE
URINE GLUCOSE: NEGATIVE
Urobilinogen, UA: 0.2 (ref 0.0–1.0)
pH: 5.5 (ref 5.0–8.0)

## 2018-03-10 LAB — HEPATIC FUNCTION PANEL
ALBUMIN: 4.6 g/dL (ref 3.5–5.2)
ALK PHOS: 51 U/L (ref 39–117)
ALT: 50 U/L (ref 0–53)
AST: 29 U/L (ref 0–37)
Bilirubin, Direct: 0.1 mg/dL (ref 0.0–0.3)
TOTAL PROTEIN: 7.9 g/dL (ref 6.0–8.3)
Total Bilirubin: 0.5 mg/dL (ref 0.2–1.2)

## 2018-03-10 LAB — TSH: TSH: 2.03 u[IU]/mL (ref 0.35–4.50)

## 2018-03-10 MED ORDER — CIMETIDINE 800 MG PO TABS: 800.0000 mg | ORAL_TABLET | Freq: Every day | ORAL | 3 refills | Status: DC

## 2018-03-10 MED ORDER — PODOFILOX 0.5 % EX GEL: Freq: Two times a day (BID) | CUTANEOUS | 0 refills | Status: AC

## 2018-03-10 NOTE — Assessment & Plan Note (Addendum)
We discussed age appropriate health related issues, including available/recomended screening tests and vaccinations. We discussed a need for adhering to healthy diet and exercise. Labs ordered. All questions were answered. Loose wt Condylox/Cimetidine for gen warts Flu shot

## 2018-03-10 NOTE — Addendum Note (Signed)
Addended by: Scarlett Presto on: 03/10/2018 09:23 AM   Modules accepted: Orders

## 2018-03-10 NOTE — Assessment & Plan Note (Signed)
Amiodarone.

## 2018-03-10 NOTE — Progress Notes (Signed)
Subjective:  Patient ID: Lance [name], male    DOB: 1981/02/18  Age: 37 y.o. MRN: [id number]  CC: No chief complaint on file.   HPI Lance [name] presents for a well exam Moving to his first home Walking daily  Outpatient Medications Prior to Visit  Medication Sig Dispense Refill  . amiodarone (PACERONE) 200 MG tablet Take 1 tablet (200 mg total) by mouth daily. 120 tablet 30  . amiodarone (PACERONE) 200 MG tablet TAKE 2 TABLETS BY MOUTH TWICE DAILY 120 tablet 1  . famotidine (PEPCID) 20 MG tablet TAKE 1 TABLET BY MOUTH TWICE DAILY 60 tablet 11  . fexofenadine (ALLEGRA) 30 MG tablet Take 30 mg by mouth daily.    . furosemide (LASIX) 40 MG tablet TAKE 1 TABLET BY MOUTH TWICE DAILY 180 tablet 1  . losartan (COZAAR) 25 MG tablet TAKE 1 TABLET BY MOUTH ONCE DAILY 90 tablet 3  . metoprolol tartrate (LOPRESSOR) 25 MG tablet TAKE 1 TABLET BY MOUTH TWICE DAILY 60 tablet 11  . potassium chloride SA (K-DUR,KLOR-CON) 20 MEQ tablet Take 1 tablet (20 mEq total) 2 (two) times daily by mouth. Take with lasix 180 tablet 3  . rivaroxaban (XARELTO) 20 MG TABS tablet TAKE 1 TABLET BY MOUTH ONCE DAILY WITH SUPPER 30 tablet 11  . triamcinolone ointment (KENALOG) 0.1 % Apply 1 application topically 2 (two) times daily. 80 g 2   No facility-administered medications prior to visit.     ROS: Review of Systems  Constitutional: Positive for unexpected weight change. Negative for appetite change and fatigue.  HENT: Negative for congestion, nosebleeds, sneezing, sore throat and trouble swallowing.   Eyes: Negative for itching and visual disturbance.  Respiratory: Negative for cough.   Cardiovascular: Negative for chest pain, palpitations and leg swelling.  Gastrointestinal: Negative for abdominal distention, blood in stool, diarrhea and nausea.  Genitourinary: Negative for frequency and hematuria.  Musculoskeletal: Negative for back pain, gait problem, joint swelling and neck pain.  Skin: Negative for  rash.  Neurological: Negative for dizziness, tremors, speech difficulty and weakness.  Psychiatric/Behavioral: Negative for agitation, dysphoric mood, sleep disturbance and suicidal ideas. The patient is not nervous/anxious.     Objective:  BP 112/64 (BP Location: Left Arm, Patient Position: Sitting, Cuff Size: Large)   Pulse 64   Temp 98.2 F (36.8 C) (Oral)   Ht 6\' 2"  (1.88 m)   Wt 295 lb (133.8 kg)   SpO2 95%   BMI 37.88 kg/m   BP Readings from Last 3 Encounters:  03/10/18 112/64  01/25/18 121/68  08/25/17 100/64    Wt Readings from Last 3 Encounters:  03/10/18 295 lb (133.8 kg)  01/25/18 288 lb (130.6 kg)  08/25/17 290 lb (131.5 kg)    Physical Exam  Constitutional: He is oriented to person, place, and time. He appears well-developed. No distress.  NAD  HENT:  Mouth/Throat: Oropharynx is clear and moist.  Eyes: Pupils are equal, round, and reactive to light. Conjunctivae are normal.  Neck: Normal range of motion. No JVD present. No thyromegaly present.  Cardiovascular: Normal rate, regular rhythm, normal heart sounds and intact distal pulses. Exam reveals no gallop and no friction rub.  No murmur heard. Pulmonary/Chest: Effort normal and breath sounds normal. No respiratory distress. He has no wheezes. He has no rales. He exhibits no tenderness.  Abdominal: Soft. Bowel sounds are normal. He exhibits no distension and no mass. There is no tenderness. There is no rebound and no guarding.  Musculoskeletal: Normal  range of motion. He exhibits no edema or tenderness.  Lymphadenopathy:    He has no cervical adenopathy.  Neurological: He is alert and oriented to person, place, and time. He has normal reflexes. No cranial nerve deficit. He exhibits normal muscle tone. He displays a negative Romberg sign. Coordination and gait normal.  Skin: Skin is warm and dry. No rash noted.  Psychiatric: He has a normal mood and affect. His behavior is normal. Judgment and thought content  normal.  obese abdomen Testes - self exam  Lab Results  Component Value Date   WBC 6.2 02/17/2017   HGB 13.9 03/08/2017   HCT 41.0 03/08/2017   PLT 240.0 02/17/2017   GLUCOSE 95 03/08/2017   CHOL 145 02/17/2017   TRIG 102.0 02/17/2017   HDL 44.90 02/17/2017   LDLCALC 79 02/17/2017   ALT 21 02/17/2017   AST 19 02/17/2017   NA 143 03/08/2017   K 3.7 03/08/2017   CL 105 02/17/2017   CREATININE 1.11 02/17/2017   BUN 16 02/17/2017   CO2 24 02/17/2017   TSH 2.50 02/17/2017   PSA 0.45 02/01/2013   INR 1.07 01/01/2016    No results found.  Assessment & Plan:   There are no diagnoses linked to this encounter.   No orders of the defined types were placed in this encounter.    Follow-up: No follow-ups on file.  Sonda Primes, MD

## 2018-03-11 ENCOUNTER — Encounter: Payer: BLUE CROSS/BLUE SHIELD | Admitting: Internal Medicine

## 2018-03-11 LAB — HIV ANTIBODY (ROUTINE TESTING W REFLEX): HIV: NONREACTIVE

## 2018-05-22 ENCOUNTER — Other Ambulatory Visit (HOSPITAL_COMMUNITY): Payer: Self-pay | Admitting: Internal Medicine

## 2018-06-24 ENCOUNTER — Other Ambulatory Visit (HOSPITAL_COMMUNITY): Payer: Self-pay | Admitting: Internal Medicine

## 2018-07-24 ENCOUNTER — Other Ambulatory Visit (HOSPITAL_COMMUNITY): Payer: Self-pay | Admitting: Internal Medicine

## 2018-08-30 ENCOUNTER — Other Ambulatory Visit (HOSPITAL_COMMUNITY): Payer: Self-pay

## 2018-08-30 NOTE — Telephone Encounter (Signed)
Received fax for refill pepcid.  Refill not appropriate.

## 2018-09-10 ENCOUNTER — Other Ambulatory Visit (HOSPITAL_COMMUNITY): Payer: Self-pay | Admitting: Internal Medicine

## 2018-09-20 ENCOUNTER — Other Ambulatory Visit: Payer: Self-pay | Admitting: Internal Medicine

## 2018-09-22 ENCOUNTER — Other Ambulatory Visit (HOSPITAL_COMMUNITY): Payer: Self-pay | Admitting: Internal Medicine

## 2018-11-09 ENCOUNTER — Other Ambulatory Visit (HOSPITAL_COMMUNITY): Payer: Self-pay | Admitting: Internal Medicine

## 2018-12-08 ENCOUNTER — Other Ambulatory Visit (HOSPITAL_COMMUNITY): Payer: Self-pay

## 2019-01-12 ENCOUNTER — Encounter (HOSPITAL_COMMUNITY): Payer: Self-pay | Admitting: Internal Medicine

## 2019-01-12 ENCOUNTER — Ambulatory Visit (HOSPITAL_COMMUNITY)
Admission: RE | Admit: 2019-01-12 | Discharge: 2019-01-12 | Disposition: A | Discharge status: Home / Self Care | Payer: BC Managed Care – PPO | Source: Ambulatory Visit | Attending: Internal Medicine | Admitting: Internal Medicine

## 2019-01-12 ENCOUNTER — Other Ambulatory Visit: Payer: Self-pay

## 2019-01-12 VITALS — BP 120/72 | HR 72 | Wt 302.6 lb

## 2019-01-12 DIAGNOSIS — I8393 Asymptomatic varicose veins of bilateral lower extremities: Secondary | ICD-10-CM | POA: Insufficient documentation

## 2019-01-12 DIAGNOSIS — I110 Hypertensive heart disease with heart failure: Secondary | ICD-10-CM | POA: Insufficient documentation

## 2019-01-12 DIAGNOSIS — I5022 Chronic systolic (congestive) heart failure: Secondary | ICD-10-CM | POA: Diagnosis not present

## 2019-01-12 DIAGNOSIS — Z7901 Long term (current) use of anticoagulants: Secondary | ICD-10-CM | POA: Diagnosis not present

## 2019-01-12 DIAGNOSIS — I739 Peripheral vascular disease, unspecified: Secondary | ICD-10-CM | POA: Insufficient documentation

## 2019-01-12 DIAGNOSIS — I480 Paroxysmal atrial fibrillation: Secondary | ICD-10-CM | POA: Diagnosis not present

## 2019-01-12 DIAGNOSIS — Z79899 Other long term (current) drug therapy: Secondary | ICD-10-CM | POA: Insufficient documentation

## 2019-01-12 DIAGNOSIS — Z6838 Body mass index (BMI) 38.0-38.9, adult: Secondary | ICD-10-CM | POA: Insufficient documentation

## 2019-01-12 DIAGNOSIS — G4733 Obstructive sleep apnea (adult) (pediatric): Secondary | ICD-10-CM

## 2019-01-12 DIAGNOSIS — I4892 Unspecified atrial flutter: Secondary | ICD-10-CM | POA: Diagnosis not present

## 2019-01-12 LAB — CBC
HCT: 43.9 % (ref 39.0–52.0)
Hemoglobin: 14.7 g/dL (ref 13.0–17.0)
MCH: 30.8 pg (ref 26.0–34.0)
MCHC: 33.5 g/dL (ref 30.0–36.0)
MCV: 92 fL (ref 80.0–100.0)
Platelets: 268 10*3/uL (ref 150–400)
RBC: 4.77 MIL/uL (ref 4.22–5.81)
RDW: 13.3 % (ref 11.5–15.5)
WBC: 6.5 10*3/uL (ref 4.0–10.5)
nRBC: 0 % (ref 0.0–0.2)

## 2019-01-12 LAB — COMPREHENSIVE METABOLIC PANEL
ALT: 34 U/L (ref 0–44)
AST: 27 U/L (ref 15–41)
Albumin: 4.1 g/dL (ref 3.5–5.0)
Alkaline Phosphatase: 55 U/L (ref 38–126)
Anion gap: 11 (ref 5–15)
BUN: 16 mg/dL (ref 6–20)
CO2: 22 mmol/L (ref 22–32)
Calcium: 9 mg/dL (ref 8.9–10.3)
Chloride: 104 mmol/L (ref 98–111)
Creatinine, Ser: 1.31 mg/dL — ABNORMAL HIGH (ref 0.61–1.24)
GFR calc Af Amer: >60 mL/min (ref >60)
GFR calc non Af Amer: >60 mL/min (ref >60)
Glucose, Bld: 105 mg/dL — ABNORMAL HIGH (ref 70–99)
Potassium: 3.8 mmol/L (ref 3.5–5.1)
Sodium: 137 mmol/L (ref 135–145)
Total Bilirubin: 0.6 mg/dL (ref 0.3–1.2)
Total Protein: 7.3 g/dL (ref 6.5–8.1)

## 2019-01-12 LAB — T4, FREE: Free T4: 1.31 ng/dL — ABNORMAL HIGH (ref 0.61–1.12)

## 2019-01-12 LAB — TSH: TSH: 0.997 u[IU]/mL (ref 0.350–4.500)

## 2019-01-12 NOTE — Progress Notes (Signed)
Zio patch placed onto patient.  All instructions and information reviewed with patient, they verbalize understanding with no questions. 

## 2019-01-12 NOTE — Patient Instructions (Addendum)
Lab work done today. We will notify you of any abnormal lab work. No news is good news   EKG done today.  Your physician has requested that you have an echocardiogram. Echocardiography is a painless test that uses sound waves to create images of your heart. It provides your doctor with information about the size and shape of your heart and how well your heart's chambers and valves are working. This procedure takes approximately one hour. There are no restrictions for this procedure.  Your provider has recommended that  you wear a Zio Patch for 7 days.  This monitor will record your heart rhythm for our review.  IF you have any symptoms while wearing the monitor please press the button.  If you have any issues with the patch or you notice a red or orange light on it please call the company at [phone].  Once you remove the patch please mail it back to the company as soon as possible so we can get the results.  Please follow up with Dr. Jackalyn Lombard office.  Please follow up with the [facility] in 6 months.  At the [facility], you and your health needs are our priority. As part of our continuing mission to provide you with exceptional heart care, we have created designated Provider Care Teams. These Care Teams include your primary Cardiologist (physician) and Advanced Practice Providers (APPs- Physician Assistants and Nurse Practitioners) who all work together to provide you with the care you need, when you need it.   You may see any of the following providers on your designated Care Team at your next follow up: Marland Kitchen Dr Glori Bickers . Dr Loralie Champagne . Darrick Grinder, NP   Please be sure to bring in all your medications bottles to every appointment.

## 2019-01-12 NOTE — Progress Notes (Signed)
ADVANCED HF CLINIC NOTE  Patient ID: Lance [name], male   DOB: May 10, 1981, 38 y.o.   MRN: [id number]  EP: Dr Rayann Heman  HPI: Lance [name] is a 38 y.o. AAM with PMH significant for PAF, HTN, OSA, and Chronic Systolic Heart Failure     Admitted 08/2012 and found to be in Afib with RVR. He was started on amiodarone and became profoundly hypotensive requiring levophed and IVF.  He underwent TEE and failed DCCV on 3/9.  Dr. Lovena Le placed him on Tikosyn and he had polymorphic VT.  Transitioned to oral amiodarone with good rate response.  Echo during admission showed EF 10% with diffuse hypokinesis.  He was diuresed and discharged on amiodarone, xarelto, digoxin, ramipril, lopressor and lasix.    09/14/12 S/P A-Flutter ablation. EF subsequently returned to normal.   Readmitted in July 2017 with HF and found to have recurrent AF with RVR. EF back down to 25-30%. Underwent attempt at [facility] on 01/03/16. Maintained NSR for 2 mintues and then back to AF. He was placed on IV amiodarone with close monitoring of BP along with oral amiodarone for faster loading. He eventually converted back to NSR on IV amiodarone. D/c weight 300 pounds.   Developed recurrent AF/AFL in 9/18 and underwent DC-CV on 03/08/17. Saw Dr. Rayann Heman and was planning for ablation but decided to defer.Marland Kitchen   He presents today for regular follow up. Says that when we dropped amio from 200 bid to 200 daily he was having more AF so now taking amio 400 daily. Still feels that he is having AF 1-2x/day for 4-5 minutes that he follows on Apple Watch. Rate pops up from 70s to low 100s. No edema,orthopnea or PND. Wearing BIPAP at home. Says he uses about 50%. Exercising regularly.   Echo 10/17 EF 35-40% ECHO 08/2016 EF 50-55%. ECHO  03/03/2017 EF 50-55%   Review of systems complete and found to be negative unless listed in HPI.    Past Medical History:  Diagnosis Date  . Acute systolic heart failure ([place]) 09/14/2012  . Atrial flutter [facility])    s/p ablation  by Dr Lovena Le  . Medical history non-contributory   . Neuromuscular disorder ([place])   . Onychomycosis   . OSA on CPAP   . Peripheral vascular disease ([place])   . Persistent atrial fibrillation 2003    Current Outpatient Medications  Medication Sig Dispense Refill  . amiodarone (PACERONE) 200 MG tablet Take 400 mg by mouth daily.    . cimetidine (TAGAMET) 800 MG tablet Take 800 mg by mouth at bedtime.    . fexofenadine (ALLEGRA) 30 MG tablet Take 30 mg by mouth daily.    . furosemide (LASIX) 40 MG tablet Take 40 mg by mouth daily.    Marland Kitchen losartan (COZAAR) 25 MG tablet TAKE 1 TABLET BY MOUTH ONCE DAILY 90 tablet 3  . metoprolol succinate (TOPROL-XL) 25 MG 24 hr tablet Take 25 mg by mouth daily.    . potassium chloride SA (K-DUR) 20 MEQ tablet Take 20 mEq by mouth daily.    . rivaroxaban (XARELTO) 20 MG TABS tablet TAKE 1 TABLET BY MOUTH ONCE DAILY WITH SUPPER 30 tablet 11   No current facility-administered medications for this encounter.     Vitals:   01/12/19 1502  BP: 120/72  Pulse: 72  SpO2: 98%  Weight: (!) 137.3 kg (302 lb 9.6 oz)    Wt Readings from Last 3 Encounters:  01/12/19 (!) 137.3 kg (302 lb 9.6 oz)  03/10/18  133.8 kg (295 lb)  01/25/18 130.6 kg (288 lb)    PHYSICAL EXAM: General:  Well appearing. No resp difficulty HEENT: normal Neck: supple. no JVD. Carotids 2+ bilat; no bruits. No lymphadenopathy or thryomegaly appreciated. Cor: PMI nondisplaced. Regular rate & rhythm. No rubs, gallops or murmurs. Lungs: clear Abdomen: obese soft, nontender, nondistended. No hepatosplenomegaly. No bruits or masses. Good bowel sounds. Extremities: no cyanosis, clubbing, rash, edema. + varicose veins R.L Neuro: alert & orientedx3, cranial nerves grossly intact. moves all 4 extremities w/o difficulty. Affect pleasant   EKG:  NSR 66 +PACs LAFB No ST-T wave abnormalities Personally reviewed   ASSESSMENT & PLAN: 1. PAF/Atrial flutter - s/p AFL ablation 09/2012 - s/p AF ablation  9/15 - s/p DC-CV 7/17 and 03/08/17 - In NSR today. Still with occasional palpitations. Has refused repeat ablation recently - Given his age, would prefer to avoid long-term amio. He was unable to tolerate low-dose amio at 200 daily due to significant AF burden. Now back on 400 daily - Will place Ziopatch on him today for 7 days to quantify AF burden and will have him f/u with Dr. Johney Frame to discuss possible repeat ablation and/or adjustment AA regimen - Stressed need for better compliance with BIPAP - Now that EF normal may consider Multaq. I will discuss with Dr. Johney Frame.  - ChadsVAsc = 2 (HF, HTN)  - Continue Xarelto. No bleeding 2. Chronic systolic HF, likely tachy mediated - Echo EF 25-30% 8/17 -> echo 10/17 EF 35-40%-->08/2016 EF 50-55% -->03/03/2017 EF 50-55%  - NYHA I - Volume status looks good - Will repeat echo   3. OSA  - Continue nightly CPAP - Stressed need for Increased complaince 4. Morbid obesity - Continue weight loss efforts. 5. Varicose veins - has seen [facility] - continue compression hose  [name], MD  3:06 PM      .

## 2019-01-13 LAB — T3, FREE: T3, Free: 3 pg/mL (ref 2.0–4.4)

## 2019-01-18 ENCOUNTER — Other Ambulatory Visit (HOSPITAL_COMMUNITY): Payer: Self-pay | Admitting: Internal Medicine

## 2019-01-18 ENCOUNTER — Telehealth (HOSPITAL_COMMUNITY): Payer: Self-pay

## 2019-01-18 NOTE — Telephone Encounter (Signed)
[employer]ml[place]/ disability paper work filled out and signed by Dr. Haroldine Laws. Faxed to [employer] per pt instructions. Original to be scanned into chart

## 2019-01-26 DIAGNOSIS — I482 Chronic atrial fibrillation, unspecified: Secondary | ICD-10-CM | POA: Diagnosis not present

## 2019-01-30 NOTE — Addendum Note (Signed)
Encounter addended by: Micki Riley, RN on: 01/30/2019 4:18 PM  Actions taken: Imaging Exam ended

## 2019-02-02 ENCOUNTER — Ambulatory Visit (HOSPITAL_COMMUNITY)
Admission: RE | Admit: 2019-02-02 | Discharge: 2019-02-02 | Disposition: A | Discharge status: Home / Self Care | Payer: BC Managed Care – PPO | Source: Ambulatory Visit | Attending: Cardiology | Admitting: Cardiology

## 2019-02-02 ENCOUNTER — Other Ambulatory Visit: Payer: Self-pay

## 2019-02-02 DIAGNOSIS — I4892 Unspecified atrial flutter: Secondary | ICD-10-CM | POA: Diagnosis not present

## 2019-02-02 DIAGNOSIS — G709 Myoneural disorder, unspecified: Secondary | ICD-10-CM | POA: Diagnosis not present

## 2019-02-02 DIAGNOSIS — I4891 Unspecified atrial fibrillation: Secondary | ICD-10-CM | POA: Insufficient documentation

## 2019-02-02 DIAGNOSIS — G4730 Sleep apnea, unspecified: Secondary | ICD-10-CM | POA: Insufficient documentation

## 2019-02-02 DIAGNOSIS — I5022 Chronic systolic (congestive) heart failure: Secondary | ICD-10-CM | POA: Diagnosis not present

## 2019-02-02 NOTE — Progress Notes (Signed)
  Echocardiogram 2D Echocardiogram has been performed.  Lance [name] 02/02/2019, 4:27 PM

## 2019-02-07 ENCOUNTER — Telehealth: Payer: Self-pay

## 2019-02-14 NOTE — Telephone Encounter (Signed)
Left a message regarding appt on 02/15/19. 

## 2019-02-15 ENCOUNTER — Telehealth: Payer: Self-pay

## 2019-02-15 ENCOUNTER — Telehealth (INDEPENDENT_AMBULATORY_CARE_PROVIDER_SITE_OTHER): Payer: BC Managed Care – PPO | Admitting: Internal Medicine

## 2019-02-15 DIAGNOSIS — G4733 Obstructive sleep apnea (adult) (pediatric): Secondary | ICD-10-CM | POA: Diagnosis not present

## 2019-02-15 DIAGNOSIS — I480 Paroxysmal atrial fibrillation: Secondary | ICD-10-CM | POA: Diagnosis not present

## 2019-02-15 NOTE — Telephone Encounter (Signed)
Call placed to Pt.  Pt scheduled for 03/30/2019 at 7:30 am.

## 2019-02-15 NOTE — Telephone Encounter (Signed)
-----   Message from Thompson Grayer, MD sent at 02/15/2019  3:40 PM EDT ----- Afib ablation Carto,ICE, anesthesia  CT  Also consent for implantable loop recorder at time of his ablation (will do at [facility])

## 2019-02-15 NOTE — Progress Notes (Signed)
Electrophysiology TeleHealth Note   Due to national recommendations of social distancing due to COVID 19, an audio/video telehealth visit is felt to be most appropriate for this patient at this time.  See MyChart message from today for the patient's consent to telehealth for [facility].   Date:  02/15/2019   ID:  Lance [name], DOB 1981/05/25, MRN [id number]  Location: patient's home  Provider location:  [facility]  Evaluation Performed: Follow-up visit  PCP:  Plotnikov, Georgina Quint, MD   Electrophysiologist:  Dr Johney Frame  Chief Complaint:  palpitations  History of Present Illness:    Lance [name] is a 38 y.o. male who presents via telehealth conferencing today.  Since last being seen in our clinic, the patient reports doing reasonably well.  He continues to have increasing episodes of afib.  He is taking amiodarone 400mg  daily.  He has palpitations and fatigue with his afib.  Today, he denies symptoms of chest pain, shortness of breath,  lower extremity edema, dizziness, presyncope, or syncope.  The patient is otherwise without complaint today.  The patient denies symptoms of fevers, chills, cough, or new SOB worrisome for COVID 19.  Past Medical History:  Diagnosis Date  . Acute systolic heart failure (H[facility]) 09/14/2012  . Atrial flutter [facility])    s/p ablation by Dr [name]  . Medical history non-contributory   . Neuromuscular disorder (H[facility])   . Onychomycosis   . OSA on CPAP   . Peripheral vascular disease (H[facility])   . Persistent atrial fibrillation 2003    Past Surgical History:  Procedure Laterality Date  . ATRIAL FIBRILLATION ABLATION N/A 02/15/2014   Procedure: ATRIAL FIBRILLATION ABLATION;  Surgeon: Gardiner Rhyme, MD;  Location: [facility] CATH LAB;  Service: Cardiovascular;  Laterality: N/A;  . ATRIAL FLUTTER ABLATION  09/14/2012   by Dr [name]  . ATRIAL FLUTTER ABLATION N/A 09/14/2012   Procedure: ATRIAL FLUTTER ABLATION;  Surgeon: Marinus Maw, MD;  Location: [facility] CATH  LAB;  Service: Cardiovascular;  Laterality: N/A;  . CARDIAC CATHETERIZATION    . CARDIOVERSION N/A 08/14/2012   Procedure: CARDIOVERSION;  Surgeon: Dolores Patty, MD;  Location: [facility] OR;  Service: Cardiovascular;  Laterality: N/A;  . CARDIOVERSION N/A 01/03/2016   Procedure: CARDIOVERSION;  Surgeon: Pricilla Riffle, MD;  Location: [facility] ENDOSCOPY;  Service: Cardiovascular;  Laterality: N/A;  . CARDIOVERSION N/A 03/08/2017   Procedure: CARDIOVERSION;  Surgeon: Dolores Patty, MD;  Location: [facility] ENDOSCOPY;  Service: Cardiovascular;  Laterality: N/A;  . MANDIBLE SURGERY     underbite correction  . TEE WITHOUT CARDIOVERSION N/A 02/14/2014   Procedure: TRANSESOPHAGEAL ECHOCARDIOGRAM (TEE);  Surgeon: Quintella Reichert, MD;  Location: [facility] ENDOSCOPY;  Service: Cardiovascular;  Laterality: N/A;  . TEE WITHOUT CARDIOVERSION N/A 01/03/2016   Procedure: TRANSESOPHAGEAL ECHOCARDIOGRAM (TEE);  Surgeon: Pricilla Riffle, MD;  Location: [facility] ENDOSCOPY;  Service: Cardiovascular;  Laterality: N/A;    Current Outpatient Medications  Medication Sig Dispense Refill  . amiodarone (PACERONE) 200 MG tablet Take 2 tablets by mouth twice daily 120 tablet 0  . cimetidine (TAGAMET) 800 MG tablet Take 800 mg by mouth at bedtime.    . fexofenadine (ALLEGRA) 30 MG tablet Take 30 mg by mouth daily.    . furosemide (LASIX) 40 MG tablet Take 1 tablet by mouth twice daily 180 tablet 0  . losartan (COZAAR) 25 MG tablet TAKE 1 TABLET BY MOUTH ONCE DAILY 90 tablet 3  . metoprolol succinate (TOPROL-XL) 25 MG 24 hr  tablet Take 25 mg by mouth daily.    . potassium chloride SA (K-DUR) 20 MEQ tablet TAKE 1  BY MOUTH TWICE DAILY WITH FUROSEMIDE 60 tablet 0  . rivaroxaban (XARELTO) 20 MG TABS tablet TAKE 1 TABLET BY MOUTH ONCE DAILY WITH SUPPER 30 tablet 11   No current facility-administered medications for this visit.     Allergies:   Amiodarone, Altace [ramipril], Azithromycin, Protonix [pantoprazole sodium], Fish allergy, and Metoprolol  succinate   Social History:  The patient  reports that he quit smoking about 9 years ago. His smoking use included cigarettes and cigars. He quit after 3.00 years of use. He has never used smokeless tobacco. He reports current alcohol use. He reports current drug use. Drug: Marijuana.   Family History:  The patient's family history includes Alcohol abuse in his father; Depression in his father; Diabetes in his mother; Hypertension in his mother; Osteoarthritis in his father; Varicose Veins in his mother.   ROS:  Please see the history of present illness.   All other systems are personally reviewed and negative.    Exam:    Vital Signs:  There were no vitals taken for this visit.  Well sounding and appearing, alert and conversant, regular work of breathing,  good skin color Eyes- anicteric, neuro- grossly intact, skin- no apparent rash or lesions or cyanosis, mouth- oral mucosa is pink  Labs/Other Tests and Data Reviewed:    Recent Labs: 01/12/2019: ALT 34; BUN 16; Creatinine, Ser 1.31; Hemoglobin 14.7; Platelets 268; Potassium 3.8; Sodium 137; TSH 0.997   Wt Readings from Last 3 Encounters:  01/12/19 (!) 302 lb 9.6 oz (137.3 kg)  03/10/18 295 lb (133.8 kg)  01/25/18 288 lb (130.6 kg)    Recent Zio monitor reviewed   ASSESSMENT & PLAN:    1.  afib The patient has symptomatic, recurrent paroxysmal atrial fibrillation. He is s/p CTI ablation by Dr Lovena Le in 2014 and PVI by me in 2015. he has failed medical therapy with tikosyn (polymorphic VT) and amiodarone (AF recurrence).  he is anticoagulated with xarelto Therapeutic strategies for afib including medicine and ablation were discussed in detail with the patient today. Risk, benefits, and alternatives to repeat EP study and radiofrequency ablation for afib were also discussed in detail today. These risks include but are not limited to stroke, bleeding, vascular damage, tamponade, perforation, damage to the esophagus, lungs, and other  structures, pulmonary vein stenosis, worsening renal function, and death. The patient understands these risk and wishes to proceed.  We will therefore proceed with catheter ablation at the next available time.  Carto, ICE, anesthesia are requested for the procedure.  Will also obtain cardiac CT prior to the procedure to exclude LAA thrombus and further evaluate atrial anatomy.  I have also advised implantable loop recorder placement at the time of his ablation.  I think that this will provide very helpful guidance as we try to manage his afib post ablation.  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 HTN Stable No change required today  5. OSA Compliance with CPAP is encouraged    Patient Risk:  after full review of this patients clinical status, I feel that they are at highrisk at this time.  Today, I have spent 20 minutes with the patient with telehealth technology discussing arrhythmia management .    Army Fossa, MD  02/15/2019 3:28 PM     [address]  [address] [place] [address] [phone] (office) [phone] (fax)

## 2019-02-20 NOTE — Telephone Encounter (Signed)
Work up complete. 

## 2019-03-16 ENCOUNTER — Encounter: Payer: Self-pay | Admitting: Internal Medicine

## 2019-03-16 ENCOUNTER — Other Ambulatory Visit: Payer: Self-pay

## 2019-03-16 ENCOUNTER — Other Ambulatory Visit (INDEPENDENT_AMBULATORY_CARE_PROVIDER_SITE_OTHER): Payer: BC Managed Care – PPO

## 2019-03-16 ENCOUNTER — Ambulatory Visit (INDEPENDENT_AMBULATORY_CARE_PROVIDER_SITE_OTHER): Payer: BC Managed Care – PPO | Admitting: Internal Medicine

## 2019-03-16 VITALS — BP 118/62 | HR 57 | Temp 97.6°F | Ht 74.0 in | Wt 309.0 lb

## 2019-03-16 DIAGNOSIS — Z0000 Encounter for general adult medical examination without abnormal findings: Secondary | ICD-10-CM

## 2019-03-16 DIAGNOSIS — I4892 Unspecified atrial flutter: Secondary | ICD-10-CM | POA: Diagnosis not present

## 2019-03-16 DIAGNOSIS — I480 Paroxysmal atrial fibrillation: Secondary | ICD-10-CM

## 2019-03-16 DIAGNOSIS — N451 Epididymitis: Secondary | ICD-10-CM | POA: Diagnosis not present

## 2019-03-16 DIAGNOSIS — Z23 Encounter for immunization: Secondary | ICD-10-CM | POA: Diagnosis not present

## 2019-03-16 LAB — CBC WITH DIFFERENTIAL/PLATELET
Basophils Absolute: 0 10*3/uL (ref 0.0–0.1)
Basophils Relative: 0.7 % (ref 0.0–3.0)
Eosinophils Absolute: 0.1 10*3/uL (ref 0.0–0.7)
Eosinophils Relative: 1.4 % (ref 0.0–5.0)
HCT: 41.5 % (ref 39.0–52.0)
Hemoglobin: 14.2 g/dL (ref 13.0–17.0)
Lymphocytes Relative: 23 % (ref 12.0–46.0)
Lymphs Abs: 1.5 10*3/uL (ref 0.7–4.0)
MCHC: 34.1 g/dL (ref 30.0–36.0)
MCV: 90.4 fl (ref 78.0–100.0)
Monocytes Absolute: 0.7 10*3/uL (ref 0.1–1.0)
Monocytes Relative: 11.7 % (ref 3.0–12.0)
Neutro Abs: 4 10*3/uL (ref 1.4–7.7)
Neutrophils Relative %: 63.2 % (ref 43.0–77.0)
Platelets: 237 10*3/uL (ref 150.0–400.0)
RBC: 4.58 Mil/uL (ref 4.22–5.81)
RDW: 14.1 % (ref 11.5–15.5)
WBC: 6.4 10*3/uL (ref 4.0–10.5)

## 2019-03-16 LAB — LIPID PANEL
Cholesterol: 128 mg/dL (ref 0–200)
HDL: 36.4 mg/dL — ABNORMAL LOW (ref >39.00)
LDL Cholesterol: 79 mg/dL (ref 0–99)
NonHDL: 91.26
Total CHOL/HDL Ratio: 4
Triglycerides: 63 mg/dL (ref 0.0–149.0)
VLDL: 12.6 mg/dL (ref 0.0–40.0)

## 2019-03-16 LAB — BASIC METABOLIC PANEL
BUN: 12 mg/dL (ref 6–23)
CO2: 30 mEq/L (ref 19–32)
Calcium: 9.3 mg/dL (ref 8.4–10.5)
Chloride: 104 mEq/L (ref 96–112)
Creatinine, Ser: 1.09 mg/dL (ref 0.40–1.50)
GFR: 90.73 mL/min (ref >60.00)
Glucose, Bld: 90 mg/dL (ref 70–99)
Potassium: 4.2 mEq/L (ref 3.5–5.1)
Sodium: 139 mEq/L (ref 135–145)

## 2019-03-16 LAB — HEPATIC FUNCTION PANEL
ALT: 33 U/L (ref 0–53)
AST: 23 U/L (ref 0–37)
Albumin: 4.5 g/dL (ref 3.5–5.2)
Alkaline Phosphatase: 52 U/L (ref 39–117)
Bilirubin, Direct: 0.1 mg/dL (ref 0.0–0.3)
Total Bilirubin: 0.4 mg/dL (ref 0.2–1.2)
Total Protein: 7.6 g/dL (ref 6.0–8.3)

## 2019-03-16 LAB — URINALYSIS
Bilirubin Urine: NEGATIVE
Hgb urine dipstick: NEGATIVE
Leukocytes,Ua: NEGATIVE
Nitrite: NEGATIVE
Specific Gravity, Urine: 1.02 (ref 1.000–1.030)
Total Protein, Urine: NEGATIVE
Urine Glucose: NEGATIVE
Urobilinogen, UA: 1 (ref 0.0–1.0)
pH: 6.5 (ref 5.0–8.0)

## 2019-03-16 MED ORDER — TRIAMCINOLONE ACETONIDE 0.1 % EX CREA: 1.0000 "application " | TOPICAL_CREAM | Freq: Two times a day (BID) | CUTANEOUS | 1 refills | Status: DC

## 2019-03-16 MED ORDER — DOXYCYCLINE HYCLATE 100 MG PO TABS: 100.0000 mg | ORAL_TABLET | Freq: Two times a day (BID) | ORAL | 0 refills | Status: DC

## 2019-03-16 NOTE — Patient Instructions (Signed)

## 2019-03-16 NOTE — Assessment & Plan Note (Signed)
We discussed age appropriate health related issues, including available/recomended screening tests and vaccinations. We discussed a need for adhering to healthy diet and exercise. Labs were ordered  Loose wt

## 2019-03-16 NOTE — Assessment & Plan Note (Addendum)
L epididymus - painful, no mass Doxy [place]

## 2019-03-16 NOTE — Progress Notes (Signed)
Subjective:  Patient ID: Lance [name], male    DOB: 04-30-1981  Age: 38 y.o. MRN: [id number]  CC: No chief complaint on file.   HPI Lance [name] presents for a well exam C/o pain in the L testicle x 2-3 months Erection and movement makes it worse...  Outpatient Medications Prior to Visit  Medication Sig Dispense Refill  . amiodarone (PACERONE) 200 MG tablet Take 2 tablets by mouth twice daily 120 tablet 0  . fexofenadine (ALLEGRA) 30 MG tablet Take 30 mg by mouth daily.    . furosemide (LASIX) 40 MG tablet Take 1 tablet by mouth twice daily 180 tablet 0  . losartan (COZAAR) 25 MG tablet TAKE 1 TABLET BY MOUTH ONCE DAILY 90 tablet 3  . metoprolol succinate (TOPROL-XL) 25 MG 24 hr tablet Take 25 mg by mouth daily.    . potassium chloride SA (K-DUR) 20 MEQ tablet TAKE 1  BY MOUTH TWICE DAILY WITH FUROSEMIDE 60 tablet 0  . rivaroxaban (XARELTO) 20 MG TABS tablet TAKE 1 TABLET BY MOUTH ONCE DAILY WITH SUPPER 30 tablet 11  . cimetidine (TAGAMET) 800 MG tablet Take 800 mg by mouth at bedtime.     No facility-administered medications prior to visit.     ROS: Review of Systems  Constitutional: Positive for unexpected weight change. Negative for appetite change and fatigue.  HENT: Negative for congestion, nosebleeds, sneezing, sore throat and trouble swallowing.   Eyes: Negative for itching and visual disturbance.  Respiratory: Negative for cough.   Cardiovascular: Negative for chest pain, palpitations and leg swelling.  Gastrointestinal: Negative for abdominal distention, blood in stool, diarrhea and nausea.  Endocrine: Negative for polyuria.  Genitourinary: Positive for testicular pain. Negative for decreased urine volume, frequency, hematuria, penile swelling, scrotal swelling and urgency.  Musculoskeletal: Negative for back pain, gait problem, joint swelling and neck pain.  Skin: Negative for rash.  Neurological: Negative for dizziness, tremors, speech difficulty and weakness.   Psychiatric/Behavioral: Negative for agitation, dysphoric mood and sleep disturbance. The patient is not nervous/anxious.     Objective:  BP 118/62 (BP Location: Left Arm, Patient Position: Sitting, Cuff Size: Large)   Pulse (!) 57   Temp 97.6 F (36.4 C) (Oral)   Ht 6\' 2"  (1.88 m)   Wt (!) 309 lb (140.2 kg)   SpO2 96%   BMI 39.67 kg/m   BP Readings from Last 3 Encounters:  03/16/19 118/62  01/12/19 120/72  03/10/18 112/64    Wt Readings from Last 3 Encounters:  03/16/19 (!) 309 lb (140.2 kg)  01/12/19 (!) 302 lb 9.6 oz (137.3 kg)  03/10/18 295 lb (133.8 kg)    Physical Exam Constitutional:      General: He is not in acute distress.    Appearance: He is well-developed. He is obese.     Comments: NAD  Eyes:     Conjunctiva/sclera: Conjunctivae normal.     Pupils: Pupils are equal, round, and reactive to light.  Neck:     Musculoskeletal: Normal range of motion.     Thyroid: No thyromegaly.     Vascular: No JVD.  Cardiovascular:     Rate and Rhythm: Normal rate and regular rhythm.     Heart sounds: Normal heart sounds. No murmur. No friction rub. No gallop.   Pulmonary:     Effort: Pulmonary effort is normal. No respiratory distress.     Breath sounds: Normal breath sounds. No wheezing or rales.  Chest:     Chest  wall: No tenderness.  Abdominal:     General: Bowel sounds are normal. There is no distension.     Palpations: Abdomen is soft. There is no mass.     Tenderness: There is no abdominal tenderness. There is no guarding or rebound.  Musculoskeletal: Normal range of motion.        General: No tenderness.  Lymphadenopathy:     Cervical: No cervical adenopathy.  Skin:    General: Skin is warm and dry.     Findings: No rash.  Neurological:     Mental Status: He is alert and oriented to person, place, and time.     Cranial Nerves: No cranial nerve deficit.     Motor: No abnormal muscle tone.     Coordination: Coordination normal.     Gait: Gait normal.      Deep Tendon Reflexes: Reflexes are normal and symmetric.  Psychiatric:        Behavior: Behavior normal.        Thought Content: Thought content normal.        Judgment: Judgment normal.   L epididymus - painful, no mass  Lab Results  Component Value Date   WBC 6.5 01/12/2019   HGB 14.7 01/12/2019   HCT 43.9 01/12/2019   PLT 268 01/12/2019   GLUCOSE 105 (H) 01/12/2019   CHOL 134 03/10/2018   TRIG 116.0 03/10/2018   HDL 38.20 (L) 03/10/2018   LDLCALC 73 03/10/2018   ALT 34 01/12/2019   AST 27 01/12/2019   NA 137 01/12/2019   K 3.8 01/12/2019   CL 104 01/12/2019   CREATININE 1.31 (H) 01/12/2019   BUN 16 01/12/2019   CO2 22 01/12/2019   TSH 0.997 01/12/2019   PSA 0.45 02/01/2013   INR 1.07 01/01/2016    No results found.  Assessment & Plan:   There are no diagnoses linked to this encounter.   No orders of the defined types were placed in this encounter.    Follow-up: No follow-ups on file.  Walker Kehr, MD

## 2019-03-17 LAB — HIV ANTIBODY (ROUTINE TESTING W REFLEX): HIV 1&2 Ab, 4th Generation: NONREACTIVE

## 2019-03-24 ENCOUNTER — Other Ambulatory Visit (HOSPITAL_COMMUNITY): Payer: Self-pay | Admitting: Cardiology

## 2019-03-24 ENCOUNTER — Telehealth (HOSPITAL_COMMUNITY): Payer: Self-pay | Admitting: Emergency Medicine

## 2019-03-24 NOTE — Telephone Encounter (Signed)
Reaching out to patient to offer assistance regarding upcoming cardiac imaging study; pt verbalizes understanding of appt date/time, parking situation and where to check in, pre-test NPO status and medications ordered, and verified current allergies; name and call back number provided for further questions should they arise Shalese Strahan RN Navigator Cardiac Imaging [facility] [phone] office [phone] cell 

## 2019-03-27 ENCOUNTER — Other Ambulatory Visit: Payer: BC Managed Care – PPO | Admitting: *Deleted

## 2019-03-27 ENCOUNTER — Other Ambulatory Visit: Payer: Self-pay

## 2019-03-27 ENCOUNTER — Other Ambulatory Visit (HOSPITAL_COMMUNITY)
Admission: RE | Admit: 2019-03-27 | Discharge: 2019-03-27 | Disposition: A | Discharge status: Home / Self Care | Payer: BC Managed Care – PPO | Source: Ambulatory Visit | Attending: Internal Medicine | Admitting: Internal Medicine

## 2019-03-27 ENCOUNTER — Ambulatory Visit (HOSPITAL_COMMUNITY)
Admission: RE | Admit: 2019-03-27 | Discharge: 2019-03-27 | Disposition: A | Discharge status: Home / Self Care | Payer: BC Managed Care – PPO | Source: Ambulatory Visit | Attending: Internal Medicine | Admitting: Internal Medicine

## 2019-03-27 DIAGNOSIS — Z01812 Encounter for preprocedural laboratory examination: Secondary | ICD-10-CM | POA: Insufficient documentation

## 2019-03-27 DIAGNOSIS — I480 Paroxysmal atrial fibrillation: Secondary | ICD-10-CM

## 2019-03-27 DIAGNOSIS — Z20828 Contact with and (suspected) exposure to other viral communicable diseases: Secondary | ICD-10-CM | POA: Diagnosis not present

## 2019-03-27 MED ORDER — IOHEXOL 350 MG/ML SOLN
80.0000 mL | Freq: Once | INTRAVENOUS | Status: AC | PRN
  Administered 2019-03-27: 80 mL via INTRAVENOUS

## 2019-03-28 LAB — CBC WITH DIFFERENTIAL/PLATELET
Basophils Absolute: 0 10*3/uL (ref 0.0–0.2)
Basos: 0 %
EOS (ABSOLUTE): 0.1 10*3/uL (ref 0.0–0.4)
Eos: 1 %
Hematocrit: 47.4 % (ref 37.5–51.0)
Hemoglobin: 15.4 g/dL (ref 13.0–17.7)
Immature Grans (Abs): 0 10*3/uL (ref 0.0–0.1)
Immature Granulocytes: 0 %
Lymphocytes Absolute: 1.8 10*3/uL (ref 0.7–3.1)
Lymphs: 25 %
MCH: 30 pg (ref 26.6–33.0)
MCHC: 32.5 g/dL (ref 31.5–35.7)
MCV: 92 fL (ref 79–97)
Monocytes Absolute: 0.6 10*3/uL (ref 0.1–0.9)
Monocytes: 8 %
Neutrophils Absolute: 4.6 10*3/uL (ref 1.4–7.0)
Neutrophils: 66 %
Platelets: 267 10*3/uL (ref 150–450)
RBC: 5.13 x10E6/uL (ref 4.14–5.80)
RDW: 13.3 % (ref 11.6–15.4)
WBC: 7.1 10*3/uL (ref 3.4–10.8)

## 2019-03-28 LAB — BASIC METABOLIC PANEL
BUN/Creatinine Ratio: 15 (ref 9–20)
BUN: 18 mg/dL (ref 6–20)
CO2: 23 mmol/L (ref 20–29)
Calcium: 9.6 mg/dL (ref 8.7–10.2)
Chloride: 102 mmol/L (ref 96–106)
Creatinine, Ser: 1.23 mg/dL (ref 0.76–1.27)
GFR calc Af Amer: 85 mL/min/{1.73_m2} (ref >59)
GFR calc non Af Amer: 73 mL/min/{1.73_m2} (ref >59)
Glucose: 83 mg/dL (ref 65–99)
Potassium: 4.4 mmol/L (ref 3.5–5.2)
Sodium: 140 mmol/L (ref 134–144)

## 2019-03-28 LAB — NOVEL CORONAVIRUS, NAA (HOSP ORDER, SEND-OUT TO REF LAB; TAT 18-24 HRS): SARS-CoV-2, NAA: NOT DETECTED

## 2019-03-29 NOTE — Anesthesia Preprocedure Evaluation (Deleted)
Anesthesia Evaluation  Patient identified by MRN, date of birth, ID band Patient awake    Reviewed: Allergy & Precautions, NPO status , Patient's Chart, lab work & pertinent test results  Airway Mallampati: I       Dental no notable dental hx. (+) Teeth Intact   Pulmonary sleep apnea and Continuous Positive Airway Pressure Ventilation , former smoker,    Pulmonary exam normal breath sounds clear to auscultation       Cardiovascular hypertension, Pt. on home beta blockers + Peripheral Vascular Disease  + dysrhythmias Atrial Fibrillation  Rhythm:Irregular Rate:Normal     Neuro/Psych negative psych ROS   GI/Hepatic negative GI ROS, Neg liver ROS,   Endo/Other  negative endocrine ROSMorbid obesity  Renal/GU negative Renal ROS  negative genitourinary   Musculoskeletal   Abdominal (+) + obese,   Peds  Hematology negative hematology ROS (+)   Anesthesia Other Findings   Reproductive/Obstetrics                                                              Anesthesia Evaluation  Patient identified by MRN, date of birth, ID band Patient awake    Reviewed: Allergy & Precautions, H&P , NPO status , Patient's Chart, lab work & pertinent test results  Airway Mallampati: I  TM Distance: >3 FB Neck ROM: Full    Dental no notable dental hx. (+) Dental Advisory Given   Pulmonary sleep apnea and Continuous Positive Airway Pressure Ventilation , former smoker,  Sever OSA.  Better with CPAP 14cm pressure   Pulmonary exam normal        Cardiovascular Normal cardiovascular exam+ dysrhythmias Atrial Fibrillation  Rhythm:Irregular Rate:Normal  Long hx of AF.  EF has been as low as 10% when fast vent rate. Now about 50-55%, on Amiododeone   Neuro/Psych    GI/Hepatic   Endo/Other  Morbid obesity  Renal/GU      Musculoskeletal   Abdominal (+) + obese,   Peds  Hematology    Anesthesia Other Findings   Reproductive/Obstetrics                            Anesthesia Physical  Anesthesia Plan  ASA: III  Anesthesia Plan: MAC   Post-op Pain Management:    Induction: Intravenous  Airway Management Planned: Simple Face Mask  Additional Equipment:   Intra-op Plan:   Post-operative Plan:   Informed Consent: I have reviewed the patients History and Physical, chart, labs and discussed the procedure including the risks, benefits and alternatives for the proposed anesthesia with the patient or authorized representative who has indicated his/her understanding and acceptance.     Plan Discussed with:   Anesthesia Plan Comments:         Anesthesia Quick Evaluation  Anesthesia Physical  Anesthesia Plan  ASA: III  Anesthesia Plan: General   Post-op Pain Management:    Induction: Intravenous  PONV Risk Score and Plan: 2 and Ondansetron and Dexamethasone  Airway Management Planned: Oral ETT and LMA  Additional Equipment:   Intra-op Plan:   Post-operative Plan: Extubation in OR  Informed Consent: I have reviewed the patients History and Physical, chart, labs and discussed the procedure including the risks, benefits and alternatives for the proposed anesthesia  with the patient or authorized representative who has indicated his/her understanding and acceptance.       Plan Discussed with: CRNA, Surgeon and Anesthesiologist  Anesthesia Plan Comments: (  )        Anesthesia Quick Evaluation

## 2019-03-29 NOTE — Anesthesia Preprocedure Evaluation (Signed)
Anesthesia Evaluation  Patient identified by MRN, date of birth, ID band Patient awake    Reviewed: Allergy & Precautions, H&P , NPO status , Patient's Chart, lab work & pertinent test results, reviewed documented beta blocker date and time   Airway Mallampati: II  TM Distance: >3 FB Neck ROM: full    Dental no notable dental hx.    Pulmonary sleep apnea and Continuous Positive Airway Pressure Ventilation , former smoker,    Pulmonary exam normal breath sounds clear to auscultation       Cardiovascular Exercise Tolerance: Good + Peripheral Vascular Disease  + dysrhythmias Atrial Fibrillation  Rhythm:regular Rate:Normal  Long hx of AF.  EF has been as low as 10% when fast vent rate. Now about 50-55%, on Amiododeone  ECHO 19'  Left Ventricle: The left ventricle has normal systolic function, with an ejection fraction of 55-60%. The cavity size was normal. There is no increase in left ventricular wall thickness. Left ventricular diastolic parameters were normal. Normal left  ventricular filling pressures   Neuro/Psych negative neurological ROS  negative psych ROS   GI/Hepatic negative GI ROS, Neg liver ROS,   Endo/Other  negative endocrine ROS  Renal/GU negative Renal ROS  negative genitourinary   Musculoskeletal   Abdominal   Peds  Hematology negative hematology ROS (+)   Anesthesia Other Findings   Reproductive/Obstetrics negative OB ROS                             Anesthesia Physical Anesthesia Plan  ASA: III  Anesthesia Plan: General   Post-op Pain Management:    Induction: Intravenous  PONV Risk Score and Plan: 2 and Ondansetron, Dexamethasone and Treatment may vary due to age or medical condition  Airway Management Planned: Oral ETT and LMA  Additional Equipment:   Intra-op Plan:   Post-operative Plan: Extubation in OR  Informed Consent: I have reviewed the patients  History and Physical, chart, labs and discussed the procedure including the risks, benefits and alternatives for the proposed anesthesia with the patient or authorized representative who has indicated his/her understanding and acceptance.     Dental Advisory Given  Plan Discussed with: CRNA, Anesthesiologist and Surgeon  Anesthesia Plan Comments: (  )        Anesthesia Quick Evaluation

## 2019-03-30 ENCOUNTER — Ambulatory Visit (HOSPITAL_COMMUNITY)
Admission: RE | Admit: 2019-03-30 | Discharge: 2019-03-30 | Disposition: A | Discharge status: Home / Self Care | Payer: BC Managed Care – PPO | Attending: Internal Medicine | Admitting: Internal Medicine

## 2019-03-30 ENCOUNTER — Ambulatory Visit (HOSPITAL_COMMUNITY): Payer: BC Managed Care – PPO | Admitting: Certified Registered"

## 2019-03-30 ENCOUNTER — Other Ambulatory Visit: Payer: Self-pay

## 2019-03-30 ENCOUNTER — Encounter (HOSPITAL_COMMUNITY)
Admission: RE | Disposition: A | Discharge status: Home / Self Care | Payer: Self-pay | Source: Home / Self Care | Attending: Internal Medicine

## 2019-03-30 DIAGNOSIS — I480 Paroxysmal atrial fibrillation: Secondary | ICD-10-CM | POA: Diagnosis not present

## 2019-03-30 DIAGNOSIS — I739 Peripheral vascular disease, unspecified: Secondary | ICD-10-CM | POA: Diagnosis not present

## 2019-03-30 DIAGNOSIS — I5022 Chronic systolic (congestive) heart failure: Secondary | ICD-10-CM | POA: Diagnosis not present

## 2019-03-30 DIAGNOSIS — I4892 Unspecified atrial flutter: Secondary | ICD-10-CM | POA: Diagnosis not present

## 2019-03-30 DIAGNOSIS — Z8249 Family history of ischemic heart disease and other diseases of the circulatory system: Secondary | ICD-10-CM | POA: Diagnosis not present

## 2019-03-30 DIAGNOSIS — G4733 Obstructive sleep apnea (adult) (pediatric): Secondary | ICD-10-CM | POA: Diagnosis not present

## 2019-03-30 DIAGNOSIS — Z7901 Long term (current) use of anticoagulants: Secondary | ICD-10-CM | POA: Insufficient documentation

## 2019-03-30 DIAGNOSIS — Z87891 Personal history of nicotine dependence: Secondary | ICD-10-CM | POA: Diagnosis not present

## 2019-03-30 DIAGNOSIS — Z79899 Other long term (current) drug therapy: Secondary | ICD-10-CM | POA: Diagnosis not present

## 2019-03-30 DIAGNOSIS — Z881 Allergy status to other antibiotic agents status: Secondary | ICD-10-CM | POA: Diagnosis not present

## 2019-03-30 DIAGNOSIS — G709 Myoneural disorder, unspecified: Secondary | ICD-10-CM | POA: Diagnosis not present

## 2019-03-30 DIAGNOSIS — Z888 Allergy status to other drugs, medicaments and biological substances status: Secondary | ICD-10-CM | POA: Insufficient documentation

## 2019-03-30 DIAGNOSIS — I428 Other cardiomyopathies: Secondary | ICD-10-CM | POA: Diagnosis not present

## 2019-03-30 DIAGNOSIS — I5021 Acute systolic (congestive) heart failure: Secondary | ICD-10-CM | POA: Diagnosis not present

## 2019-03-30 HISTORY — PX: ATRIAL FIBRILLATION ABLATION: EP1191

## 2019-03-30 LAB — POCT ACTIVATED CLOTTING TIME
Activated Clotting Time: 241 seconds
Activated Clotting Time: 279 seconds

## 2019-03-30 SURGERY — ATRIAL FIBRILLATION ABLATION
Anesthesia: General

## 2019-03-30 MED ORDER — ONDANSETRON HCL 4 MG/2ML IJ SOLN: 4.0000 mg | Freq: Once | INTRAMUSCULAR | Status: DC | PRN

## 2019-03-30 MED ORDER — HEPARIN SODIUM (PORCINE) 1000 UNIT/ML IJ SOLN
INTRAMUSCULAR | Status: DC | PRN
  Administered 2019-03-30: 5000 [IU] via INTRAVENOUS

## 2019-03-30 MED ORDER — ACETAMINOPHEN 160 MG/5ML PO SOLN: 325.0000 mg | ORAL | Status: DC | PRN

## 2019-03-30 MED ORDER — SODIUM CHLORIDE 0.9 % IV SOLN
INTRAVENOUS | Status: DC
  Administered 2019-03-30 (×2): via INTRAVENOUS

## 2019-03-30 MED ORDER — ONDANSETRON HCL 4 MG/2ML IJ SOLN: 4.0000 mg | Freq: Four times a day (QID) | INTRAMUSCULAR | Status: DC | PRN

## 2019-03-30 MED ORDER — ACETAMINOPHEN 325 MG PO TABS: 650.0000 mg | ORAL_TABLET | ORAL | Status: DC | PRN

## 2019-03-30 MED ORDER — HEPARIN SODIUM (PORCINE) 1000 UNIT/ML IJ SOLN
INTRAMUSCULAR | Status: DC | PRN
  Administered 2019-03-30: 1000 [IU] via INTRAVENOUS
  Administered 2019-03-30: 14000 [IU] via INTRAVENOUS

## 2019-03-30 MED ORDER — ACETAMINOPHEN 325 MG PO TABS: 325.0000 mg | ORAL_TABLET | ORAL | Status: DC | PRN

## 2019-03-30 MED ORDER — ISOPROTERENOL HCL 0.2 MG/ML IJ SOLN
INTRAMUSCULAR | Status: AC
  Filled 2019-03-30: qty 5

## 2019-03-30 MED ORDER — FENTANYL CITRATE (PF) 100 MCG/2ML IJ SOLN: 25.0000 ug | INTRAMUSCULAR | Status: DC | PRN

## 2019-03-30 MED ORDER — SUGAMMADEX SODIUM 200 MG/2ML IV SOLN
INTRAVENOUS | Status: DC | PRN
  Administered 2019-03-30: 300 mg via INTRAVENOUS

## 2019-03-30 MED ORDER — HYDROCODONE-ACETAMINOPHEN 5-325 MG PO TABS
1.0000 | ORAL_TABLET | ORAL | Status: DC | PRN
  Administered 2019-03-30 (×2): 1 via ORAL

## 2019-03-30 MED ORDER — OXYCODONE HCL 5 MG/5ML PO SOLN: 5.0000 mg | Freq: Once | ORAL | Status: DC | PRN

## 2019-03-30 MED ORDER — SUCCINYLCHOLINE CHLORIDE 20 MG/ML IJ SOLN
INTRAMUSCULAR | Status: DC | PRN
  Administered 2019-03-30: 160 mg via INTRAVENOUS

## 2019-03-30 MED ORDER — SODIUM CHLORIDE 0.9% FLUSH: 3.0000 mL | INTRAVENOUS | Status: DC | PRN

## 2019-03-30 MED ORDER — HYDROCODONE-ACETAMINOPHEN 5-325 MG PO TABS
ORAL_TABLET | ORAL | Status: AC
  Filled 2019-03-30: qty 1

## 2019-03-30 MED ORDER — SODIUM CHLORIDE 0.9 % IV SOLN
INTRAVENOUS | Status: DC | PRN
  Administered 2019-03-30: 25 ug/min via INTRAVENOUS

## 2019-03-30 MED ORDER — PROPOFOL 10 MG/ML IV BOLUS
INTRAVENOUS | Status: DC | PRN
  Administered 2019-03-30: 200 mg via INTRAVENOUS

## 2019-03-30 MED ORDER — SODIUM CHLORIDE 0.9% FLUSH: 3.0000 mL | Freq: Two times a day (BID) | INTRAVENOUS | Status: DC

## 2019-03-30 MED ORDER — BUPIVACAINE HCL (PF) 0.25 % IJ SOLN
INTRAMUSCULAR | Status: DC | PRN
  Administered 2019-03-30: 20 mL

## 2019-03-30 MED ORDER — LIDOCAINE 2% (20 MG/ML) 5 ML SYRINGE
INTRAMUSCULAR | Status: DC | PRN
  Administered 2019-03-30: 100 mg via INTRAVENOUS

## 2019-03-30 MED ORDER — RIVAROXABAN 20 MG PO TABS
20.0000 mg | ORAL_TABLET | Freq: Once | ORAL | Status: AC
  Administered 2019-03-30: 20 mg via ORAL
  Filled 2019-03-30: qty 1

## 2019-03-30 MED ORDER — HEPARIN (PORCINE) IN NACL 1000-0.9 UT/500ML-% IV SOLN
INTRAVENOUS | Status: DC | PRN
  Administered 2019-03-30: 500 mL

## 2019-03-30 MED ORDER — BUPIVACAINE HCL (PF) 0.25 % IJ SOLN
INTRAMUSCULAR | Status: AC
  Filled 2019-03-30: qty 30

## 2019-03-30 MED ORDER — ISOPROTERENOL HCL 0.2 MG/ML IJ SOLN
INTRAVENOUS | Status: DC | PRN
  Administered 2019-03-30: 20 ug/min via INTRAVENOUS

## 2019-03-30 MED ORDER — ROCURONIUM BROMIDE 10 MG/ML (PF) SYRINGE
PREFILLED_SYRINGE | INTRAVENOUS | Status: DC | PRN
  Administered 2019-03-30: 60 mg via INTRAVENOUS

## 2019-03-30 MED ORDER — PHENYLEPHRINE 40 MCG/ML (10ML) SYRINGE FOR IV PUSH (FOR BLOOD PRESSURE SUPPORT)
PREFILLED_SYRINGE | INTRAVENOUS | Status: DC | PRN
  Administered 2019-03-30: 80 ug via INTRAVENOUS

## 2019-03-30 MED ORDER — SODIUM CHLORIDE 0.9 % IV SOLN: 250.0000 mL | INTRAVENOUS | Status: DC | PRN

## 2019-03-30 MED ORDER — HEPARIN (PORCINE) IN NACL 1000-0.9 UT/500ML-% IV SOLN
INTRAVENOUS | Status: AC
  Filled 2019-03-30: qty 500

## 2019-03-30 MED ORDER — HEPARIN SODIUM (PORCINE) 1000 UNIT/ML IJ SOLN
INTRAMUSCULAR | Status: AC
  Filled 2019-03-30: qty 2

## 2019-03-30 MED ORDER — FENTANYL CITRATE (PF) 250 MCG/5ML IJ SOLN
INTRAMUSCULAR | Status: DC | PRN
  Administered 2019-03-30 (×2): 50 ug via INTRAVENOUS

## 2019-03-30 MED ORDER — DEXAMETHASONE SODIUM PHOSPHATE 10 MG/ML IJ SOLN
INTRAMUSCULAR | Status: DC | PRN
  Administered 2019-03-30: 10 mg via INTRAVENOUS

## 2019-03-30 MED ORDER — OXYCODONE HCL 5 MG PO TABS: 5.0000 mg | ORAL_TABLET | Freq: Once | ORAL | Status: DC | PRN

## 2019-03-30 MED ORDER — ONDANSETRON HCL 4 MG/2ML IJ SOLN
INTRAMUSCULAR | Status: DC | PRN
  Administered 2019-03-30: 4 mg via INTRAVENOUS

## 2019-03-30 MED ORDER — MEPERIDINE HCL 25 MG/ML IJ SOLN: 6.2500 mg | INTRAMUSCULAR | Status: DC | PRN

## 2019-03-30 MED ORDER — MIDAZOLAM HCL 5 MG/5ML IJ SOLN
INTRAMUSCULAR | Status: DC | PRN
  Administered 2019-03-30 (×2): 1 mg via INTRAVENOUS

## 2019-03-30 SURGICAL SUPPLY — 17 items
BLANKET WARM UNDERBOD FULL ACC (MISCELLANEOUS) ×2 IMPLANT
CATH MAPPNG PENTARAY F 2-6-2MM (CATHETERS) ×1 IMPLANT
CATH SMTCH THERMOCOOL SF DF (CATHETERS) ×2 IMPLANT
CATH SOUNDSTAR ECO REPROCESSED (CATHETERS) ×2 IMPLANT
CATH WEBSTER BI DIR CS D-F CRV (CATHETERS) ×2 IMPLANT
COVER SWIFTLINK CONNECTOR (BAG) ×2 IMPLANT
NEEDLE BAYLIS TRANSSEPTAL 71CM (NEEDLE) ×2 IMPLANT
PACK EP LATEX FREE (CUSTOM PROCEDURE TRAY) ×1
PACK EP LF (CUSTOM PROCEDURE TRAY) ×1 IMPLANT
PAD PRO RADIOLUCENT 2001M-C (PAD) ×2 IMPLANT
PATCH CARTO3 (PAD) ×2 IMPLANT
PENTARAY F 2-6-2MM (CATHETERS) ×2
SHEATH AVANTI 11F 11CM (SHEATH) ×2 IMPLANT
SHEATH PINNACLE 7F 10CM (SHEATH) ×4 IMPLANT
SHEATH PROBE COVER 6X72 (BAG) ×2 IMPLANT
SHEATH SWARTZ TS SL2 63CM 8.5F (SHEATH) ×2 IMPLANT
TUBING SMART ABLATE COOLFLOW (TUBING) ×2 IMPLANT

## 2019-03-30 NOTE — Progress Notes (Signed)
Rt groin 3-way stopcock loosened and removed. No oozing. Suture removed. No oozing. Gauze and tegaderm dressing. Instructions reviewed w/patient. Level 0

## 2019-03-30 NOTE — Progress Notes (Signed)
Loosened rt groin 3 way stopcock and site immediately began to ooze. Retightened stopcock, no further oozing. Level 0. Tender

## 2019-03-30 NOTE — Anesthesia Procedure Notes (Signed)
Procedure Name: Intubation Date/Time: 03/30/2019 7:52 AM Performed by: Gaylene Brooks, CRNA Pre-anesthesia Checklist: Patient identified, Emergency Drugs available, Suction available and Patient being monitored Patient Re-evaluated:Patient Re-evaluated prior to induction Oxygen Delivery Method: Circle System Utilized Preoxygenation: Pre-oxygenation with 100% oxygen Induction Type: IV induction Ventilation: Mask ventilation without difficulty and Two handed mask ventilation required Laryngoscope Size: Miller and 2 Grade View: Grade I Tube type: Oral Tube size: 7.5 mm Number of attempts: 1 Airway Equipment and Method: Stylet and Oral airway Placement Confirmation: ETT inserted through vocal cords under direct vision,  positive ETCO2 and breath sounds checked- equal and bilateral Secured at: 24 cm Tube secured with: Tape Dental Injury: Teeth and Oropharynx as per pre-operative assessment

## 2019-03-30 NOTE — Discharge Instructions (Signed)
Femoral Site Care °This sheet gives you information about how to care for yourself after your procedure. Your health care provider may also give you more specific instructions. If you have problems or questions, contact your health care provider. °What can I expect after the procedure? °After the procedure, it is common to have: °· Bruising that usually fades within 1-2 weeks. °· Tenderness at the site. °Follow these instructions at home: °Wound care °· Follow instructions from your health care provider about how to take care of your insertion site. Make sure you: °? Wash your hands with soap and water before you change your bandage (dressing). If soap and water are not available, use hand sanitizer. °? Change your dressing as told by your health care provider. °? Leave stitches (sutures), skin glue, or adhesive strips in place. These skin closures may need to stay in place for 2 weeks or longer. If adhesive strip edges start to loosen and curl up, you may trim the loose edges. Do not remove adhesive strips completely unless your health care provider tells you to do that. °· Do not take baths, swim, or use a hot tub until your health care provider approves. °· You may shower 24-48 hours after the procedure or as told by your health care provider. °? Gently wash the site with plain soap and water. °? Pat the area dry with a clean towel. °? Do not rub the site. This may cause bleeding. °· Do not apply powder or lotion to the site. Keep the site clean and dry. °· Check your femoral site every day for signs of infection. Check for: °? Redness, swelling, or pain. °? Fluid or blood. °? Warmth. °? Pus or a bad smell. °Activity °· For the first 2-3 days after your procedure, or as long as directed: °? Avoid climbing stairs as much as possible. °? Do not squat. °· Do not lift anything that is heavier than 10 lb (4.5 kg), or the limit that you are told, until your health care provider says that it is safe. °· Rest as  directed. °? Avoid sitting for a long time without moving. Get up to take short walks every 1-2 hours. °· Do not drive for 24 hours if you were given a medicine to help you relax (sedative). °General instructions °· Take over-the-counter and prescription medicines only as told by your health care provider. °· Keep all follow-up visits as told by your health care provider. This is important. °Contact a health care provider if you have: °· A fever or chills. °· You have redness, swelling, or pain around your insertion site. °Get help right away if: °· The catheter insertion area swells very fast. °· You pass out. °· You suddenly start to sweat or your skin gets clammy. °· The catheter insertion area is bleeding, and the bleeding does not stop when you hold steady pressure on the area. °· The area near or just beyond the catheter insertion site becomes pale, cool, tingly, or numb. °These symptoms may represent a serious problem that is an emergency. Do not wait to see if the symptoms will go away. Get medical help right away. Call your local emergency services (911 in the [place] Do not drive yourself to the hospital. °Summary °· After the procedure, it is common to have bruising that usually fades within 1-2 weeks. °· Check your femoral site every day for signs of infection. °· Do not lift anything that is heavier than 10 lb (4.5 kg), or the   limit that you are told, until your health care provider says that it is safe. This information is not intended to replace advice given to you by your health care provider. Make sure you discuss any questions you have with your health care provider. Document Released: 01/26/2014 Document Revised: 06/07/2017 Document Reviewed: 06/07/2017 Elsevier Patient Education  2020 [place] procedure care instructions No driving for 4 days. No lifting over 5 lbs for 1 week. No vigorous or sexual activity for 1 week. You may return to work on 04/06/2019. Keep procedure site clean  & dry. If you notice increased pain, swelling, bleeding or pus, call/return!  You may shower, but no soaking baths/hot tubs/pools for 1 week.    You have an appointment set up with the [facility].  Multiple studies have shown that being followed by a dedicated atrial fibrillation clinic in addition to the standard care you receive from your other physicians improves health. We believe that enrollment in the atrial fibrillation clinic will allow [place] to better care for you.   The phone number to the [facility] is [phone]. The clinic is staffed Monday through Friday from 8:30am to 5pm.  Parking Directions: The clinic is located in the Heart and Vascular Building connected to [facility]. 1)From [address] turn on to [address] and go to the 3rd entrance  (Heart and Vascular entrance) on the right. 2)Look to the right for Heart &Vascular Parking Garage. 3)A code for the entrance is required please call the clinic to receive this.   4)Take the elevators to the 1st floor. Registration is in the room with the glass walls at the end of the hallway.  If you have any trouble parking or locating the clinic, please dont hesitate to call [phone].

## 2019-03-30 NOTE — Progress Notes (Signed)
Ambulated in hallway and to the bathroom to void tol well.  

## 2019-03-30 NOTE — Progress Notes (Signed)
Discharge instructions reviewed with pt and Janett Billow both voice understanding.

## 2019-03-30 NOTE — H&P (Signed)
Chief Complaint:  palpitations  History of Present Illness:    Lance [name] is a 38 y.o. male who presents for afib ablation.  Since last being seen in our clinic, the patient reports doing reasonably well.  He continues to have increasing episodes of afib.   He has palpitations and fatigue with his afib.  Today, he denies symptoms of chest pain, shortness of breath,  lower extremity edema, dizziness, presyncope, or syncope.  The patient is otherwise without complaint today.  The patient denies symptoms of fevers, chills, cough, or new SOB worrisome for COVID 19.      Past Medical History:  Diagnosis Date  . Acute systolic heart failure (HCC) 09/14/2012  . Atrial flutter [facility])    s/p ablation by Dr [name]  . Medical history non-contributory   . Neuromuscular disorder (HCC)   . Onychomycosis   . OSA on CPAP   . Peripheral vascular disease (HCC)   . Persistent atrial fibrillation 2003         Past Surgical History:  Procedure Laterality Date  . ATRIAL FIBRILLATION ABLATION N/A 02/15/2014   Procedure: ATRIAL FIBRILLATION ABLATION;  Surgeon: Gardiner Rhyme, MD;  Location: [facility] CATH LAB;  Service: Cardiovascular;  Laterality: N/A;  . ATRIAL FLUTTER ABLATION  09/14/2012   by Dr [name]  . ATRIAL FLUTTER ABLATION N/A 09/14/2012   Procedure: ATRIAL FLUTTER ABLATION;  Surgeon: Marinus Maw, MD;  Location: [facility] CATH LAB;  Service: Cardiovascular;  Laterality: N/A;  . CARDIAC CATHETERIZATION    . CARDIOVERSION N/A 08/14/2012   Procedure: CARDIOVERSION;  Surgeon: Dolores Patty, MD;  Location: [facility] OR;  Service: Cardiovascular;  Laterality: N/A;  . CARDIOVERSION N/A 01/03/2016   Procedure: CARDIOVERSION;  Surgeon: Pricilla Riffle, MD;  Location: [facility] ENDOSCOPY;  Service: Cardiovascular;  Laterality: N/A;  . CARDIOVERSION N/A 03/08/2017   Procedure: CARDIOVERSION;  Surgeon: Dolores Patty, MD;  Location: [facility] ENDOSCOPY;  Service: Cardiovascular;  Laterality: N/A;  . MANDIBLE SURGERY      underbite correction  . TEE WITHOUT CARDIOVERSION N/A 02/14/2014   Procedure: TRANSESOPHAGEAL ECHOCARDIOGRAM (TEE);  Surgeon: Quintella Reichert, MD;  Location: [facility] ENDOSCOPY;  Service: Cardiovascular;  Laterality: N/A;  . TEE WITHOUT CARDIOVERSION N/A 01/03/2016   Procedure: TRANSESOPHAGEAL ECHOCARDIOGRAM (TEE);  Surgeon: Pricilla Riffle, MD;  Location: [facility] ENDOSCOPY;  Service: Cardiovascular;  Laterality: N/A;          Current Outpatient Medications  Medication Sig Dispense Refill  . amiodarone (PACERONE) 200 MG tablet Take 2 tablets by mouth twice daily 120 tablet 0  . cimetidine (TAGAMET) 800 MG tablet Take 800 mg by mouth at bedtime.    . fexofenadine (ALLEGRA) 30 MG tablet Take 30 mg by mouth daily.    . furosemide (LASIX) 40 MG tablet Take 1 tablet by mouth twice daily 180 tablet 0  . losartan (COZAAR) 25 MG tablet TAKE 1 TABLET BY MOUTH ONCE DAILY 90 tablet 3  . metoprolol succinate (TOPROL-XL) 25 MG 24 hr tablet Take 25 mg by mouth daily.    . potassium chloride SA (K-DUR) 20 MEQ tablet TAKE 1  BY MOUTH TWICE DAILY WITH FUROSEMIDE 60 tablet 0  . rivaroxaban (XARELTO) 20 MG TABS tablet TAKE 1 TABLET BY MOUTH ONCE DAILY WITH SUPPER 30 tablet 11   No current facility-administered medications for this visit.     Allergies:   Amiodarone, Altace [ramipril], Azithromycin, Protonix [pantoprazole sodium], Fish allergy, and Metoprolol succinate   Social History:  The patient  reports  that he quit smoking about 9 years ago. His smoking use included cigarettes and cigars. He quit after 3.00 years of use. He has never used smokeless tobacco. He reports current alcohol use. He reports current drug use. Drug: Marijuana.   Family History:  The patient's family history includes Alcohol abuse in his father; Depression in his father; Diabetes in his mother; Hypertension in his mother; Osteoarthritis in his father; Varicose Veins in his mother.   ROS:  Please see the history of present  illness.   All other systems are personally reviewed and negative.    Physical Exam: Vitals:   03/30/19 0546  BP: 127/63  Pulse: 67  Resp: 18  Temp: 97.8 F (36.6 C)  TempSrc: Oral  SpO2: 100%  Weight: 136.1 kg  Height: 6\' 2"  (1.88 m)    GEN- The patient is well appearing, alert and oriented x 3 today.   Head- normocephalic, atraumatic Eyes-  Sclera clear, conjunctiva pink Ears- hearing intact Oropharynx- clear Neck- supple, Lungs-  normal work of breathing Heart- irregular rate and rhythm  GI- soft, NT, ND, + BS Extremities- no clubbing, cyanosis, or edema, groin is without hematoma/ bruit     Labs/Other Tests and Data Reviewed:    Recent Labs: 01/12/2019: ALT 34; BUN 16; Creatinine, Ser 1.31; Hemoglobin 14.7; Platelets 268; Potassium 3.8; Sodium 137; TSH 0.997      Wt Readings from Last 3 Encounters:  01/12/19 (!) 302 lb 9.6 oz (137.3 kg)  03/10/18 295 lb (133.8 kg)  01/25/18 288 lb (130.6 kg)    Recent Zio monitor reviewed   ASSESSMENT & PLAN:    1.  afib The patient has symptomatic, recurrent paroxysmal atrial fibrillation. He is s/p CTI ablation by Dr Lovena Le in 2014 and PVI by me in 2015. he has failed medical therapy with tikosyn (polymorphic VT) and amiodarone (AF recurrence).  he is anticoagulated with xarelto Therapeutic strategies for afib including medicine and ablation were discussed in detail with the patient today. Risk, benefits, and alternatives to repeat EP study and radiofrequency ablation for afib were also discussed in detail today. These risks include but are not limited to stroke, bleeding, vascular damage, tamponade, perforation, damage to the esophagus, lungs, and other structures, pulmonary vein stenosis, worsening renal function, and death. The patient understands these risk and wishes to proceed.    Cardiac CT reviewed with him today. He reports compliance with xarelto without interruption.  Thompson Grayer MD, [place] 03/30/2019 7:27 AM

## 2019-03-30 NOTE — Transfer of Care (Signed)
Immediate Anesthesia Transfer of Care Note  Patient: GEVORG BRUM  Procedure(s) Performed: ATRIAL FIBRILLATION ABLATION (N/A )  Patient Location: Cath Lab  Anesthesia Type:General  Level of Consciousness: awake, alert , drowsy and patient cooperative  Airway & Oxygen Therapy: Patient Spontanous Breathing and Patient connected to face mask oxygen  Post-op Assessment: Report given to RN, Post -op Vital signs reviewed and stable and Patient moving all extremities X 4  Post vital signs: Reviewed and stable  Last Vitals:  Vitals Value Taken Time  BP    Temp    Pulse 71 03/30/19 0952  Resp 13 03/30/19 0952  SpO2 100 % 03/30/19 0952  Vitals shown include unvalidated device data.  Last Pain:  Vitals:   03/30/19 0604  TempSrc:   PainSc: 0-No pain      Patients Stated Pain Goal: 7 (32/54/98 2641)  Complications: No apparent anesthesia complications

## 2019-03-31 ENCOUNTER — Encounter (HOSPITAL_COMMUNITY): Payer: Self-pay | Admitting: Internal Medicine

## 2019-03-31 NOTE — Anesthesia Postprocedure Evaluation (Signed)
Anesthesia Post Note  Patient: Lance [name]  Procedure(s) Performed: ATRIAL FIBRILLATION ABLATION (N/A )     Patient location during evaluation: PACU Anesthesia Type: General Level of consciousness: awake and alert Pain management: pain level controlled Vital Signs Assessment: post-procedure vital signs reviewed and stable Respiratory status: spontaneous breathing, nonlabored ventilation, respiratory function stable and patient connected to nasal cannula oxygen Cardiovascular status: blood pressure returned to baseline and stable Postop Assessment: no apparent nausea or vomiting Anesthetic complications: no    Last Vitals:  Vitals:   03/30/19 1445 03/30/19 1500  BP:  121/65  Pulse: 73 74  Resp: 16 (!) 22  Temp:    SpO2: 96% 97%    Last Pain:  Vitals:   03/30/19 1252  TempSrc:   PainSc: 3                  Kailoni Vahle

## 2019-04-01 ENCOUNTER — Other Ambulatory Visit (HOSPITAL_COMMUNITY): Payer: Self-pay | Admitting: Cardiology

## 2019-04-06 ENCOUNTER — Encounter: Payer: Self-pay | Admitting: Internal Medicine

## 2019-04-20 ENCOUNTER — Encounter (HOSPITAL_COMMUNITY): Payer: Self-pay | Admitting: Physician Assistant

## 2019-04-20 ENCOUNTER — Ambulatory Visit
Admission: RE | Admit: 2019-04-20 | Discharge: 2019-04-20 | Disposition: A | Discharge status: Home / Self Care | Payer: BC Managed Care – PPO | Source: Ambulatory Visit | Attending: Internal Medicine | Admitting: Internal Medicine

## 2019-04-20 ENCOUNTER — Other Ambulatory Visit: Payer: Self-pay

## 2019-04-20 ENCOUNTER — Ambulatory Visit (HOSPITAL_COMMUNITY)
Admission: RE | Admit: 2019-04-20 | Discharge: 2019-04-20 | Disposition: A | Discharge status: Home / Self Care | Payer: BC Managed Care – PPO | Source: Ambulatory Visit | Attending: Physician Assistant | Admitting: Physician Assistant

## 2019-04-20 VITALS — BP 118/50 | HR 80 | Ht 74.0 in | Wt 306.2 lb

## 2019-04-20 DIAGNOSIS — Z8249 Family history of ischemic heart disease and other diseases of the circulatory system: Secondary | ICD-10-CM | POA: Diagnosis not present

## 2019-04-20 DIAGNOSIS — Z87891 Personal history of nicotine dependence: Secondary | ICD-10-CM | POA: Insufficient documentation

## 2019-04-20 DIAGNOSIS — I5022 Chronic systolic (congestive) heart failure: Secondary | ICD-10-CM | POA: Insufficient documentation

## 2019-04-20 DIAGNOSIS — E669 Obesity, unspecified: Secondary | ICD-10-CM | POA: Insufficient documentation

## 2019-04-20 DIAGNOSIS — Z79899 Other long term (current) drug therapy: Secondary | ICD-10-CM | POA: Diagnosis not present

## 2019-04-20 DIAGNOSIS — I4819 Other persistent atrial fibrillation: Secondary | ICD-10-CM | POA: Insufficient documentation

## 2019-04-20 DIAGNOSIS — I4892 Unspecified atrial flutter: Secondary | ICD-10-CM | POA: Diagnosis not present

## 2019-04-20 DIAGNOSIS — I110 Hypertensive heart disease with heart failure: Secondary | ICD-10-CM | POA: Insufficient documentation

## 2019-04-20 DIAGNOSIS — D6869 Other thrombophilia: Secondary | ICD-10-CM | POA: Diagnosis not present

## 2019-04-20 DIAGNOSIS — G4733 Obstructive sleep apnea (adult) (pediatric): Secondary | ICD-10-CM | POA: Insufficient documentation

## 2019-04-20 DIAGNOSIS — N451 Epididymitis: Secondary | ICD-10-CM | POA: Diagnosis not present

## 2019-04-20 DIAGNOSIS — Z7901 Long term (current) use of anticoagulants: Secondary | ICD-10-CM | POA: Insufficient documentation

## 2019-04-20 DIAGNOSIS — Z6839 Body mass index (BMI) 39.0-39.9, adult: Secondary | ICD-10-CM | POA: Insufficient documentation

## 2019-04-20 NOTE — Progress Notes (Signed)
Primary Care Physician: Tresa GarterPlotnikov, Aleksei V, MD Primary Cardiologist: Dr Gala RomneyBensimhon Primary Electrophysiologist: Dr Johney FrameAllred Referring Physician: Dr Tito DineAllred   Kato Burnell BlanksM Deahl is a 38 y.o. male with a history of persistent atrial fibrillation, atrial flutter, OSA, tachycardia mediated CM, PVD who presents for [facility]. Patient was admitted in 2014 with afib RVR and was loaded on amiodarone and became profoundly hypotensive requiring levophed and IVF.  He underwent TEE and failed DCCV on 3/9.  Dr. Ladona Ridgelaylor placed him on Tikosyn and he had polymorphic VT.  Transitioned to oral amiodarone with good rate response.  Echo during admission showed EF 10% with diffuse hypokinesis.  He was diuresed and discharged on amiodarone, xarelto, digoxin, ramipril, lopressor and lasix.  S/P A-Flutter ablation 2014. EF subsequently returned to normal. Readmitted in July 2017 with HF and found to have recurrent AF with RVR. EF back down to 25-30%. [facility] Maintained NSR for 2 mintues and then back to AF. He was placed on IV amiodarone with close monitoring of BP along with oral amiodarone for faster loading. He eventually converted back to NSR on IV amiodarone. He continued to have episodes of symptomatic afib. He underwent repeat afib ablation on 03/30/19. He is on Xarelto for a CHADS2VASC score of 2.  On follow up today, patient reports he has done well since his ablation. He has had a few brief episodes where his heart rate >100 but overall his heart rates have been well controlled. He did have some chest discomfort immediately following the procedure but this resolved within a few days. He denies swallowing or groin issues.   Today, he denies symptoms of chest pain, shortness of breath, orthopnea, PND, lower extremity edema, dizziness, presyncope, syncope, snoring, daytime somnolence, bleeding, or neurologic sequela. The patient is tolerating  medications without difficulties and is otherwise without complaint today.    Atrial Fibrillation Risk Factors:  he does have symptoms or diagnosis of sleep apnea. he is compliant with CPAP therapy. he does not have a history of rheumatic fever. he does not have a history of alcohol use. The patient does not have a history of early familial atrial fibrillation or other arrhythmias.  he has a BMI of Body mass index is 39.31 kg/m.Marland Kitchen. Filed Weights   04/20/19 1522  Weight: (!) 138.9 kg    Family History  Problem Relation Age of Onset   Diabetes Mother    Hypertension Mother    Varicose Veins Mother    Depression Father    Osteoarthritis Father    Alcohol abuse Father      Atrial Fibrillation Management history:  Previous antiarrhythmic drugs: flecainide, dofetilide, amiodarone Previous cardioversions: 2014, 2017, 2018 Previous ablations: flutter with Dr Ladona Ridgelaylor 2014, afib ablations 2015, 03/30/19 CHADS2VASC score: 2 Anticoagulation history: Xarelto   Past Medical History:  Diagnosis Date   Acute systolic heart failure (HCC) 09/14/2012   Atrial flutter (HCC)    s/p ablation by Dr Ladona Ridgelaylor   Medical history non-contributory    Neuromuscular disorder (HCC)    Onychomycosis    OSA on CPAP    Peripheral vascular disease (HCC)    Persistent atrial fibrillation (HCC) 2003   Past Surgical History:  Procedure Laterality Date   ATRIAL FIBRILLATION ABLATION N/A 02/15/2014   Procedure: ATRIAL FIBRILLATION ABLATION;  Surgeon: Gardiner RhymeJames D Allred, MD;  [facility]: [facility] CATH LAB;  Service: Cardiovascular;  Laterality: N/A;   ATRIAL FIBRILLATION ABLATION N/A 03/30/2019  Procedure: ATRIAL FIBRILLATION ABLATION;  Surgeon: Hillis Range, MD;  Location: [facility] INVASIVE CV LAB;  Service: Cardiovascular;  Laterality: N/A;   ATRIAL FLUTTER ABLATION  09/14/2012   by Dr [name]   ATRIAL FLUTTER ABLATION N/A 09/14/2012   Procedure: ATRIAL FLUTTER ABLATION;  Surgeon: Marinus Maw, MD;   Location: [facility] CATH LAB;  Service: Cardiovascular;  Laterality: N/A;   CARDIAC CATHETERIZATION     CARDIOVERSION N/A 08/14/2012   Procedure: CARDIOVERSION;  Surgeon: Dolores Patty, MD;  Location: [facility] OR;  Service: Cardiovascular;  Laterality: N/A;   CARDIOVERSION N/A 01/03/2016   Procedure: CARDIOVERSION;  Surgeon: Pricilla Riffle, MD;  Location: [facility] ENDOSCOPY;  Service: Cardiovascular;  Laterality: N/A;   CARDIOVERSION N/A 03/08/2017   Procedure: CARDIOVERSION;  Surgeon: Dolores Patty, MD;  Location: [facility] ENDOSCOPY;  Service: Cardiovascular;  Laterality: N/A;   MANDIBLE SURGERY     underbite correction   TEE WITHOUT CARDIOVERSION N/A 02/14/2014   Procedure: TRANSESOPHAGEAL ECHOCARDIOGRAM (TEE);  Surgeon: Quintella Reichert, MD;  Location: [name] ENDOSCOPY;  Service: Cardiovascular;  Laterality: N/A;   TEE WITHOUT CARDIOVERSION N/A 01/03/2016   Procedure: TRANSESOPHAGEAL ECHOCARDIOGRAM (TEE);  Surgeon: Pricilla Riffle, MD;  Location: [facility] ENDOSCOPY;  Service: Cardiovascular;  Laterality: N/A;    Current Outpatient Medications  Medication Sig Dispense Refill   acetaminophen (TYLENOL) 500 MG tablet Take 1,000 mg by mouth every 6 (six) hours as needed for headache.     fexofenadine (ALLEGRA) 30 MG tablet Take 30 mg by mouth at bedtime as needed.      furosemide (LASIX) 40 MG tablet Take 1 tablet by mouth twice daily (Patient taking differently: Take 40 mg by mouth daily. ) 180 tablet 0   losartan (COZAAR) 25 MG tablet TAKE 1 TABLET BY MOUTH ONCE DAILY (Patient taking differently: Take 25 mg by mouth daily. ) 90 tablet 3   metoprolol succinate (TOPROL-XL) 25 MG 24 hr tablet Take 25 mg by mouth daily.     potassium chloride SA (KLOR-CON) 20 MEQ tablet TAKE 1  BY MOUTH TWICE DAILY WITH  FUROSEMIDE (Patient taking differently: Take 20 mEq by mouth daily. ) 60 tablet 0   triamcinolone cream (KENALOG) 0.1 % Apply 1 application topically 2 (two) times daily. (Patient taking differently: Apply 1 application  topically as needed (Face). ) 45 g 1   XARELTO 20 MG TABS tablet TAKE 1 TABLET BY MOUTH ONCE DAILY WITH SUPPER 30 tablet 0   No current facility-administered medications for this encounter.     Allergies  Allergen Reactions   Amiodarone Other (See Comments)    IV intolerance with Severe hypotension, shock after amiodarone bolus.  Taking po Amiodarone without problems. Pt can tolerate IV drip but not bolus.    Altace [Ramipril]     Cough    Azithromycin Itching   Protonix [Pantoprazole Sodium]     Couldn't belch   Fish Allergy Other (See Comments)    Not shell fish - regular fish causes tingling   Metoprolol Succinate Other (See Comments)    Body aches at high doses.  Currently takes two tablets of lopressor 25mg  twice daily; three tablets twice daily caused him to ache.    Social History   Socioeconomic History   Marital status: Single    Spouse name: Not on file   Number of children: Not on file   Years of education: Not on file   Highest education level: Not on file  Occupational History   Occupation: [employer]  Employer: [employer]  Social Needs   Financial resource strain: Not on file   Food insecurity    Worry: Not on file    Inability: Not on file   Transportation needs    Medical: Not on file    Non-medical: Not on file  Tobacco Use   Smoking status: Former Smoker    Years: 3.00    Types: Cigarettes, Cigars    Quit date: 05/12/2009    Years since quitting: 9.9   Smokeless tobacco: Never Used   Tobacco comment: 4 cigars daily  Substance and Sexual Activity   Alcohol use: Not Currently    Comment: Glass of wine on weekends   Drug use: Not Currently    Types: Marijuana    Comment: occassional use    Sexual activity: Yes  Lifestyle   Physical activity    Days per week: Not on file    Minutes per session: Not on file   Stress: Not on file  Relationships   Social connections    Talks on phone: Not on file    Gets together:  Not on file    Attends religious service: Not on file    Active member of club or organization: Not on file    Attends meetings of clubs or organizations: Not on file    Relationship status: Not on file   Intimate partner violence    Fear of current or ex partner: Not on file    Emotionally abused: Not on file    Physically abused: Not on file    Forced sexual activity: Not on file  Other Topics Concern   Not on file  Social History Narrative   Not on file     ROS- All systems are reviewed and negative except as per the HPI above.  Physical Exam: Vitals:   04/20/19 1522  BP: (!) 118/50  Pulse: 80  Weight: (!) 138.9 kg  Height: 6\' 2"  (1.88 m)    GEN- The patient is well appearing obese male, alert and oriented x 3 today.   Head- normocephalic, atraumatic Eyes-  Sclera clear, conjunctiva pink Ears- hearing intact Oropharynx- clear Neck- supple  Lungs- Clear to ausculation bilaterally, normal work of breathing Heart- Regular rate and rhythm, no murmurs, rubs or gallops  GI- soft, NT, ND, + BS Extremities- no clubbing, cyanosis, or edema MS- no significant deformity or atrophy Skin- no rash or lesion Psych- euthymic mood, full affect Neuro- strength and sensation are intact  Wt Readings from Last 3 Encounters:  04/20/19 (!) 138.9 kg  03/30/19 136.1 kg  03/16/19 (!) 140.2 kg    EKG today demonstrates SR HR 80, PACs, LAFB, PR 180, QRS 122, QTc 514  Echo 02/02/19 demonstrated  1. The left ventricle has normal systolic function, with an ejection fraction of 55-60%. The cavity size was normal. Left ventricular diastolic parameters were normal.  2. The right ventricle has normal systolic function. The cavity was normal. There is no increase in right ventricular wall thickness. Right ventricular systolic pressure could not be assessed.  3. The aorta is abnormal unless otherwise noted.  4. There is mild dilatation of the aortic root measuring 39 mm.  5. The interatrial  septum appears to be lipomatous.  Epic records are reviewed at length today  Assessment and Plan:  1. Persistent atrial fibrillation/atrial flutter S/p flutter ablation 2014 and afib ablation 2015. S/p repeat afib ablation 03/30/19 with Dr Johney Frame. Patient appears to be maintaining SR. Continue  Xarelto 20 mg daily with no missed doses for at least 3 months post ablation. Lifestyle changes as below.  This patients CHA2DS2-VASc Score and unadjusted Ischemic Stroke Rate (% per year) is equal to 2.2 % stroke rate/year from a score of 2  Above score calculated as 1 point each if present [CHF, HTN, DM, Vascular=MI/PAD/Aortic Plaque, Age if 65-74, or Male] Above score calculated as 2 points each if present [Age > 75, or Stroke/TIA/TE]   2. Obesity Body mass index is 39.31 kg/m. Lifestyle modification was discussed at length including regular exercise and weight reduction. Patient states he walks 3 miles at least 3 days per week.  3. Obstructive sleep apnea The importance of adequate treatment of sleep apnea was discussed today in order to improve our ability to maintain sinus rhythm long term. Encouraged compliance with BiPap therapy.  4. Chronic systolic CHF Likely TCM EF normalized on most recent echo. No signs or symptoms of fluid overload today.  5. HTN Stable, no changes today.   Follow up with Dr Rayann Heman per recall.   [facility] [address] [place], [place] [address] [phone] 04/20/2019 3:32 PM

## 2019-04-21 NOTE — Progress Notes (Signed)
thanks

## 2019-05-01 ENCOUNTER — Other Ambulatory Visit (HOSPITAL_COMMUNITY): Payer: Self-pay | Admitting: Cardiology

## 2019-05-23 ENCOUNTER — Other Ambulatory Visit (HOSPITAL_COMMUNITY): Payer: Self-pay | Admitting: Cardiology

## 2019-06-26 ENCOUNTER — Other Ambulatory Visit (HOSPITAL_COMMUNITY): Payer: Self-pay | Admitting: Cardiology

## 2019-07-21 ENCOUNTER — Telehealth (INDEPENDENT_AMBULATORY_CARE_PROVIDER_SITE_OTHER): Payer: BC Managed Care – PPO | Admitting: Internal Medicine

## 2019-07-21 ENCOUNTER — Encounter: Payer: Self-pay | Admitting: Internal Medicine

## 2019-07-21 VITALS — Ht 74.0 in | Wt 300.0 lb

## 2019-07-21 DIAGNOSIS — D6869 Other thrombophilia: Secondary | ICD-10-CM

## 2019-07-21 DIAGNOSIS — G4733 Obstructive sleep apnea (adult) (pediatric): Secondary | ICD-10-CM

## 2019-07-21 DIAGNOSIS — I4819 Other persistent atrial fibrillation: Secondary | ICD-10-CM | POA: Diagnosis not present

## 2019-07-21 DIAGNOSIS — I10 Essential (primary) hypertension: Secondary | ICD-10-CM

## 2019-07-21 DIAGNOSIS — I5022 Chronic systolic (congestive) heart failure: Secondary | ICD-10-CM

## 2019-07-21 MED ORDER — RIVAROXABAN 20 MG PO TABS: ORAL_TABLET | ORAL | 6 refills | Status: DC

## 2019-07-21 MED ORDER — METOPROLOL SUCCINATE ER 25 MG PO TB24: 25.0000 mg | ORAL_TABLET | Freq: Every day | ORAL | 2 refills | Status: DC

## 2019-07-21 NOTE — Addendum Note (Signed)
Addended by: Baird Lyons on: 07/21/2019 01:11 PM   Modules accepted: Orders

## 2019-07-21 NOTE — Progress Notes (Signed)
Electrophysiology TeleHealth Note   Due to national recommendations of social distancing due to COVID 19, an audio/video telehealth visit is felt to be most appropriate for this patient at this time.  See MyChart message from today for the patient's consent to telehealth for [facility].   Date:  07/21/2019   ID:  Lance [name], DOB [birth year], MRN [id number]  Location: patient's home  Provider location:  [facility]  Evaluation Performed: Follow-up visit  PCP:  Plotnikov, Georgina Quint, MD   Electrophysiologist:  Dr Johney Frame  Chief Complaint:  AF follow up  History of Present Illness:    Lance [name] is a 39 y.o. male who presents via telehealth conferencing today.  Since last being seen in our clinic, the patient reports doing very well.  He denies procedure related complications. Today, he denies symptoms of palpitations, chest pain, shortness of breath,  lower extremity edema, dizziness, presyncope, or syncope.  The patient is otherwise without complaint today.  The patient denies symptoms of fevers, chills, cough, or new SOB worrisome for COVID 19.  Past Medical History:  Diagnosis Date  . Acute systolic heart failure (HCC) 09/14/2012  . Atrial flutter [facility])    s/p ablation by Dr [name]  . Medical history non-contributory   . Neuromuscular disorder (HCC)   . Onychomycosis   . OSA on CPAP   . Peripheral vascular disease (HCC)   . Persistent atrial fibrillation (HCC) 2003    Past Surgical History:  Procedure Laterality Date  . ATRIAL FIBRILLATION ABLATION N/A 02/15/2014   Procedure: ATRIAL FIBRILLATION ABLATION;  Surgeon: Gardiner Rhyme, MD;  Location: [facility] CATH LAB;  Service: Cardiovascular;  Laterality: N/A;  . ATRIAL FIBRILLATION ABLATION N/A 03/30/2019   Procedure: ATRIAL FIBRILLATION ABLATION;  Surgeon: Hillis Range, MD;  Location: [facility] INVASIVE CV LAB;  Service: Cardiovascular;  Laterality: N/A;  . ATRIAL FLUTTER ABLATION  09/14/2012   by Dr [name]  . ATRIAL  FLUTTER ABLATION N/A 09/14/2012   Procedure: ATRIAL FLUTTER ABLATION;  Surgeon: Marinus Maw, MD;  Location: [facility] CATH LAB;  Service: Cardiovascular;  Laterality: N/A;  . CARDIAC CATHETERIZATION    . CARDIOVERSION N/A 08/14/2012   Procedure: CARDIOVERSION;  Surgeon: Dolores Patty, MD;  Location: [facility] OR;  Service: Cardiovascular;  Laterality: N/A;  . CARDIOVERSION N/A 01/03/2016   Procedure: CARDIOVERSION;  Surgeon: Pricilla Riffle, MD;  Location: [facility] ENDOSCOPY;  Service: Cardiovascular;  Laterality: N/A;  . CARDIOVERSION N/A 03/08/2017   Procedure: CARDIOVERSION;  Surgeon: Dolores Patty, MD;  Location: [facility] ENDOSCOPY;  Service: Cardiovascular;  Laterality: N/A;  . MANDIBLE SURGERY     underbite correction  . TEE WITHOUT CARDIOVERSION N/A 02/14/2014   Procedure: TRANSESOPHAGEAL ECHOCARDIOGRAM (TEE);  Surgeon: Quintella Reichert, MD;  Location: [facility] ENDOSCOPY;  Service: Cardiovascular;  Laterality: N/A;  . TEE WITHOUT CARDIOVERSION N/A 01/03/2016   Procedure: TRANSESOPHAGEAL ECHOCARDIOGRAM (TEE);  Surgeon: Pricilla Riffle, MD;  Location: [facility] ENDOSCOPY;  Service: Cardiovascular;  Laterality: N/A;    Current Outpatient Medications  Medication Sig Dispense Refill  . acetaminophen (TYLENOL) 500 MG tablet Take 1,000 mg by mouth every 6 (six) hours as needed for headache.    . fexofenadine (ALLEGRA) 30 MG tablet Take 30 mg by mouth at bedtime as needed.     . furosemide (LASIX) 40 MG tablet Take 1 tablet by mouth twice daily 180 tablet 0  . losartan (COZAAR) 25 MG tablet Take 1 tablet by mouth once daily 90 tablet 0  .  metoprolol succinate (TOPROL-XL) 25 MG 24 hr tablet Take 25 mg by mouth daily.    . potassium chloride SA (KLOR-CON) 20 MEQ tablet TAKE 1  BY MOUTH TWICE DAILY WITH  FUROSEMIDE 60 tablet 0  . triamcinolone cream (KENALOG) 0.1 % Apply 1 application topically 2 (two) times daily. 45 g 1  . XARELTO 20 MG TABS tablet TAKE 1 TABLET BY MOUTH ONCE DAILY WITH SUPPER 30 tablet 0   No current  facility-administered medications for this visit.    Allergies:   Amiodarone, Altace [ramipril], Azithromycin, Protonix [pantoprazole sodium], Fish allergy, and Metoprolol succinate   Social History:  The patient  reports that he quit smoking about 10 years ago. His smoking use included cigarettes and cigars. He quit after 3.00 years of use. He has never used smokeless tobacco. He reports previous alcohol use. He reports previous drug use. Drug: Marijuana.   Family History:  The patient's  family history includes Alcohol abuse in his father; Depression in his father; Diabetes in his mother; Hypertension in his mother; Osteoarthritis in his father; Varicose Veins in his mother.   ROS:  Please see the history of present illness.   All other systems are personally reviewed and negative.    Exam:    Vital Signs:  Ht 6\' 2"  (1.88 m)   Wt 300 lb (136.1 kg)   BMI 38.52 kg/m   Well sounding and appearing, alert and conversant, regular work of breathing,  good skin color Eyes- anicteric, neuro- grossly intact, skin- no apparent rash or lesions or cyanosis, mouth- oral mucosa is pink  Labs/Other Tests and Data Reviewed:    Recent Labs: 01/12/2019: TSH 0.997 03/16/2019: ALT 33 03/27/2019: BUN 18; Creatinine, Ser 1.23; Hemoglobin 15.4; Platelets 267; Potassium 4.4; Sodium 140   Wt Readings from Last 3 Encounters:  07/21/19 300 lb (136.1 kg)  04/20/19 (!) 306 lb 3.2 oz (138.9 kg)  03/30/19 300 lb (136.1 kg)       ASSESSMENT & PLAN:    1.  Persistent atrial fibrillation/ atrial flutter Did well initially post ablation - feels that heart rate may be increased in last couple of weeks Continue Xarelto for CHADS2VASC of 2 We discussed monitoring options today including implantable loop recorder vs Kardia.  He is clear that he would like to avoid ILR implant at this time.  I have encouraged Kardia today.   2.  Obesity Body mass index is 38.52 kg/m. Weight loss encouraged   3.   OSA Compliant with Bipap  4.  Chronic systolic heart failure Felt to be tachycardia mediated EF normalized by recent echo   5.  HTN Stable No change required today    Follow-up:  With me in 3 months    Patient Risk:  after full review of this patients clinical status, I feel that they are at moderate risk at this time.  Today, I have spent 15 minutes with the patient with telehealth technology discussing arrhythmia management .    Army Fossa, MD  07/21/2019 12:38 PM     [place] [address] [phone] (office) [phone] (fax)

## 2019-07-21 NOTE — Addendum Note (Signed)
Addended by: Baird Lyons on: 07/21/2019 01:03 PM   Modules accepted: Orders

## 2019-07-27 ENCOUNTER — Other Ambulatory Visit (HOSPITAL_COMMUNITY): Payer: Self-pay | Admitting: Cardiology

## 2019-08-25 ENCOUNTER — Other Ambulatory Visit (HOSPITAL_COMMUNITY): Payer: Self-pay | Admitting: Cardiology

## 2019-10-05 ENCOUNTER — Other Ambulatory Visit (HOSPITAL_COMMUNITY): Payer: Self-pay | Admitting: Cardiology

## 2019-10-22 IMAGING — CT CT HEART MORPH/PULM VEIN W/ CM & W/O CA SCORE
3 of 6 series · 10 of 20 positions shown, 11 images · IV contrast (APPLIED)
Comparison: None.
COMPARISON: None.

Addendum:
EXAM:
OVER-READ INTERPRETATION  CT CHEST

The following report is an over-read performed by radiologist Dr.
Ruven Ong [REDACTED] on 03/27/2019. This
over-read does not include interpretation of cardiac or coronary
anatomy or pathology. The coronary CTA interpretation by the
cardiologist is attached.
CLINICAL DATA: 37-year-old male with atrial fibrillation scheduled
for an ablation.
Cardiac CT/CTA
TECHNIQUE: The patient was scanned on a Siemens Somatom scanner.

[Series 7: +(id) delay · axial · delayed · 0.46mm/px · z∈[-296,-257]mm · 2 of 231 slices shown]
[im 77/231  vessel]
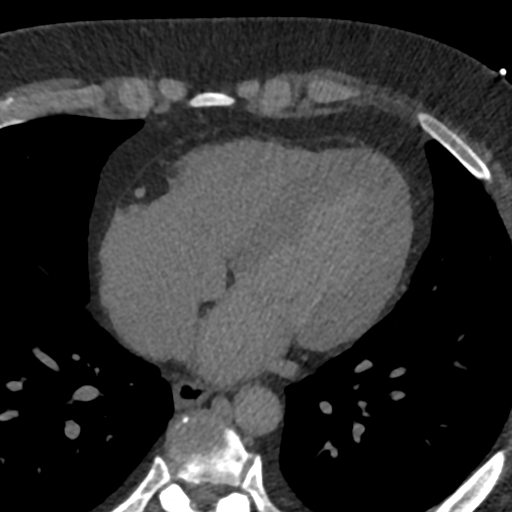
[im 154/231  vessel]
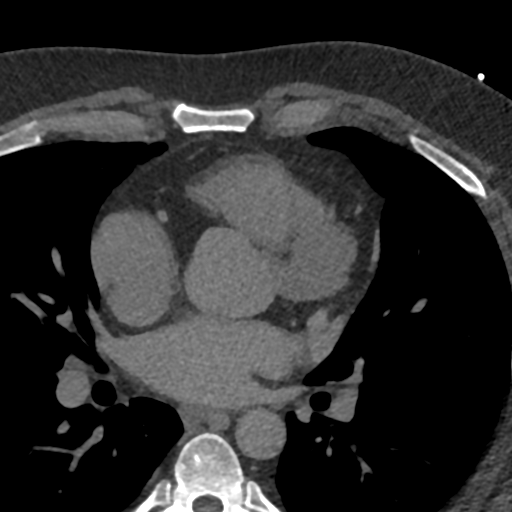

[Series 8: best diast · axial · 0.46mm/px · z∈[-325,-244]mm · 4 of 336 slices shown, 5 images]
[im 68/336  vessel]
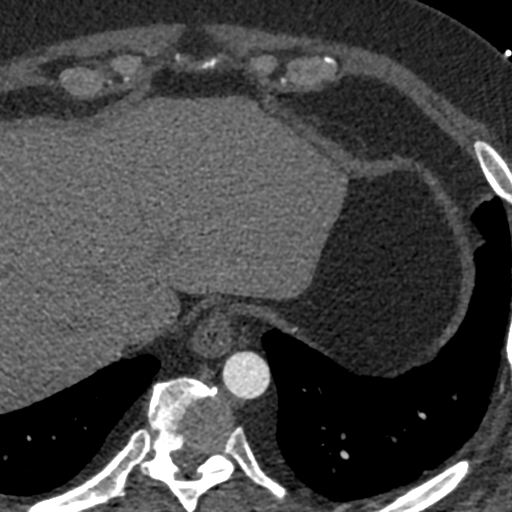
[im 68/336  lung]
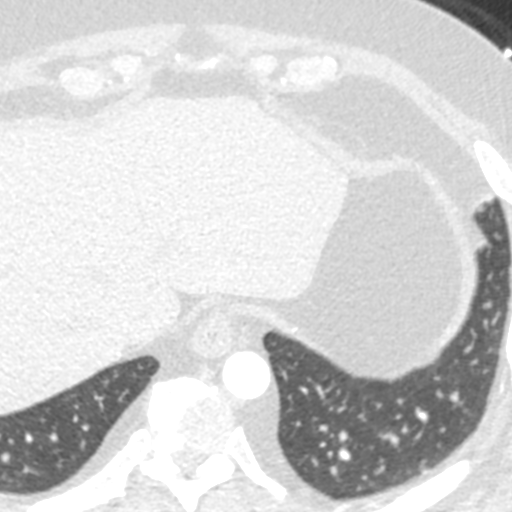
[im 135/336  vessel]
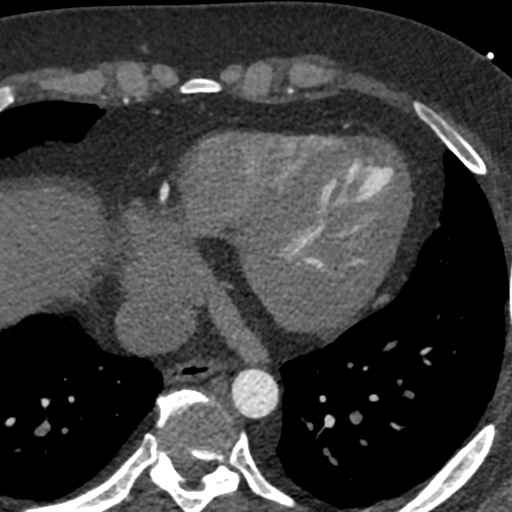
[im 202/336  vessel]
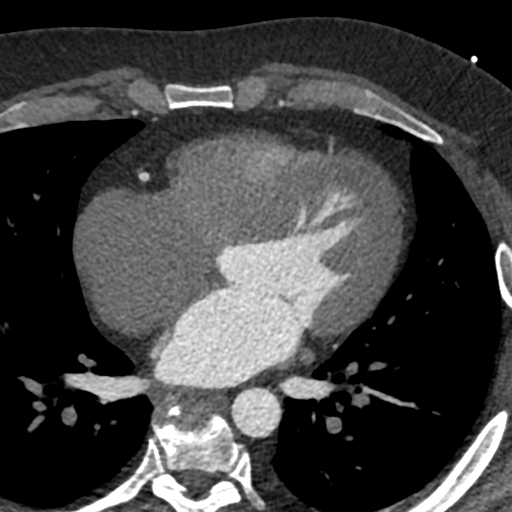
[im 269/336  vessel]
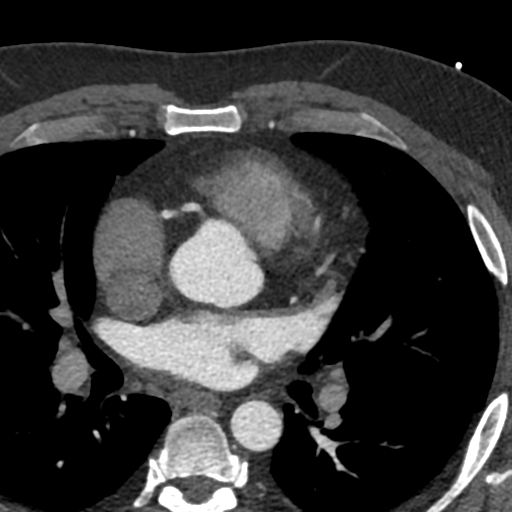

[Series 9: +300 ms · axial · 0.46mm/px · z∈[-325,-244]mm · 4 of 336 slices shown]
[im 68/336  vessel]
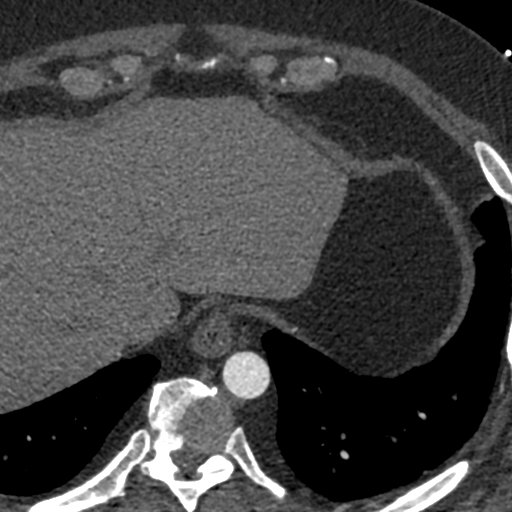
[im 135/336  vessel]
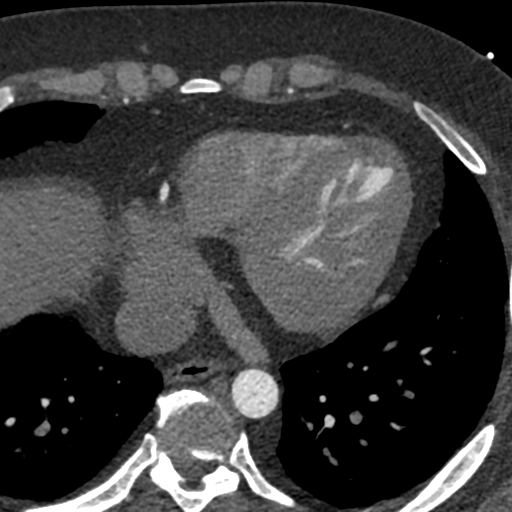
[im 202/336  vessel]
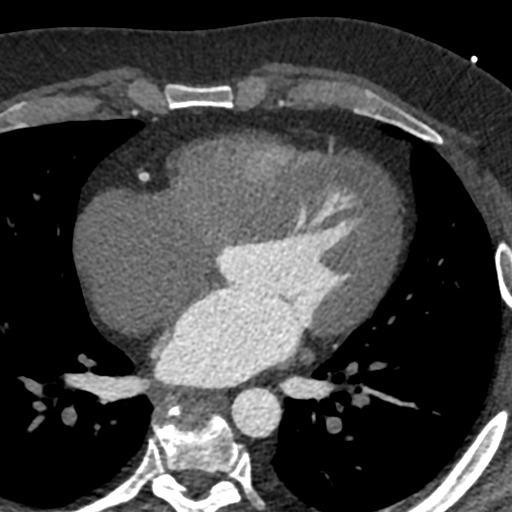
[im 269/336  vessel]
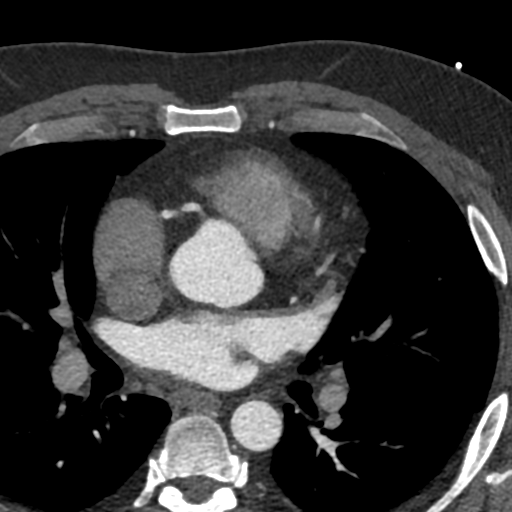

[10 of 20 positions shown; findings below may reference images not displayed]

FINDINGS: Vascular: Heart is upper limits normal in size. Aorta is normal
caliber.

Mediastinum/Nodes: No adenopathy in the lower mediastinum or hila.

Lungs/Pleura: 3 mm subpleural anterior right middle lobe nodule.
Small subpleural nodules in the left lower lobe, 4 mm and 5 mm. No
effusions.

Upper Abdomen: Imaging into the upper abdomen shows no acute
findings.

Musculoskeletal: Chest wall soft tissues are unremarkable. No acute
bony abnormality.
IMPRESSION: Scattered small subpleural nodules, 5 mm or less. No follow-up
needed if patient is low-risk (and has no known or suspected primary
neoplasm). Non-contrast chest CT can be considered in 12 months if
patient is high-risk. This recommendation follows the consensus
statement: Guidelines for Management of Incidental Pulmonary Nodules
Detected on CT Images: From the [HOSPITAL] 6709; Radiology
FINDINGS: A 120 kV prospective scan was triggered in the descending thoracic
aorta at 111 HU's. Gantry rotation speed was 280 msecs and
collimation was .9 mm. No beta blockade and no NTG was given. The 3D
data set was reconstructed in 5% intervals of the 60-80 % of the R-R
cycle. Diastolic phases were analyzed on a dedicated work station
using MPR, MIP and VRT modes. The patient received 80 cc of
contrast.

There is normal pulmonary vein drainage into the left atrium (2 on
the right and 2 on the left) with ostial measurements as follows:

RUPV: 19.1 x 18.0 mm

RLPV: 16.3 x 14.1 mm

LUPV: 14.4 x 10.5 mm

LLPV: 16.2 x 9.9 mm

The left atrial appendage is medium size with no evidence for a
thrombus.

The esophagus runs in the left atrial midline and is not in the
proximity to any of the pulmonary veins.

Aorta:  Normal caliber.  No dissection or calcifications.

Aortic Valve:  Trileaflet.  No calcifications.

Coronary Arteries: Normal coronary origin. Right dominance. Calcium
score is 0. There is no evidence for CAD.
IMPRESSION: 1. There is normal pulmonary vein drainage into the left atrium.

2. The left atrial appendage is medium size with no evidence for a
thrombus.

3. The esophagus runs in the left atrial midline and is not in the
proximity to any of the pulmonary veins.

4. Coronary Arteries: Normal coronary origin. Right dominance.
Calcium score is 0. There is no evidence for CAD.

*** End of Addendum ***
EXAM:
OVER-READ INTERPRETATION  CT CHEST

The following report is an over-read performed by radiologist Dr.
Ruven Ong [REDACTED] on 03/27/2019. This
over-read does not include interpretation of cardiac or coronary
anatomy or pathology. The coronary CTA interpretation by the
cardiologist is attached.
FINDINGS: Vascular: Heart is upper limits normal in size. Aorta is normal
caliber.

Mediastinum/Nodes: No adenopathy in the lower mediastinum or hila.

Lungs/Pleura: 3 mm subpleural anterior right middle lobe nodule.
Small subpleural nodules in the left lower lobe, 4 mm and 5 mm. No
effusions.

Upper Abdomen: Imaging into the upper abdomen shows no acute
findings.

Musculoskeletal: Chest wall soft tissues are unremarkable. No acute
bony abnormality.
IMPRESSION: Scattered small subpleural nodules, 5 mm or less. No follow-up
needed if patient is low-risk (and has no known or suspected primary
neoplasm). Non-contrast chest CT can be considered in 12 months if
patient is high-risk. This recommendation follows the consensus
statement: Guidelines for Management of Incidental Pulmonary Nodules
Detected on CT Images: From the [HOSPITAL] 6709; Radiology

## 2019-11-15 IMAGING — US US SCROTUM W/ DOPPLER COMPLETE
1 series · 13 of 25 positions shown · non-contrast
Comparison: None

CLINICAL DATA: Epididymitis, LEFT epididymal pain for 2 months

EXAM:
SCROTAL ULTRASOUND
DOPPLER ULTRASOUND OF THE TESTICLES
TECHNIQUE: Complete ultrasound examination of the testicles, epididymis, and
other scrotal structures was performed. Color and spectral Doppler
ultrasound were also utilized to evaluate blood flow to the
testicles.

[Series 1: us scrotum w/ doppler complete · 0.07mm/px · 13 of 47 slices shown]
[im 1/47]
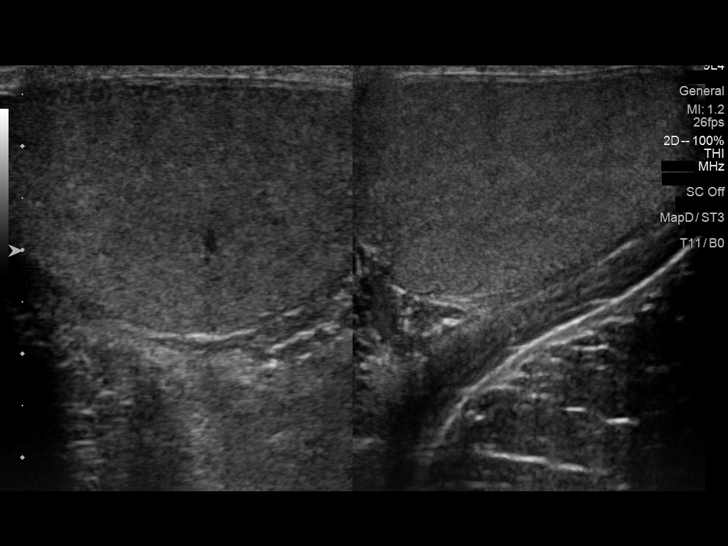
[im 4/47]
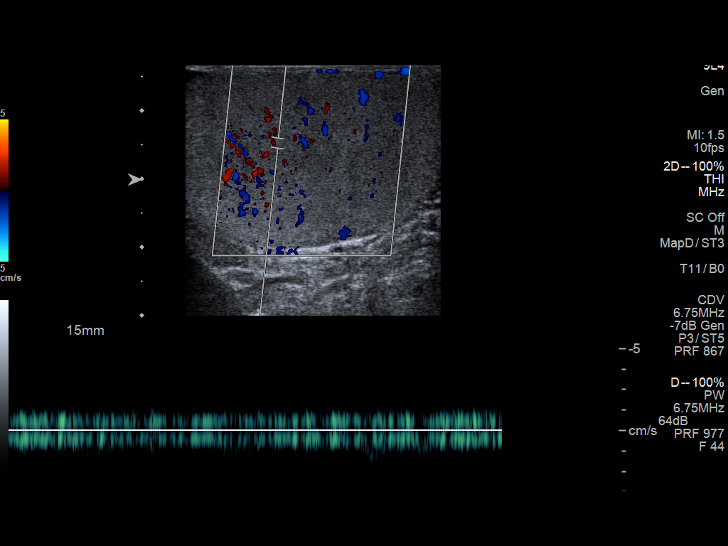
[im 8/47]
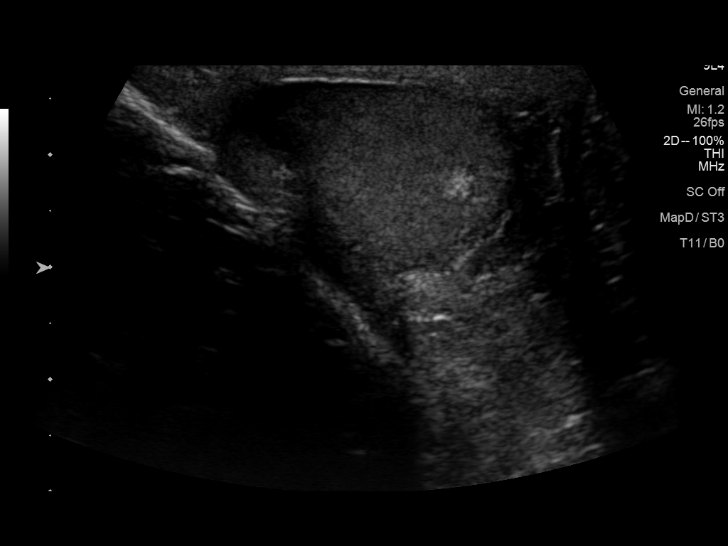
[im 12/47]
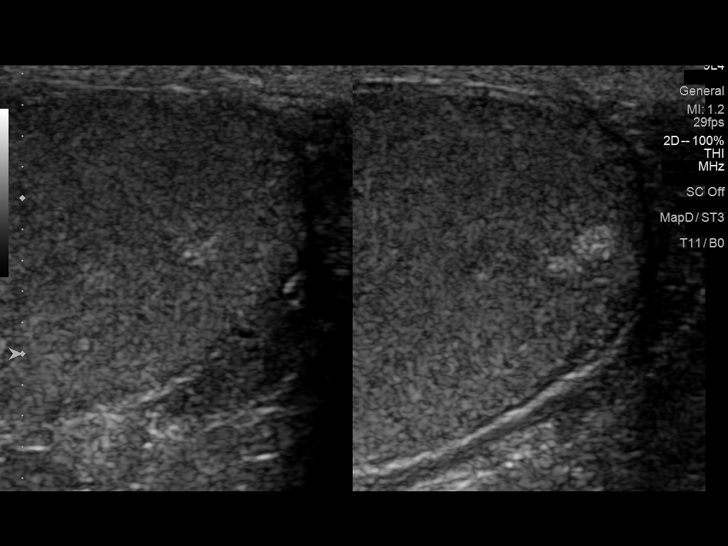
[im 16/47]
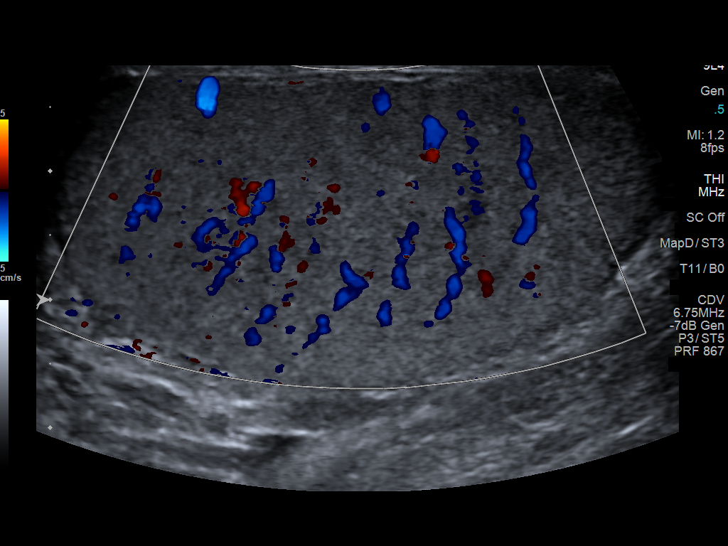
[im 20/47]
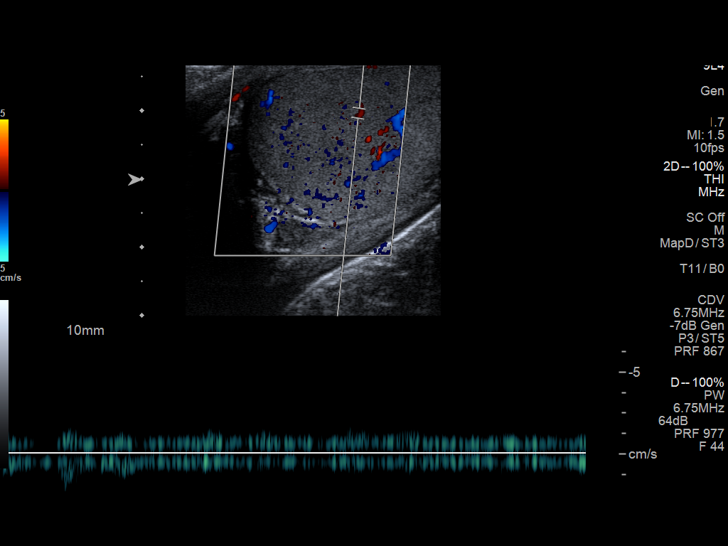
[im 24/47]
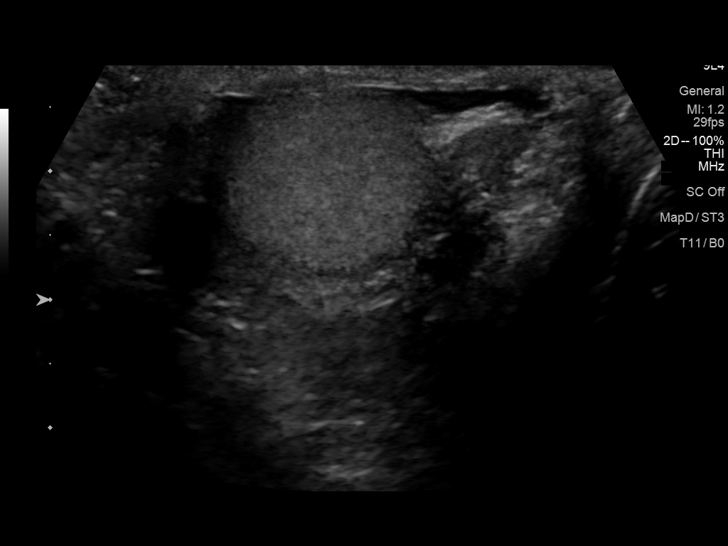
[im 27/47]
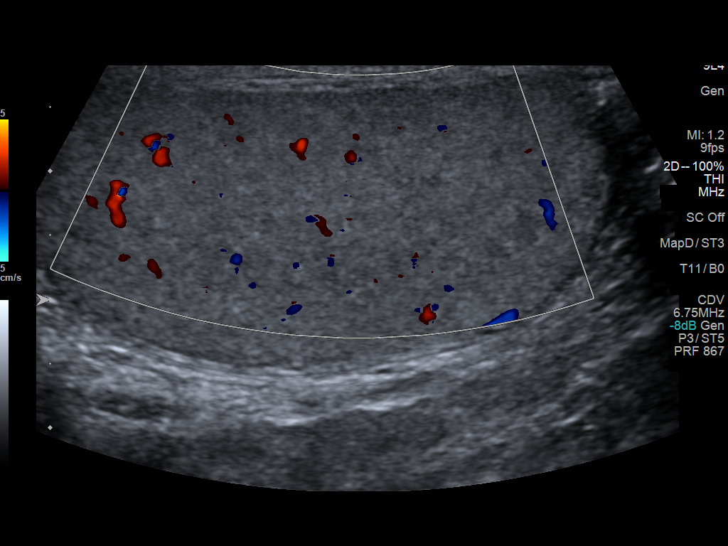
[im 31/47]
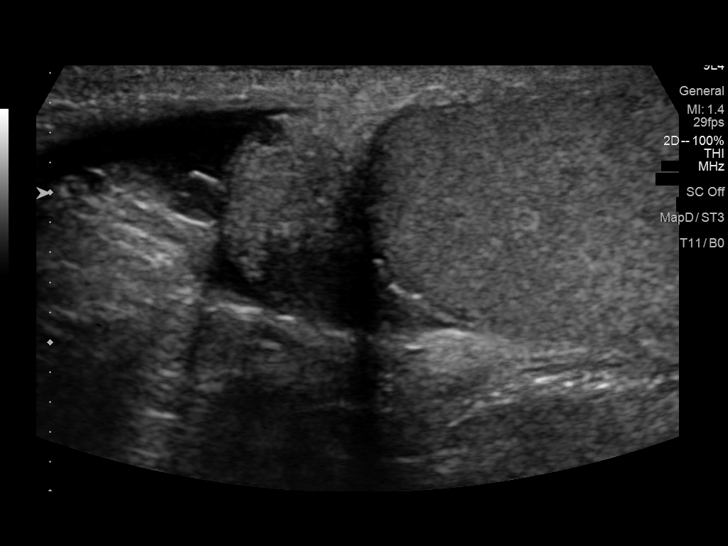
[im 35/47]
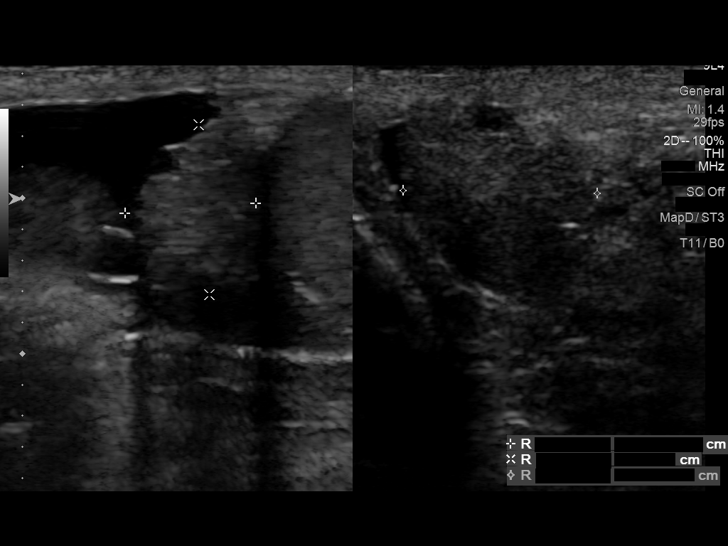
[im 39/47]
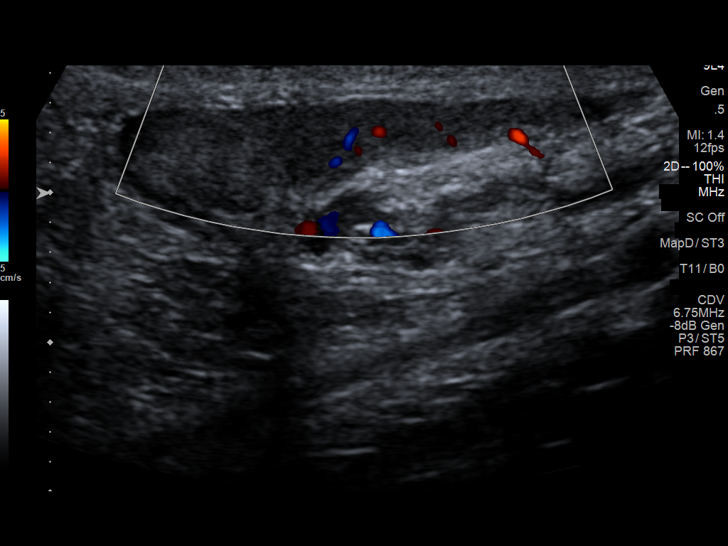
[im 43/47]
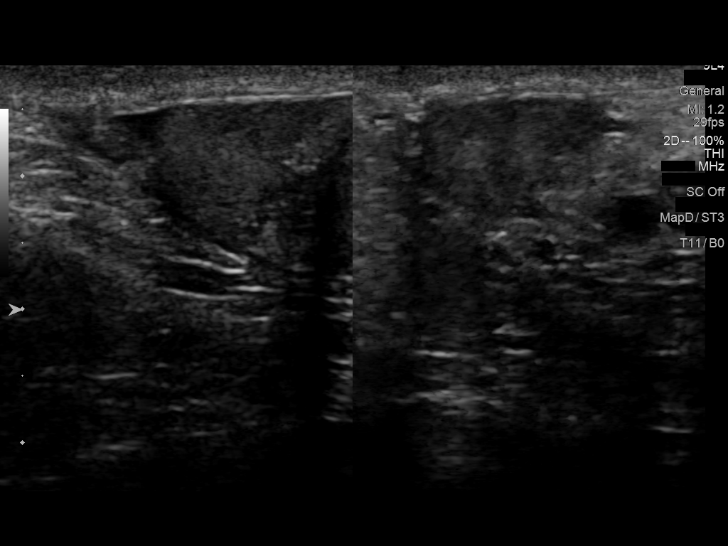
[im 47/47]
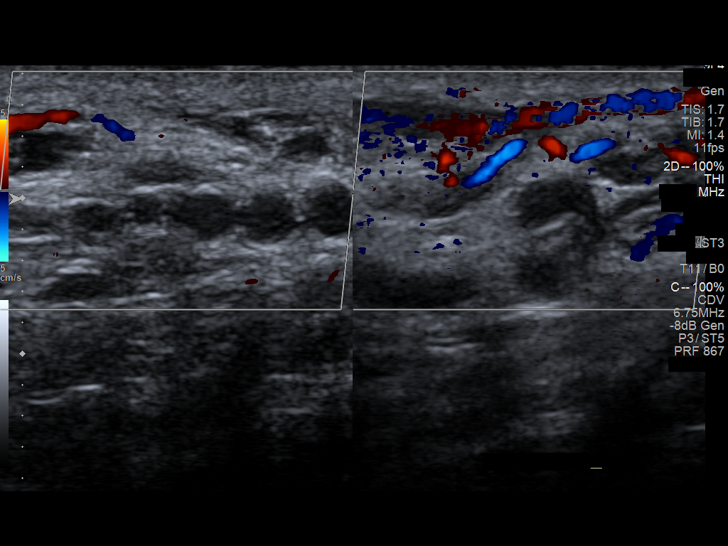

[13 of 25 positions shown; findings below may reference images not displayed]

FINDINGS: Right testicle

Measurements: 4.9 x 2.4 x 3.6 cm. Small nonspecific nonshadowing
mildly hyperechoic foci within inferior RIGHT testis, 2 x 2 x 2 mm
and 2 x 2 x 3 mm. Otherwise normal testicular echogenicity. No
additional masses or definite calcifications.

Left testicle

Measurements: 4.7 x 2.1 x 3.5 cm. Normal echogenicity without mass
or calcification. Internal blood flow present on color Doppler
imaging.

Right epididymis:  Normal in size and appearance.

Left epididymis:  Normal in size and appearance.

Hydrocele:  None visualized.

Varicocele:  None visualized.

Pulsed Doppler interrogation of both testes demonstrates normal low
resistance arterial and venous waveforms bilaterally.
IMPRESSION: Nonspecific 2 mm and 3 mm mildly hyperechoic foci within inferior
RIGHT testis, of uncertain etiology and significance; potentially
these could represent developing areas of calcification or sequela
of prior inflammation or trauma.

Follow-up ultrasound recommended in 6 months to establish stability.

Remainder of exam unremarkable.

## 2019-12-14 ENCOUNTER — Other Ambulatory Visit (HOSPITAL_COMMUNITY): Payer: Self-pay | Admitting: Cardiology

## 2020-01-01 DIAGNOSIS — H0102A Squamous blepharitis right eye, upper and lower eyelids: Secondary | ICD-10-CM | POA: Diagnosis not present

## 2020-01-01 DIAGNOSIS — H0102B Squamous blepharitis left eye, upper and lower eyelids: Secondary | ICD-10-CM | POA: Diagnosis not present

## 2020-01-01 DIAGNOSIS — H0012 Chalazion right lower eyelid: Secondary | ICD-10-CM | POA: Diagnosis not present

## 2020-01-11 ENCOUNTER — Other Ambulatory Visit (HOSPITAL_COMMUNITY): Payer: Self-pay | Admitting: Cardiology

## 2020-01-15 ENCOUNTER — Other Ambulatory Visit (HOSPITAL_COMMUNITY): Payer: Self-pay | Admitting: Internal Medicine

## 2020-01-15 ENCOUNTER — Other Ambulatory Visit (HOSPITAL_COMMUNITY): Payer: Self-pay | Admitting: Cardiology

## 2020-01-18 ENCOUNTER — Other Ambulatory Visit: Payer: Self-pay | Admitting: *Deleted

## 2020-01-18 MED ORDER — RIVAROXABAN 20 MG PO TABS: ORAL_TABLET | ORAL | 5 refills | Status: DC

## 2020-01-18 NOTE — Telephone Encounter (Signed)
Xarelto 20mg  refill request received. Pt is 39 years old, weight-136.1kg, Crea-1.23 on 03/27/2019, last seen by Dr. 03/29/2019 on 07/21/2019, Diagnosis-Afib, CrCl-153.21ml/min; Dose is appropriate based on dosing criteria. Will send in refill to requested pharmacy.

## 2020-02-08 ENCOUNTER — Other Ambulatory Visit: Payer: Self-pay

## 2020-02-08 ENCOUNTER — Encounter (HOSPITAL_COMMUNITY): Payer: Self-pay | Admitting: Internal Medicine

## 2020-02-08 ENCOUNTER — Ambulatory Visit (HOSPITAL_COMMUNITY)
Admission: RE | Admit: 2020-02-08 | Discharge: 2020-02-08 | Disposition: A | Discharge status: Home / Self Care | Payer: BC Managed Care – PPO | Source: Ambulatory Visit | Attending: Internal Medicine | Admitting: Internal Medicine

## 2020-02-08 VITALS — BP 132/80 | HR 90 | Wt 307.2 lb

## 2020-02-08 DIAGNOSIS — I5022 Chronic systolic (congestive) heart failure: Secondary | ICD-10-CM | POA: Diagnosis not present

## 2020-02-08 DIAGNOSIS — I4891 Unspecified atrial fibrillation: Secondary | ICD-10-CM | POA: Diagnosis not present

## 2020-02-08 DIAGNOSIS — Z7901 Long term (current) use of anticoagulants: Secondary | ICD-10-CM | POA: Insufficient documentation

## 2020-02-08 DIAGNOSIS — I480 Paroxysmal atrial fibrillation: Secondary | ICD-10-CM | POA: Diagnosis not present

## 2020-02-08 DIAGNOSIS — Z79899 Other long term (current) drug therapy: Secondary | ICD-10-CM | POA: Diagnosis not present

## 2020-02-08 DIAGNOSIS — I110 Hypertensive heart disease with heart failure: Secondary | ICD-10-CM | POA: Insufficient documentation

## 2020-02-08 DIAGNOSIS — I4892 Unspecified atrial flutter: Secondary | ICD-10-CM | POA: Insufficient documentation

## 2020-02-08 DIAGNOSIS — G4733 Obstructive sleep apnea (adult) (pediatric): Secondary | ICD-10-CM | POA: Diagnosis not present

## 2020-02-08 DIAGNOSIS — I739 Peripheral vascular disease, unspecified: Secondary | ICD-10-CM | POA: Insufficient documentation

## 2020-02-08 LAB — BASIC METABOLIC PANEL
Anion gap: 10 (ref 5–15)
BUN: 14 mg/dL (ref 6–20)
CO2: 23 mmol/L (ref 22–32)
Calcium: 9.6 mg/dL (ref 8.9–10.3)
Chloride: 105 mmol/L (ref 98–111)
Creatinine, Ser: 1.04 mg/dL (ref 0.61–1.24)
GFR calc Af Amer: >60 mL/min (ref >60)
GFR calc non Af Amer: >60 mL/min (ref >60)
Glucose, Bld: 83 mg/dL (ref 70–99)
Potassium: 3.7 mmol/L (ref 3.5–5.1)
Sodium: 138 mmol/L (ref 135–145)

## 2020-02-08 LAB — BRAIN NATRIURETIC PEPTIDE: B Natriuretic Peptide: 21.8 pg/mL (ref 0.0–100.0)

## 2020-02-08 NOTE — Patient Instructions (Addendum)
Labs done today, your results will be available in MyChart, we will contact you for abnormal readings.  Your provider has recommended that  you wear a Zio Patch for 7 days.  This monitor will record your heart rhythm for our review.  IF you have any symptoms while wearing the monitor please press the button.  If you have any issues with the patch or you notice a red or orange light on it please call the company at [phone].  Once you remove the patch please mail it back to the company as soon as possible so we can get the results.  Your physician has requested that you have an echocardiogram. Echocardiography is a painless test that uses sound waves to create images of your heart. It provides your doctor with information about the size and shape of your heart and how well your heart's chambers and valves are working. This procedure takes approximately one hour. There are no restrictions for this procedure.  Your physician recommends you have an echocardiogram the following week, and schedule a tele-health visit in 1 month   If you have any questions or concerns before your next appointment please send [place] a message through [place] or call our office at [phone].    TO LEAVE A MESSAGE FOR THE NURSE SELECT OPTION 2, PLEASE LEAVE A MESSAGE INCLUDING: . YOUR NAME . DATE OF BIRTH . CALL BACK NUMBER . REASON FOR CALL**this is important as we prioritize the call backs  YOU WILL RECEIVE A CALL BACK THE SAME DAY AS LONG AS YOU CALL BEFORE 4:00 PM  At the [facility], you and your health needs are our priority. As part of our continuing mission to provide you with exceptional heart care, we have created designated Provider Care Teams. These Care Teams include your primary Cardiologist (physician) and Advanced Practice Providers (APPs- Physician Assistants and Nurse Practitioners) who all work together to provide you with the care you need, when you need it.   You may see any of  the following providers on your designated Care Team at your next follow up: Marland Kitchen Dr [name] . Dr Marca Ancona . Tonye Becket, NP . Robbie Lis, PA . Karle Plumber, PharmD   Please be sure to bring in all your medications bottles to every appointment.

## 2020-02-08 NOTE — Progress Notes (Signed)
ADVANCED HF CLINIC NOTE  Patient ID: Lance [name], male   DOB: 1981-04-17, 39 y.o.   MRN: [id number] PCP: Tresa Garter, MD EP: Dr Lance [name]  HPI: Lance [name] is a 39 y.o. AAM with PMH significant for PAF, HTN, OSA, and Chronic Systolic Heart Failure     Admitted 08/2012 and found to be in Afib with RVR. He was started on amiodarone and became profoundly hypotensive requiring levophed and IVF.  He underwent TEE and failed DCCV on 3/9.  Dr. [name] placed him on Tikosyn and he had polymorphic VT.  Transitioned to oral amiodarone with good rate response.  Echo during admission showed EF 10% with diffuse hypokinesis.  He was diuresed and discharged on amiodarone, xarelto, digoxin, ramipril, lopressor and lasix.    09/14/12 S/P A-Flutter ablation. EF subsequently returned to normal.   Readmitted in July 2017 with HF and found to have recurrent AF with RVR. EF back down to 25-30%. Underwent attempt at [facility] on 01/03/16. Maintained NSR for 2 mintues and then back to AF. He was placed on IV amiodarone with close monitoring of BP along with oral amiodarone for faster loading. He eventually converted back to NSR on IV amiodarone. D/c weight 300 pounds.   Developed recurrent AF/AFL in 9/18 and underwent DC-CV on 03/08/17. Underwent repeat AF ablation on 10/20  He presents today for regular follow up. Now off amio. Says he notices a fast HR about 2-3 times per day and last 2-20 mins. It is associated with increased periods of stress. Has Alive[employer] and appears to have some episodes of AF. Remains on Xarelto. Has had a rough year. 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 No edema, orthopnea or PND.   Echo 8/20 EF 55-60% RV normal. Personally reviewed  Echo 10/17 EF 35-40% ECHO 08/2016 EF 50-55%. ECHO  03/03/2017 EF 50-55%   Review of systems complete and found to be negative unless listed in HPI.    Past Medical History:  Diagnosis Date  . Acute  systolic heart failure (HCC) 09/14/2012  . Atrial flutter [facility])    s/p ablation by Dr [name]  . Medical history non-contributory   . Neuromuscular disorder (HCC)   . Onychomycosis   . OSA on CPAP   . Peripheral vascular disease (HCC)   . Persistent atrial fibrillation (HCC) 2003    Current Outpatient Medications  Medication Sig Dispense Refill  . acetaminophen (TYLENOL) 500 MG tablet Take 1,000 mg by mouth every 6 (six) hours as needed for headache.    . fexofenadine (ALLEGRA) 30 MG tablet Take 30 mg by mouth at bedtime as needed.     . furosemide (LASIX) 40 MG tablet Take 1 tablet by mouth twice daily 180 tablet 0  . losartan (COZAAR) 25 MG tablet Take 1 tablet by mouth once daily 90 tablet 0  . metoprolol succinate (TOPROL-XL) 25 MG 24 hr tablet Take 1 tablet (25 mg total) by mouth daily. 90 tablet 2  . potassium chloride SA (KLOR-CON) 20 MEQ tablet Take 1 tablet by mouth once daily 30 tablet 5  . rivaroxaban (XARELTO) 20 MG TABS tablet TAKE 1 TABLET BY MOUTH ONCE DAILY WITH SUPPER 30 tablet 5  . triamcinolone cream (KENALOG) 0.1 % Apply 1 application topically 2 (two) times daily. (Patient not taking: Reported on 02/08/2020) 45 g 1   No current facility-administered medications for this encounter.    Vitals:   02/08/20 1537  BP: 132/80  Pulse: 90  SpO2: 100%  Weight: (!) 139.3 kg (307 lb 3.2 oz)    Wt Readings from Last 3 Encounters:  02/08/20 (!) 139.3 kg (307 lb 3.2 oz)  07/21/19 136.1 kg (300 lb)  04/20/19 (!) 138.9 kg (306 lb 3.2 oz)    PHYSICAL EXAM: General:  Well appearing. No resp difficulty HEENT: normal Neck: supple. no JVD. Carotids 2+ bilat; no bruits. No lymphadenopathy or thryomegaly appreciated. Cor: PMI nondisplaced. Regular rate & rhythm. No rubs, gallops or murmurs. Lungs: clear Abdomen: obese soft, nontender, nondistended. No hepatosplenomegaly. No bruits or masses. Good bowel sounds. Extremities: no cyanosis, clubbing, rash, edema Neuro: alert &  orientedx3, cranial nerves grossly intact. moves all 4 extremities w/o difficulty. Affect stresssed  ECG; NSR 80 Personally reviewed  ASSESSMENT & PLAN: 1. PAF/Atrial flutter - s/p AFL ablation 09/2012 - s/p AF ablation 9/15 - s/p DC-CV 7/17 and 03/08/17 - Repeat AF ablation in 10/20 - He is maintaining NSR of all AAs. Continues to have some palpitations. Will place Zio patch for 7 days to assess AF burden. F/u in AF Clinic. - ChadsVAsc = 2 (HF, HTN)  - Continue Xarelto. No bleeding 2. Chronic systolic HF, likely tachy mediated -> resolved - Echo EF 25-30% 8/17 -> echo 10/17 EF 35-40%-->08/2016 EF 50-55% -->03/03/2017 EF 50-55%  - Echo 8/20 EF 55-60% RV ok  - NYHA I - Volume status looks good - Will repeat echo  - He remains very stressed at work and says he needs FMLA exceptions for HF. I explained to him that his HF has resolved with treatment of his AF and I don't think we can justify any HF exemptions at this point although he certainly may need exemptions for stress and other ailments though his PCP. I will check repeat echo and BNP to assure that his HF has in fact resolved.   3. OSA  - Continue CPAP 4. Morbid obesity - Continue weight loss efforts.  [name], MD  3:47 PM      .

## 2020-02-15 ENCOUNTER — Other Ambulatory Visit: Payer: Self-pay

## 2020-02-15 ENCOUNTER — Ambulatory Visit (HOSPITAL_COMMUNITY)
Admission: RE | Admit: 2020-02-15 | Discharge: 2020-02-15 | Disposition: A | Discharge status: Home / Self Care | Payer: BC Managed Care – PPO | Source: Ambulatory Visit | Attending: Cardiology | Admitting: Cardiology

## 2020-02-15 DIAGNOSIS — G4730 Sleep apnea, unspecified: Secondary | ICD-10-CM | POA: Diagnosis not present

## 2020-02-15 DIAGNOSIS — G709 Myoneural disorder, unspecified: Secondary | ICD-10-CM | POA: Diagnosis not present

## 2020-02-15 DIAGNOSIS — I4891 Unspecified atrial fibrillation: Secondary | ICD-10-CM | POA: Diagnosis not present

## 2020-02-15 DIAGNOSIS — I252 Old myocardial infarction: Secondary | ICD-10-CM | POA: Insufficient documentation

## 2020-02-15 DIAGNOSIS — I5022 Chronic systolic (congestive) heart failure: Secondary | ICD-10-CM

## 2020-02-15 DIAGNOSIS — I4892 Unspecified atrial flutter: Secondary | ICD-10-CM | POA: Insufficient documentation

## 2020-02-15 DIAGNOSIS — I739 Peripheral vascular disease, unspecified: Secondary | ICD-10-CM | POA: Insufficient documentation

## 2020-02-15 DIAGNOSIS — I428 Other cardiomyopathies: Secondary | ICD-10-CM | POA: Insufficient documentation

## 2020-02-15 LAB — ECHOCARDIOGRAM COMPLETE
Area-P 1/2: 2.34 cm2
Calc EF: 51.7 %
S' Lateral: 3.5 cm
Single Plane A2C EF: 53.5 %
Single Plane A4C EF: 49.9 %

## 2020-02-15 NOTE — Progress Notes (Signed)
  Echocardiogram 2D Echocardiogram has been performed.  Lance [name] M 02/15/2020, 10:44 AM

## 2020-02-21 ENCOUNTER — Telehealth (HOSPITAL_COMMUNITY): Payer: Self-pay

## 2020-02-21 DIAGNOSIS — I4819 Other persistent atrial fibrillation: Secondary | ICD-10-CM | POA: Diagnosis not present

## 2020-02-21 NOTE — Telephone Encounter (Signed)
   Samara Snide, RN  02/21/2020 1:28 PM EDT Back to Top    Patient advised and verbalized understanding   Samara Snide, RN  02/20/2020 5:06 PM EDT     lmtrc   Samara Snide, RN  02/19/2020 2:08 PM EDT     lmtrc

## 2020-02-21 NOTE — Telephone Encounter (Signed)
-----   Message from Dolores Patty, MD sent at 02/18/2020  2:46 PM EDT ----- Heart function is normal.

## 2020-03-07 ENCOUNTER — Other Ambulatory Visit: Payer: Self-pay

## 2020-03-07 ENCOUNTER — Ambulatory Visit (HOSPITAL_COMMUNITY)
Admission: RE | Admit: 2020-03-07 | Discharge: 2020-03-07 | Disposition: A | Discharge status: Home / Self Care | Payer: BC Managed Care – PPO | Source: Ambulatory Visit | Attending: Internal Medicine | Admitting: Internal Medicine

## 2020-03-07 DIAGNOSIS — I5021 Acute systolic (congestive) heart failure: Secondary | ICD-10-CM

## 2020-03-07 DIAGNOSIS — I4819 Other persistent atrial fibrillation: Secondary | ICD-10-CM

## 2020-03-07 DIAGNOSIS — I5022 Chronic systolic (congestive) heart failure: Secondary | ICD-10-CM

## 2020-03-07 DIAGNOSIS — G4733 Obstructive sleep apnea (adult) (pediatric): Secondary | ICD-10-CM

## 2020-03-07 NOTE — Progress Notes (Signed)
Heart Failure TeleHealth Note  Due to national recommendations of social distancing due to COVID 19, Audio/video telehealth visit is felt to be most appropriate for this patient at this time.  See MyChart message from today for patient consent regarding telehealth for [facility]. The patient was identified personally using two identifiers.   Date:  03/07/2020   ID:  Lance [name], DOB 03-25-1981, MRN [id number]  Location: Home  Provider location: [place] [facility] Type of Visit: Established patient  PCP:  Plotnikov, Georgina Quint, MD  Cardiologist:  No primary care provider on file. Primary HF: Julies Carmickle  Chief Complaint: Heart Failure follow-up   History of Present Illness:  Lance [name] is a 39 y.o. AAM with PMH significant for PAF, HTN, OSA, and Chronic Systolic Heart Failure     Admitted 08/2012 and found to be in Afib with RVR. He was started on amiodarone and became profoundly hypotensive requiring levophed and IVF.  He underwent TEE and failed DCCV on 3/9.  Dr. [name] placed him on Tikosyn and he had polymorphic VT.  Transitioned to oral amiodarone with good rate response.  Echo during admission showed EF 10% with diffuse hypokinesis.  He was diuresed and discharged on amiodarone, xarelto, digoxin, ramipril, lopressor and lasix.    09/14/12 S/P A-Flutter ablation. EF subsequently returned to normal.   Readmitted in July 2017 with HF and found to have recurrent AF with RVR. EF back down to 25-30%. Underwent attempt at [facility] on 01/03/16. Maintained NSR for 2 mintues and then back to AF. He was placed on IV amiodarone with close monitoring of BP along with oral amiodarone for faster loading. He eventually converted back to NSR on IV amiodarone. D/c weight 300 pounds.   Developed recurrent AF/AFL in 9/18 and underwent DC-CV on 03/08/17. Underwent repeat AF ablation on 10/20  He presents via audio/video conferencing for a telehealth visit today. Says he is doing  well. Feels good trying to be more active. Still with some palpitations in the mornings. Use a FitBit to follow.  Struggling with stress at work on his new job. No SOB, edema, orthopnea or PND.   Echo 9/21 EF 60-65% Personally reviewed  Zio 02/08/20 1. Predominant rhythm was sinus - min HR of 59 bpm, max HR of 166 bpm, and avg HR of 82 2. Atrial Fibrillation occurred (2% burden), ranging from 89-166 bpm (avg of 121 bpm), the longest lasting 2 hours 49 mins with an avg rate of 121 bpm.  3. Frequent PACs (19.6%) 4. Rare PVCs.    Echo 8/20 EF 55-60% RV normal. Personally reviewed  Echo 10/17 EF 35-40% ECHO 08/2016 EF 50-55%. ECHO  03/03/2017 EF 50-55%     Lance [name] denies symptoms worrisome for COVID 19.   Past Medical History:  Diagnosis Date  . Acute systolic heart failure (HCC) 09/14/2012  . Atrial flutter [facility])    s/p ablation by Dr [name]  . Medical history non-contributory   . Neuromuscular disorder (HCC)   . Onychomycosis   . OSA on CPAP   . Peripheral vascular disease (HCC)   . Persistent atrial fibrillation (HCC) 2003   Past Surgical History:  Procedure Laterality Date  . ATRIAL FIBRILLATION ABLATION N/A 02/15/2014   Procedure: ATRIAL FIBRILLATION ABLATION;  Surgeon: Gardiner Rhyme, MD;  Location: [facility] CATH LAB;  Service: Cardiovascular;  Laterality: N/A;  . ATRIAL FIBRILLATION ABLATION N/A 03/30/2019   Procedure: ATRIAL FIBRILLATION ABLATION;  Surgeon: Hillis Range, MD;  Location: [facility] INVASIVE CV LAB;  Service: Cardiovascular;  Laterality: N/A;  . ATRIAL FLUTTER ABLATION  09/14/2012   by Dr [name]  . ATRIAL FLUTTER ABLATION N/A 09/14/2012   Procedure: ATRIAL FLUTTER ABLATION;  Surgeon: Marinus Maw, MD;  Location: [facility] CATH LAB;  Service: Cardiovascular;  Laterality: N/A;  . CARDIAC CATHETERIZATION    . CARDIOVERSION N/A 08/14/2012   Procedure: CARDIOVERSION;  Surgeon: Dolores Patty, MD;  Location: [facility] OR;  Service: Cardiovascular;  Laterality: N/A;  . CARDIOVERSION  N/A 01/03/2016   Procedure: CARDIOVERSION;  Surgeon: Pricilla Riffle, MD;  Location: [facility] ENDOSCOPY;  Service: Cardiovascular;  Laterality: N/A;  . CARDIOVERSION N/A 03/08/2017   Procedure: CARDIOVERSION;  Surgeon: Dolores Patty, MD;  Location: [facility] ENDOSCOPY;  Service: Cardiovascular;  Laterality: N/A;  . MANDIBLE SURGERY     underbite correction  . TEE WITHOUT CARDIOVERSION N/A 02/14/2014   Procedure: TRANSESOPHAGEAL ECHOCARDIOGRAM (TEE);  Surgeon: Quintella Reichert, MD;  Location: [facility] ENDOSCOPY;  Service: Cardiovascular;  Laterality: N/A;  . TEE WITHOUT CARDIOVERSION N/A 01/03/2016   Procedure: TRANSESOPHAGEAL ECHOCARDIOGRAM (TEE);  Surgeon: Pricilla Riffle, MD;  Location: [facility] ENDOSCOPY;  Service: Cardiovascular;  Laterality: N/A;     Current Outpatient Medications  Medication Sig Dispense Refill  . acetaminophen (TYLENOL) 500 MG tablet Take 1,000 mg by mouth every 6 (six) hours as needed for headache.    . fexofenadine (ALLEGRA) 30 MG tablet Take 30 mg by mouth at bedtime as needed.     . furosemide (LASIX) 40 MG tablet Take 1 tablet by mouth twice daily 180 tablet 0  . losartan (COZAAR) 25 MG tablet Take 1 tablet by mouth once daily 90 tablet 0  . metoprolol succinate (TOPROL-XL) 25 MG 24 hr tablet Take 1 tablet (25 mg total) by mouth daily. 90 tablet 2  . potassium chloride SA (KLOR-CON) 20 MEQ tablet Take 1 tablet by mouth once daily 30 tablet 5  . rivaroxaban (XARELTO) 20 MG TABS tablet TAKE 1 TABLET BY MOUTH ONCE DAILY WITH SUPPER 30 tablet 5  . triamcinolone cream (KENALOG) 0.1 % Apply 1 application topically 2 (two) times daily. (Patient not taking: Reported on 02/08/2020) 45 g 1   No current facility-administered medications for this encounter.    Allergies:   Amiodarone, Altace [ramipril], Azithromycin, Protonix [pantoprazole sodium], Fish allergy, and Metoprolol succinate   Social History:  The patient  reports that he quit smoking about 10 years ago. His smoking use included cigarettes  and cigars. He quit after 3.00 years of use. He has never used smokeless tobacco. He reports previous alcohol use. He reports previous drug use. Drug: Marijuana.   Family History:  The patient's family history includes Alcohol abuse in his father; Depression in his father; Diabetes in his mother; Hypertension in his mother; Osteoarthritis in his father; Varicose Veins in his mother.   ROS:  Please see the history of present illness.   All other systems are personally reviewed and negative.   Exam:  (Video/Tele Health Call; Exam is subjective and or/visual.) General:  Speaks in full sentences. No resp difficulty. Lungs: Normal respiratory effort with conversation.  Abdomen: Non-distended per patient report Extremities: Pt denies edema. Neuro: Alert & oriented x 3.   Recent Labs: 03/16/2019: ALT 33 03/27/2019: Hemoglobin 15.4; Platelets 267 02/08/2020: B Natriuretic Peptide 21.8; BUN 14; Creatinine, Ser 1.04; Potassium 3.7; Sodium 138  Personally reviewed   Wt Readings from Last 3 Encounters:  02/08/20 (!) 139.3 kg (307 lb 3.2 oz)  07/21/19  136.1 kg (300 lb)  04/20/19 (!) 138.9 kg (306 lb 3.2 oz)      ASSESSMENT AND PLAN:  1. PAF/Atrial flutter - s/p AFL ablation 09/2012 - s/p AF ablation 9/15 - s/p DC-CV 7/17 and 03/08/17 - Repeat AF ablation in 10/20 - He is maintaining NSR of all AAs. Continues to have some palpitations.  - ZioPatch 9/21 - 2% AF - ChadsVAsc = 2 (HF, HTN)  - Continue Xarelto. No bleeding - Can graduate HF Clinic. Follow up with AF Clinic 2. Chronic systolic HF, likely tachy mediated -> resolved - Echo EF 25-30% 8/17 -> echo 10/17 EF 35-40%-->08/2016 EF 50-55% -->03/03/2017 EF 50-55%  - Echo 8/20 EF 55-60% RV ok - Echo 9/21 60-65% RV ok - Stable NYHA I - Volume status looks good. Can stop lasix/potassium and use only as needed.   3. OSA  - Encouraged being compliant with CPAP 4. Morbid obesity - Continue weight loss efforts.   COVID screen The patient does  not have any symptoms that suggest any further testing/ screening at this time.  Social distancing reinforced today.  Recommended follow-up:  As above  Relevant cardiac medications were reviewed at length with the patient today.   The patient does not have concerns regarding their medications at this time.   The following changes were made today:  As above  Today, I have spent 17 minutes with the patient with telehealth technology discussing the above issues .    Signed, [name], MD  03/07/2020 3:48 PM  [facility] [address] Heart [facility] Vascular [place] [address] [phone] (office) [phone] (fax)

## 2020-03-08 ENCOUNTER — Encounter (HOSPITAL_COMMUNITY): Payer: Self-pay | Admitting: *Deleted

## 2020-03-08 NOTE — Addendum Note (Signed)
Encounter addended by: Noralee Space, RN on: 03/08/2020 11:29 AM  Actions taken: Clinical Note Signed, Order list changed, Medication long-term status modified

## 2020-03-08 NOTE — Progress Notes (Signed)
AVS sent to pt via mychart with instructions per Dr [name]:  [name], Lance Buckles, MD  You 19 hours ago (4:02 PM)   Stop lasix and potassium.   F/u prn

## 2020-03-08 NOTE — Patient Instructions (Signed)
Stop Furosemide  Stop Potassium   Congratulations you have graduated from the Heart Failure clinic, please follow-up with [place] AS NEEDED

## 2020-03-27 ENCOUNTER — Encounter: Payer: Self-pay | Admitting: Internal Medicine

## 2020-03-27 ENCOUNTER — Ambulatory Visit (INDEPENDENT_AMBULATORY_CARE_PROVIDER_SITE_OTHER): Payer: BC Managed Care – PPO | Admitting: Internal Medicine

## 2020-03-27 ENCOUNTER — Other Ambulatory Visit: Payer: Self-pay

## 2020-03-27 VITALS — BP 118/70 | HR 80 | Temp 98.4°F | Ht 74.0 in | Wt 319.6 lb

## 2020-03-27 DIAGNOSIS — Z566 Other physical and mental strain related to work: Secondary | ICD-10-CM

## 2020-03-27 DIAGNOSIS — Z23 Encounter for immunization: Secondary | ICD-10-CM

## 2020-03-27 DIAGNOSIS — F419 Anxiety disorder, unspecified: Secondary | ICD-10-CM | POA: Diagnosis not present

## 2020-03-27 DIAGNOSIS — I428 Other cardiomyopathies: Secondary | ICD-10-CM | POA: Diagnosis not present

## 2020-03-27 DIAGNOSIS — Z0000 Encounter for general adult medical examination without abnormal findings: Secondary | ICD-10-CM | POA: Diagnosis not present

## 2020-03-27 HISTORY — DX: Anxiety disorder, unspecified: F41.9

## 2020-03-27 MED ORDER — FUROSEMIDE 40 MG PO TABS: 40.0000 mg | ORAL_TABLET | Freq: Every day | ORAL | 3 refills | Status: DC

## 2020-03-27 MED ORDER — BUSPIRONE HCL 10 MG PO TABS: 5.0000 mg | ORAL_TABLET | Freq: Two times a day (BID) | ORAL | 1 refills | Status: DC

## 2020-03-27 MED ORDER — POTASSIUM CHLORIDE CRYS ER 20 MEQ PO TBCR: 20.0000 meq | EXTENDED_RELEASE_TABLET | Freq: Two times a day (BID) | ORAL | 3 refills | Status: DC | PRN

## 2020-03-27 NOTE — Assessment & Plan Note (Addendum)
Worse Will try Buspar low dose

## 2020-03-27 NOTE — Assessment & Plan Note (Signed)
Better now 

## 2020-03-27 NOTE — Progress Notes (Signed)
Subjective:  Patient ID: Lance [name], male    DOB: 12/07/1980  Age: 39 y.o. MRN: [id number]  CC: Annual Exam   HPI Lance [name] presents for a well exam C/o stress at work at [employer]; recently had a promotion C/o wt gain  Outpatient Medications Prior to Visit  Medication Sig Dispense Refill  . acetaminophen (TYLENOL) 500 MG tablet Take 1,000 mg by mouth every 6 (six) hours as needed for headache.    . fexofenadine (ALLEGRA) 30 MG tablet Take 30 mg by mouth at bedtime as needed.     . furosemide (LASIX) 40 MG tablet Take 40 mg by mouth.    . losartan (COZAAR) 25 MG tablet Take 1 tablet by mouth once daily 90 tablet 0  . metoprolol succinate (TOPROL-XL) 25 MG 24 hr tablet Take 1 tablet (25 mg total) by mouth daily. 90 tablet 2  . potassium chloride SA (KLOR-CON) 20 MEQ tablet Take 20 mEq by mouth 2 (two) times daily as needed.    . rivaroxaban (XARELTO) 20 MG TABS tablet TAKE 1 TABLET BY MOUTH ONCE DAILY WITH SUPPER 30 tablet 5  . triamcinolone cream (KENALOG) 0.1 % Apply 1 application topically 2 (two) times daily. (Patient not taking: Reported on 02/08/2020) 45 g 1   No facility-administered medications prior to visit.    ROS: Review of Systems  Constitutional: Positive for unexpected weight change. Negative for appetite change and fatigue.  HENT: Negative for congestion, nosebleeds, sneezing, sore throat and trouble swallowing.   Eyes: Negative for itching and visual disturbance.  Respiratory: Negative for cough.   Cardiovascular: Negative for chest pain, palpitations and leg swelling.  Gastrointestinal: Negative for abdominal distention, blood in stool, diarrhea and nausea.  Genitourinary: Negative for frequency and hematuria.  Musculoskeletal: Negative for back pain, gait problem, joint swelling and neck pain.  Skin: Negative for rash.  Neurological: Negative for dizziness, tremors, speech difficulty and weakness.  Psychiatric/Behavioral: Negative for agitation, dysphoric  mood and sleep disturbance. The patient is not nervous/anxious.     Objective:  BP 118/70 (BP Location: Left Arm, Patient Position: Sitting, Cuff Size: Large)   Pulse 80   Temp 98.4 F (36.9 C) (Oral)   Ht 6\' 2"  (1.88 m)   Wt (!) 319 lb 9.6 oz (145 kg)   SpO2 96%   BMI 41.03 kg/m   BP Readings from Last 3 Encounters:  03/27/20 118/70  02/08/20 132/80  04/20/19 (!) 118/50    Wt Readings from Last 3 Encounters:  03/27/20 (!) 319 lb 9.6 oz (145 kg)  02/08/20 (!) 307 lb 3.2 oz (139.3 kg)  07/21/19 300 lb (136.1 kg)    Physical Exam Constitutional:      General: He is not in acute distress.    Appearance: He is well-developed. He is obese.     Comments: NAD  Eyes:     Conjunctiva/sclera: Conjunctivae normal.     Pupils: Pupils are equal, round, and reactive to light.  Neck:     Thyroid: No thyromegaly.     Vascular: No JVD.  Cardiovascular:     Rate and Rhythm: Normal rate and regular rhythm.     Heart sounds: Normal heart sounds. No murmur heard.  No friction rub. No gallop.   Pulmonary:     Effort: Pulmonary effort is normal. No respiratory distress.     Breath sounds: Normal breath sounds. No wheezing or rales.  Chest:     Chest wall: No tenderness.  Abdominal:  General: Bowel sounds are normal. There is no distension.     Palpations: Abdomen is soft. There is no mass.     Tenderness: There is no abdominal tenderness. There is no guarding or rebound.  Musculoskeletal:        General: No tenderness. Normal range of motion.     Cervical back: Normal range of motion.  Lymphadenopathy:     Cervical: No cervical adenopathy.  Skin:    General: Skin is warm and dry.     Findings: No rash.  Neurological:     Mental Status: He is alert and oriented to person, place, and time.     Cranial Nerves: No cranial nerve deficit.     Motor: No abnormal muscle tone.     Coordination: Coordination normal.     Gait: Gait normal.     Deep Tendon Reflexes: Reflexes are  normal and symmetric.  Psychiatric:        Behavior: Behavior normal.        Thought Content: Thought content normal.        Judgment: Judgment normal.    I spent 22 minutes in addition to time for CPX wellness examination in preparing to see the patient by review of recent labs, imaging and procedures, obtaining and reviewing separately obtained history, communicating with the patient, ordering medications, tests or procedures, and documenting clinical information in the EHR including the differential diagnosis, treatment, and any further evaluation and other management of stress, obesity         Lab Results  Component Value Date   WBC 7.1 03/27/2019   HGB 15.4 03/27/2019   HCT 47.4 03/27/2019   PLT 267 03/27/2019   GLUCOSE 83 02/08/2020   CHOL 128 03/16/2019   TRIG 63.0 03/16/2019   HDL 36.40 (L) 03/16/2019   LDLCALC 79 03/16/2019   ALT 33 03/16/2019   AST 23 03/16/2019   NA 138 02/08/2020   K 3.7 02/08/2020   CL 105 02/08/2020   CREATININE 1.04 02/08/2020   BUN 14 02/08/2020   CO2 23 02/08/2020   TSH 0.997 01/12/2019   PSA 0.45 02/01/2013   INR 1.07 01/01/2016    No results found.  Assessment & Plan:    Sonda Primes, MD

## 2020-03-27 NOTE — Assessment & Plan Note (Signed)

## 2020-03-27 NOTE — Patient Instructions (Addendum)
Joe Dispenza "Breaking the Habbit of Being Yourself"  Sign up for [employer] ( via [employer] on your phone or your ipad). If you don't have a Engineering geologist card  - go to [employer] branch. They will set you up in 15 minutes. It is free. You can check out books to read and to listen, check out magazines and newspapers, movies etc.       These suggestions will probably help you to improve your metabolism if you are not overweight and to lose weight if you are overweight: 1.  Reduce your consumption of sugars and starches.  Eliminate high fructose corn syrup from your diet.  Reduce your consumption of processed foods.  For desserts try to have seasonal fruits, berries, nuts, cheeses or dark chocolate with more than 70% cacao. 2.  Do not snack 3.  You do not have to eat breakfast.  If you choose to have breakfast - eat plain greek yogurt, eggs, oatmeal (without sugar) - use honey if you need to. 4.  Drink water, freshly brewed unsweetened tea (green, black or herbal) or coffee.  Do not drink sodas including diet sodas , juices, beverages sweetened with artificial sweeteners. 5.  Reduce your consumption of refined grains. 6.  Avoid protein drinks such as Optifast, Slim fast etc. Eat chicken, fish, meat, dairy and beans for your sources of protein. 7.  Natural unprocessed fats like cold pressed virgin olive oil, butter, coconut oil are good for you.  Eat avocados. 8.  Increase your consumption of fiber.  Fruits, berries, vegetables, whole grains, flaxseed, chia seeds, beans, popcorn, nuts, oatmeal are good sources of fiber 9.  Use vinegar in your diet, i.e. apple cider vinegar, red wine or balsamic vinegar 10.  You can try fasting.  For example you can skip breakfast and lunch every other day (24-hour fast) 11.  Stress reduction, good night sleep, relaxation, meditation, yoga and other physical activity is likely to help you to maintain low weight too. 12.  If you drink alcohol, limit your alcohol  intake to no more than 2 drinks a day.   Mediterranean diet is good for you. ([employer] Leodis Binet has a typical Mediterranean cuisine menu) The Mediterranean diet is a way of eating based on the traditional cuisine of countries bordering the [employer] [place]. While there is no single definition of the Mediterranean diet, it is typically high in vegetables, fruits, whole grains, beans, nut and seeds, and olive oil. The main components of Mediterranean diet include: Marland Kitchen Daily consumption of vegetables, fruits, whole grains and healthy fats  . Weekly intake of fish, poultry, beans and eggs  . Moderate portions of dairy products  . Limited intake of red meat Other important elements of the Mediterranean diet are sharing meals with family and friends, enjoying a glass of red wine and being physically active. Health benefits of a Mediterranean diet: A traditional Mediterranean diet consisting of large quantities of fresh fruits and vegetables, nuts, fish and olive oil--coupled with physical activity--can reduce your risk of serious mental and physical health problems by: Preventing heart disease and strokes. Following a Mediterranean diet limits your intake of refined breads, processed foods, and red meat, and encourages drinking red wine instead of hard liquor--all factors that can help prevent heart disease and stroke. Keeping you agile. If you're an older adult, the nutrients gained with a Mediterranean diet may reduce your risk of developing muscle weakness and other signs of frailty by about 70 percent. Reducing the risk  of Alzheimer's. Research suggests that the Mediterranean diet may improve cholesterol, blood sugar levels, and overall blood vessel health, which in turn may reduce your risk of Alzheimer's disease or dementia. Halving the risk of Parkinson's disease. The high levels of antioxidants in the Mediterranean diet can prevent cells from undergoing a damaging process called oxidative stress,  thereby cutting the risk of Parkinson's disease in half. Increasing longevity. By reducing your risk of developing heart disease or cancer with the Mediterranean diet, you're reducing your risk of death at any age by 20%. Protecting against type 2 diabetes. A Mediterranean diet is rich in fiber which digests slowly, prevents huge swings in blood sugar, and can help you maintain a healthy weight.    Cabbage soup recipe that will not make you gain weight: Take 1 small head of cabbage, 1 average pack of celery, 4 green peppers, 4 onions, 2 cans diced tomatoes (they are not available without salt), salt and spices to taste.  Chop cabbage, celery, peppers and onions.  And tomatoes and 2-2.5 liters (2.5 quarts) of water so that it would just cover the vegetables.  Bring to boil.  Add spices and salt.  Turn heat to low/medium and simmer for 20-25 minutes.  Naturally, you can make a smaller batch and change some of the ingredients.

## 2020-03-27 NOTE — Assessment & Plan Note (Signed)
F/u w/Dr Bensimon 

## 2020-03-28 LAB — CBC WITH DIFFERENTIAL/PLATELET
Basophils Absolute: 0.1 10*3/uL (ref 0.0–0.1)
Basophils Relative: 1.3 % (ref 0.0–3.0)
Eosinophils Absolute: 0.1 10*3/uL (ref 0.0–0.7)
Eosinophils Relative: 2.2 % (ref 0.0–5.0)
HCT: 40.2 % (ref 39.0–52.0)
Hemoglobin: 13.7 g/dL (ref 13.0–17.0)
Lymphocytes Relative: 29.5 % (ref 12.0–46.0)
Lymphs Abs: 1.7 10*3/uL (ref 0.7–4.0)
MCHC: 34.1 g/dL (ref 30.0–36.0)
MCV: 89.5 fl (ref 78.0–100.0)
Monocytes Absolute: 0.6 10*3/uL (ref 0.1–1.0)
Monocytes Relative: 11.2 % (ref 3.0–12.0)
Neutro Abs: 3.2 10*3/uL (ref 1.4–7.7)
Neutrophils Relative %: 55.8 % (ref 43.0–77.0)
Platelets: 240 10*3/uL (ref 150.0–400.0)
RBC: 4.5 Mil/uL (ref 4.22–5.81)
RDW: 13.5 % (ref 11.5–15.5)
WBC: 5.7 10*3/uL (ref 4.0–10.5)

## 2020-03-28 LAB — LIPID PANEL
Cholesterol: 126 mg/dL (ref 0–200)
HDL: 33.8 mg/dL — ABNORMAL LOW (ref >39.00)
LDL Cholesterol: 70 mg/dL (ref 0–99)
NonHDL: 91.97
Total CHOL/HDL Ratio: 4
Triglycerides: 108 mg/dL (ref 0.0–149.0)
VLDL: 21.6 mg/dL (ref 0.0–40.0)

## 2020-03-28 LAB — URINALYSIS
Bilirubin Urine: NEGATIVE
Hgb urine dipstick: NEGATIVE
Ketones, ur: NEGATIVE
Leukocytes,Ua: NEGATIVE
Nitrite: NEGATIVE
Specific Gravity, Urine: 1.02 (ref 1.000–1.030)
Total Protein, Urine: NEGATIVE
Urine Glucose: NEGATIVE
Urobilinogen, UA: 0.2 (ref 0.0–1.0)
pH: 6.5 (ref 5.0–8.0)

## 2020-03-28 LAB — TSH: TSH: 1.69 u[IU]/mL (ref 0.35–4.50)

## 2020-03-28 LAB — PSA: PSA: 0.27 ng/mL (ref 0.10–4.00)

## 2020-03-28 LAB — VITAMIN B12: Vitamin B-12: 272 pg/mL (ref 211–911)

## 2020-03-28 LAB — VITAMIN D 25 HYDROXY (VIT D DEFICIENCY, FRACTURES): VITD: 14.33 ng/mL — ABNORMAL LOW (ref 30.00–100.00)

## 2020-03-29 ENCOUNTER — Other Ambulatory Visit: Payer: Self-pay | Admitting: Internal Medicine

## 2020-03-29 MED ORDER — VITAMIN D3 1.25 MG (50000 UT) PO CAPS: 1.0000 | ORAL_CAPSULE | ORAL | 0 refills | Status: DC

## 2020-03-29 MED ORDER — B COMPLEX PLUS PO TABS: 1.0000 | ORAL_TABLET | Freq: Every day | ORAL | 3 refills | Status: DC

## 2020-04-01 NOTE — Assessment & Plan Note (Signed)
Discussed wt loss options

## 2020-05-22 ENCOUNTER — Other Ambulatory Visit (HOSPITAL_COMMUNITY): Payer: Self-pay | Admitting: Cardiology

## 2020-05-25 ENCOUNTER — Ambulatory Visit: Payer: BC Managed Care – PPO | Attending: Internal Medicine

## 2020-05-25 DIAGNOSIS — Z23 Encounter for immunization: Secondary | ICD-10-CM

## 2020-05-25 NOTE — Progress Notes (Signed)
   Covid-19 Vaccination Clinic  Name:  Lance [name]    MRN: [id number] DOB: [birth year]  05/25/2020  [name] was observed post Covid-19 immunization for 15 minutes without incident. He was provided with Vaccine Information Sheet and instruction to access the V-Safe system.   [name] was instructed to call 911 with any severe reactions post vaccine: Marland Kitchen Difficulty breathing  . Swelling of face and throat  . A fast heartbeat  . A bad rash all over body  . Dizziness and weakness   Immunizations Administered    Name Date Dose VIS Date Route   Pfizer COVID-19 Vaccine 05/25/2020 10:58 AM 0.3 mL 03/27/2020 Intramuscular   Manufacturer: [employer], [employer]   Lot: [id number]   NDC: [address]-4

## 2020-06-12 ENCOUNTER — Other Ambulatory Visit: Payer: BC Managed Care – PPO

## 2020-06-12 DIAGNOSIS — Z20822 Contact with and (suspected) exposure to covid-19: Secondary | ICD-10-CM

## 2020-06-13 LAB — SARS-COV-2, NAA 2 DAY TAT

## 2020-06-13 LAB — NOVEL CORONAVIRUS, NAA: SARS-CoV-2, NAA: NOT DETECTED

## 2020-06-25 ENCOUNTER — Other Ambulatory Visit: Payer: Self-pay | Admitting: Internal Medicine

## 2020-07-30 ENCOUNTER — Other Ambulatory Visit: Payer: Self-pay | Admitting: Internal Medicine

## 2020-07-31 NOTE — Telephone Encounter (Signed)
Xarelto 20mg  refill request received. Pt is 40 years old, weight- 145kg, Crea-1.04 on 02/08/2020, last seen by Dr. 04/09/2020 on 03/07/2020, Diagnosis-Afib, CrCl-191.67ml/min; Dose is appropriate based on dosing criteria. Will send in refill to requested pharmacy.

## 2020-09-05 ENCOUNTER — Other Ambulatory Visit (HOSPITAL_COMMUNITY): Payer: Self-pay | Admitting: Cardiology

## 2020-12-13 ENCOUNTER — Other Ambulatory Visit (HOSPITAL_COMMUNITY): Payer: Self-pay | Admitting: Cardiology

## 2021-02-11 ENCOUNTER — Other Ambulatory Visit (HOSPITAL_COMMUNITY): Payer: Self-pay | Admitting: Internal Medicine

## 2021-02-11 ENCOUNTER — Other Ambulatory Visit (HOSPITAL_COMMUNITY): Payer: Self-pay | Admitting: Cardiology

## 2021-02-11 DIAGNOSIS — I5022 Chronic systolic (congestive) heart failure: Secondary | ICD-10-CM

## 2021-03-24 ENCOUNTER — Other Ambulatory Visit (HOSPITAL_COMMUNITY): Payer: Self-pay | Admitting: Internal Medicine

## 2021-03-31 ENCOUNTER — Encounter: Payer: Self-pay | Admitting: Internal Medicine

## 2021-03-31 ENCOUNTER — Ambulatory Visit (INDEPENDENT_AMBULATORY_CARE_PROVIDER_SITE_OTHER): Payer: BC Managed Care – PPO | Admitting: Internal Medicine

## 2021-03-31 ENCOUNTER — Other Ambulatory Visit: Payer: Self-pay

## 2021-03-31 ENCOUNTER — Ambulatory Visit: Payer: BC Managed Care – PPO | Admitting: Internal Medicine

## 2021-03-31 ENCOUNTER — Encounter: Payer: BC Managed Care – PPO | Admitting: Internal Medicine

## 2021-03-31 VITALS — BP 120/72 | HR 71 | Temp 98.1°F | Ht 74.0 in | Wt 316.0 lb

## 2021-03-31 VITALS — BP 130/80 | HR 88 | Ht 74.0 in | Wt 316.0 lb

## 2021-03-31 DIAGNOSIS — I4819 Other persistent atrial fibrillation: Secondary | ICD-10-CM

## 2021-03-31 DIAGNOSIS — I4892 Unspecified atrial flutter: Secondary | ICD-10-CM | POA: Diagnosis not present

## 2021-03-31 DIAGNOSIS — F419 Anxiety disorder, unspecified: Secondary | ICD-10-CM

## 2021-03-31 DIAGNOSIS — G4733 Obstructive sleep apnea (adult) (pediatric): Secondary | ICD-10-CM | POA: Diagnosis not present

## 2021-03-31 DIAGNOSIS — I428 Other cardiomyopathies: Secondary | ICD-10-CM

## 2021-03-31 DIAGNOSIS — I5022 Chronic systolic (congestive) heart failure: Secondary | ICD-10-CM | POA: Diagnosis not present

## 2021-03-31 DIAGNOSIS — Z0000 Encounter for general adult medical examination without abnormal findings: Secondary | ICD-10-CM

## 2021-03-31 DIAGNOSIS — Z23 Encounter for immunization: Secondary | ICD-10-CM | POA: Diagnosis not present

## 2021-03-31 DIAGNOSIS — I10 Essential (primary) hypertension: Secondary | ICD-10-CM

## 2021-03-31 MED ORDER — LOSARTAN POTASSIUM 25 MG PO TABS: 25.0000 mg | ORAL_TABLET | Freq: Every day | ORAL | 3 refills | Status: DC

## 2021-03-31 MED ORDER — B COMPLEX PLUS PO TABS: 1.0000 | ORAL_TABLET | Freq: Every day | ORAL | 3 refills | Status: AC

## 2021-03-31 MED ORDER — FLECAINIDE ACETATE 50 MG PO TABS
50.0000 mg | ORAL_TABLET | Freq: Two times a day (BID) | ORAL | 3 refills | Status: DC
Start: 2021-03-31 — End: 2021-09-15

## 2021-03-31 MED ORDER — METOPROLOL SUCCINATE ER 25 MG PO TB24: 25.0000 mg | ORAL_TABLET | Freq: Every day | ORAL | 3 refills | Status: DC

## 2021-03-31 MED ORDER — RIVAROXABAN 20 MG PO TABS: 20.0000 mg | ORAL_TABLET | Freq: Every day | ORAL | 3 refills | Status: DC

## 2021-03-31 MED ORDER — BUSPIRONE HCL 10 MG PO TABS: 5.0000 mg | ORAL_TABLET | Freq: Two times a day (BID) | ORAL | 1 refills | Status: DC

## 2021-03-31 MED ORDER — VITAMIN D3 50 MCG (2000 UT) PO CAPS: 2000.0000 [IU] | ORAL_CAPSULE | Freq: Every day | ORAL | 3 refills | Status: DC

## 2021-03-31 NOTE — Progress Notes (Signed)
Subjective:  Patient ID: Lance [name], male    DOB: [birth year]  Age: 40 y.o. MRN: [id number]  CC: Annual Exam   HPI Lance [name] presents for a well exam     ROS: Review of Systems  Constitutional:  Negative for appetite change, fatigue and unexpected weight change.  HENT:  Negative for congestion, nosebleeds, sneezing, sore throat and trouble swallowing.   Eyes:  Negative for itching and visual disturbance.  Respiratory:  Negative for cough.   Cardiovascular:  Negative for chest pain, palpitations and leg swelling.  Gastrointestinal:  Negative for abdominal distention, blood in stool, diarrhea and nausea.  Genitourinary:  Negative for frequency and hematuria.  Musculoskeletal:  Negative for back pain, gait problem, joint swelling and neck pain.  Skin:  Negative for rash.  Neurological:  Negative for dizziness, tremors, speech difficulty and weakness.  Psychiatric/Behavioral:  Negative for agitation, dysphoric mood and sleep disturbance. The patient is not nervous/anxious.    Objective:  BP 120/72 (BP Location: Left Arm)   Pulse 71   Temp 98.1 F (36.7 C) (Oral)   Ht 6\' 2"  (1.88 m)   Wt (!) 316 lb (143.3 kg)   SpO2 97%   BMI 40.57 kg/m   BP Readings from Last 3 Encounters:  03/31/21 130/80  03/31/21 120/72  03/27/20 118/70    Wt Readings from Last 3 Encounters:  03/31/21 (!) 316 lb (143.3 kg)  03/31/21 (!) 316 lb (143.3 kg)  03/27/20 (!) 319 lb 9.6 oz (145 kg)    Physical Exam Constitutional:      General: He is not in acute distress.    Appearance: He is well-developed. He is obese.     Comments: NAD  Eyes:     Conjunctiva/sclera: Conjunctivae normal.     Pupils: Pupils are equal, round, and reactive to light.  Neck:     Thyroid: No thyromegaly.     Vascular: No JVD.  Cardiovascular:     Rate and Rhythm: Normal rate. Rhythm irregular.     Heart sounds: Normal heart sounds. No murmur heard.   No friction rub. No gallop.  Pulmonary:     Effort:  Pulmonary effort is normal. No respiratory distress.     Breath sounds: Normal breath sounds. No wheezing or rales.  Chest:     Chest wall: No tenderness.  Abdominal:     General: Bowel sounds are normal. There is no distension.     Palpations: Abdomen is soft. There is no mass.     Tenderness: There is no abdominal tenderness. There is no guarding or rebound.  Musculoskeletal:        General: No tenderness. Normal range of motion.     Cervical back: Normal range of motion.  Lymphadenopathy:     Cervical: No cervical adenopathy.  Skin:    General: Skin is warm and dry.     Findings: No rash.  Neurological:     Mental Status: He is alert and oriented to person, place, and time.     Cranial Nerves: No cranial nerve deficit.     Motor: No abnormal muscle tone.     Coordination: Coordination normal.     Gait: Gait normal.     Deep Tendon Reflexes: Reflexes are normal and symmetric.  Psychiatric:        Behavior: Behavior normal.        Thought Content: Thought content normal.        Judgment: Judgment normal.    Lab  Results  Component Value Date   WBC 6.5 04/04/2021   HGB 14.4 04/04/2021   HCT 43.3 04/04/2021   PLT 252.0 04/04/2021   GLUCOSE 84 04/04/2021   CHOL 126 04/04/2021   TRIG 73.0 04/04/2021   HDL 29.80 (L) 04/04/2021   LDLCALC 81 04/04/2021   ALT 22 04/04/2021   AST 21 04/04/2021   NA 141 04/04/2021   K 4.3 04/04/2021   CL 105 04/04/2021   CREATININE 1.03 04/04/2021   BUN 11 04/04/2021   CO2 30 04/04/2021   TSH 2.60 04/04/2021   PSA 0.67 04/04/2021   INR 1.07 01/01/2016    No results found.  Assessment & Plan:   Problem List Items Addressed This Visit     Anxiety    Cont Buspar low dose      Relevant Medications   busPIRone (BUSPAR) 10 MG tablet   Morbid obesity due to excess calories (HCC)    Discussed BMI 40.5      Nonischemic cardiomyopathy (HCC)    F/u w/Dr Bensimon today On Xarelto, Losartan, Toprol XL      Well adult exam - Primary      We discussed age appropriate health related issues, including available/recomended screening tests and vaccinations. Labs were ordered to be later reviewed . All questions were answered. We discussed one or more of the following - seat belt use, use of sunscreen/sun exposure exercise, fall risk reduction, second hand smoke exposure, firearm use and storage, seat belt use, a need for adhering to healthy diet and exercise. Labs were ordered.  All questions were answered. Loose wt Take Vit B12 and Vit D        Relevant Orders   TSH (Completed)   Urinalysis (Completed)   CBC with Differential/Platelet (Completed)   Lipid panel (Completed)   Comprehensive metabolic panel (Completed)   PSA (Completed)   Other Visit Diagnoses     Needs flu shot       Need for diphtheria-tetanus-pertussis (Tdap) vaccine             Meds ordered this encounter  Medications   B Complex-Folic Acid (B COMPLEX PLUS) TABS    Sig: Take 1 tablet by mouth daily.    Dispense:  100 tablet    Refill:  3   Cholecalciferol (VITAMIN D3) 50 MCG (2000 UT) capsule    Sig: Take 1 capsule (2,000 Units total) by mouth daily.    Dispense:  100 capsule    Refill:  3   busPIRone (BUSPAR) 10 MG tablet    Sig: Take 0.5-1 tablets (5-10 mg total) by mouth 2 (two) times daily.    Dispense:  180 tablet    Refill:  1      Follow-up: Return in about 1 year (around 03/31/2022) for Wellness Exam.  Sonda Primes, MD

## 2021-03-31 NOTE — Assessment & Plan Note (Addendum)
F/u w/Dr Milas Kocher today On Xarelto, Losartan, Toprol XL

## 2021-03-31 NOTE — Assessment & Plan Note (Signed)
Cont Buspar low dose

## 2021-03-31 NOTE — Patient Instructions (Addendum)
Medication Instructions:  Start Flecainide 50 mg two times a day Your physician recommends that you continue on your current medications as directed. Please refer to the Current Medication list given to you today. *If you need a refill on your cardiac medications before your next appointment, please call your pharmacy*  Lab Work: None. If you have labs (blood work) drawn today and your tests are completely normal, you will receive your results only by: MyChart Message (if you have MyChart) OR A paper copy in the mail If you have any lab test that is abnormal or we need to change your treatment, we will call you to review the results.  Testing/Procedures: None.  Follow-Up: At [facility], you and your health needs are our priority.  As part of our continuing mission to provide you with exceptional heart care, we have created designated Provider Care Teams.  These Care Teams include your primary Cardiologist (physician) and Advanced Practice Providers (APPs -  Physician Assistants and Nurse Practitioners) who all work together to provide you with the care you need, when you need it.  Your physician wants you to follow-up in: 4 weeks in the Afib Clinic. They will contact you to schedule.   We recommend signing up for the patient portal called "MyChart".  Sign up information is provided on this After Visit Summary.  MyChart is used to connect with patients for Virtual Visits (Telemedicine).  Patients are able to view lab/test results, encounter notes, upcoming appointments, etc.  Non-urgent messages can be sent to your provider as well.   To learn more about what you can do with MyChart, go to [email or web address].    Any Other Special Instructions Will Be Listed Below (If Applicable).

## 2021-03-31 NOTE — Assessment & Plan Note (Addendum)
  We discussed age appropriate health related issues, including available/recomended screening tests and vaccinations. Labs were ordered to be later reviewed . All questions were answered. We discussed one or more of the following - seat belt use, use of sunscreen/sun exposure exercise, fall risk reduction, second hand smoke exposure, firearm use and storage, seat belt use, a need for adhering to healthy diet and exercise. Labs were ordered.  All questions were answered. Loose wt Take Vit B12 and Vit D

## 2021-03-31 NOTE — Progress Notes (Signed)
PCP: Tresa Garter, MD Primary Cardiologist: Dr [name] Primary EP: Dr Tito Dine Lance [name] is a 40 y.o. male who presents today for routine electrophysiology followup.  Since last being seen in our clinic, the patient reports doing reasonably well. He has not been to our clinic since 2021.  He says that he has short episodes of afib about twice per week..  these are worsened with work related stress. Today, he denies symptoms of  chest pain, shortness of breath,  lower extremity edema, dizziness, presyncope, or syncope.  The patient is otherwise without complaint today.   Past Medical History:  Diagnosis Date   Acute systolic heart failure (HCC) 09/14/2012   Anxiety 03/27/2020   Atrial flutter (HCC)    s/p ablation by Dr [name]   Medical history non-contributory    Neuromuscular disorder (HCC)    Onychomycosis    OSA on CPAP    Peripheral vascular disease (HCC)    Persistent atrial fibrillation (HCC) 2003   Past Surgical History:  Procedure Laterality Date   ATRIAL FIBRILLATION ABLATION N/A 02/15/2014   Procedure: ATRIAL FIBRILLATION ABLATION;  Surgeon: Gardiner Rhyme, MD;  Location: [facility] CATH LAB;  Service: Cardiovascular;  Laterality: N/A;   ATRIAL FIBRILLATION ABLATION N/A 03/30/2019   Procedure: ATRIAL FIBRILLATION ABLATION;  Surgeon: Hillis Range, MD;  Location: [facility] INVASIVE CV LAB;  Service: Cardiovascular;  Laterality: N/A;   ATRIAL FLUTTER ABLATION  09/14/2012   by Dr [name]   ATRIAL FLUTTER ABLATION N/A 09/14/2012   Procedure: ATRIAL FLUTTER ABLATION;  Surgeon: Marinus Maw, MD;  Location: [facility] CATH LAB;  Service: Cardiovascular;  Laterality: N/A;   CARDIAC CATHETERIZATION     CARDIOVERSION N/A 08/14/2012   Procedure: CARDIOVERSION;  Surgeon: Dolores Patty, MD;  Location: [facility] OR;  Service: Cardiovascular;  Laterality: N/A;   CARDIOVERSION N/A 01/03/2016   Procedure: CARDIOVERSION;  Surgeon: Pricilla Riffle, MD;  Location: [facility] ENDOSCOPY;  Service: Cardiovascular;   Laterality: N/A;   CARDIOVERSION N/A 03/08/2017   Procedure: CARDIOVERSION;  Surgeon: Dolores Patty, MD;  Location: [facility] ENDOSCOPY;  Service: Cardiovascular;  Laterality: N/A;   MANDIBLE SURGERY     underbite correction   TEE WITHOUT CARDIOVERSION N/A 02/14/2014   Procedure: TRANSESOPHAGEAL ECHOCARDIOGRAM (TEE);  Surgeon: Quintella Reichert, MD;  Location: [facility] ENDOSCOPY;  Service: Cardiovascular;  Laterality: N/A;   TEE WITHOUT CARDIOVERSION N/A 01/03/2016   Procedure: TRANSESOPHAGEAL ECHOCARDIOGRAM (TEE);  Surgeon: Pricilla Riffle, MD;  Location: [facility] ENDOSCOPY;  Service: Cardiovascular;  Laterality: N/A;    ROS- all systems are reviewed and negatives except as per HPI above  Medicines reviewed   Physical Exam: Vitals:   03/31/21 1613  BP: 130/80  Pulse: 88  SpO2: 99%  Weight: (!) 316 lb (143.3 kg)  Height: 6\' 2"  (1.88 m)    GEN- The patient is well appearing, alert and oriented x 3 today.   Head- normocephalic, atraumatic Eyes-  Sclera clear, conjunctiva pink Ears- hearing intact Oropharynx- clear Lungs- Clear to ausculation bilaterally, normal work of breathing Heart- Regular rate and rhythm, no murmurs, rubs or gallops, PMI not laterally displaced GI- soft, NT, ND, + BS Extremities- no clubbing, cyanosis, or edema  Wt Readings from Last 3 Encounters:  03/31/21 (!) 316 lb (143.3 kg)  03/31/21 (!) 316 lb (143.3 kg)  03/27/20 (!) 319 lb 9.6 oz (145 kg)   Echo 02/15/20- EF 60%  EKG tracing ordered today is personally reviewed and shows sinus, QRS 102 msec  Assessment and  Plan:  Persistent afib S/p AF ablation x 2 (most recently 03/30/19) Chads2vasc score is at least 2.  Continue xarelto Event monitor 02/2020 revealed AF burden of 2% at that time with frequent PACs I will start flecainide 50mg  BID.  This can be further titrated if needed. I have advised lifestyle modification also Echo is ordered however he declines echo at this time due to costs.  2. OSA Compliance with  BiPAP is advised  3. Obesity Body mass index is 40.57 kg/m. Lifestyle modification is again advised  4. 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   Follow-up in the AF clinic in 4 weeks to assess response to flecainide and further management  MD, [facility] 03/31/2021 4:25 PM

## 2021-03-31 NOTE — Assessment & Plan Note (Signed)
Discussed BMI 40.5

## 2021-04-04 ENCOUNTER — Other Ambulatory Visit (INDEPENDENT_AMBULATORY_CARE_PROVIDER_SITE_OTHER): Payer: BC Managed Care – PPO

## 2021-04-04 DIAGNOSIS — Z0000 Encounter for general adult medical examination without abnormal findings: Secondary | ICD-10-CM

## 2021-04-04 LAB — COMPREHENSIVE METABOLIC PANEL
ALT: 22 U/L (ref 0–53)
AST: 21 U/L (ref 0–37)
Albumin: 4.5 g/dL (ref 3.5–5.2)
Alkaline Phosphatase: 53 U/L (ref 39–117)
BUN: 11 mg/dL (ref 6–23)
CO2: 30 mEq/L (ref 19–32)
Calcium: 9.5 mg/dL (ref 8.4–10.5)
Chloride: 105 mEq/L (ref 96–112)
Creatinine, Ser: 1.03 mg/dL (ref 0.40–1.50)
GFR: 89.98 mL/min (ref >60.00)
Glucose, Bld: 84 mg/dL (ref 70–99)
Potassium: 4.3 mEq/L (ref 3.5–5.1)
Sodium: 141 mEq/L (ref 135–145)
Total Bilirubin: 0.6 mg/dL (ref 0.2–1.2)
Total Protein: 7.5 g/dL (ref 6.0–8.3)

## 2021-04-04 LAB — LIPID PANEL
Cholesterol: 126 mg/dL (ref 0–200)
HDL: 29.8 mg/dL — ABNORMAL LOW (ref >39.00)
LDL Cholesterol: 81 mg/dL (ref 0–99)
NonHDL: 96.01
Total CHOL/HDL Ratio: 4
Triglycerides: 73 mg/dL (ref 0.0–149.0)
VLDL: 14.6 mg/dL (ref 0.0–40.0)

## 2021-04-04 LAB — CBC WITH DIFFERENTIAL/PLATELET
Basophils Absolute: 0 10*3/uL (ref 0.0–0.1)
Basophils Relative: 0.5 % (ref 0.0–3.0)
Eosinophils Absolute: 0.2 10*3/uL (ref 0.0–0.7)
Eosinophils Relative: 3.3 % (ref 0.0–5.0)
HCT: 43.3 % (ref 39.0–52.0)
Hemoglobin: 14.4 g/dL (ref 13.0–17.0)
Lymphocytes Relative: 29.2 % (ref 12.0–46.0)
Lymphs Abs: 1.9 10*3/uL (ref 0.7–4.0)
MCHC: 33.4 g/dL (ref 30.0–36.0)
MCV: 89.5 fl (ref 78.0–100.0)
Monocytes Absolute: 0.7 10*3/uL (ref 0.1–1.0)
Monocytes Relative: 10.5 % (ref 3.0–12.0)
Neutro Abs: 3.7 10*3/uL (ref 1.4–7.7)
Neutrophils Relative %: 56.5 % (ref 43.0–77.0)
Platelets: 252 10*3/uL (ref 150.0–400.0)
RBC: 4.83 Mil/uL (ref 4.22–5.81)
RDW: 13.5 % (ref 11.5–15.5)
WBC: 6.5 10*3/uL (ref 4.0–10.5)

## 2021-04-04 LAB — PSA: PSA: 0.67 ng/mL (ref 0.10–4.00)

## 2021-04-04 LAB — URINALYSIS
Bilirubin Urine: NEGATIVE
Hgb urine dipstick: NEGATIVE
Ketones, ur: NEGATIVE
Leukocytes,Ua: NEGATIVE
Nitrite: NEGATIVE
Specific Gravity, Urine: >=1.03 — AB (ref 1.000–1.030)
Total Protein, Urine: NEGATIVE
Urine Glucose: NEGATIVE
Urobilinogen, UA: 0.2 (ref 0.0–1.0)
pH: 5.5 (ref 5.0–8.0)

## 2021-04-04 LAB — TSH: TSH: 2.6 u[IU]/mL (ref 0.35–5.50)

## 2021-04-21 ENCOUNTER — Other Ambulatory Visit (HOSPITAL_COMMUNITY): Payer: Self-pay | Admitting: Internal Medicine

## 2021-04-30 ENCOUNTER — Other Ambulatory Visit: Payer: Self-pay

## 2021-04-30 ENCOUNTER — Encounter (HOSPITAL_COMMUNITY): Payer: Self-pay | Admitting: Physician Assistant

## 2021-04-30 ENCOUNTER — Ambulatory Visit (HOSPITAL_COMMUNITY)
Admission: RE | Admit: 2021-04-30 | Discharge: 2021-04-30 | Disposition: A | Discharge status: Home / Self Care | Payer: BC Managed Care – PPO | Source: Ambulatory Visit | Attending: Physician Assistant | Admitting: Physician Assistant

## 2021-04-30 VITALS — BP 122/68 | HR 85 | Ht 74.0 in | Wt 320.0 lb

## 2021-04-30 DIAGNOSIS — I4892 Unspecified atrial flutter: Secondary | ICD-10-CM | POA: Insufficient documentation

## 2021-04-30 DIAGNOSIS — I493 Ventricular premature depolarization: Secondary | ICD-10-CM | POA: Diagnosis not present

## 2021-04-30 DIAGNOSIS — Z9989 Dependence on other enabling machines and devices: Secondary | ICD-10-CM | POA: Diagnosis not present

## 2021-04-30 DIAGNOSIS — E669 Obesity, unspecified: Secondary | ICD-10-CM | POA: Diagnosis not present

## 2021-04-30 DIAGNOSIS — Z6841 Body Mass Index (BMI) 40.0 and over, adult: Secondary | ICD-10-CM | POA: Diagnosis not present

## 2021-04-30 DIAGNOSIS — I428 Other cardiomyopathies: Secondary | ICD-10-CM | POA: Insufficient documentation

## 2021-04-30 DIAGNOSIS — Z91199 Patient's noncompliance with other medical treatment and regimen due to unspecified reason: Secondary | ICD-10-CM | POA: Insufficient documentation

## 2021-04-30 DIAGNOSIS — I739 Peripheral vascular disease, unspecified: Secondary | ICD-10-CM | POA: Insufficient documentation

## 2021-04-30 DIAGNOSIS — Z79899 Other long term (current) drug therapy: Secondary | ICD-10-CM | POA: Insufficient documentation

## 2021-04-30 DIAGNOSIS — Z7902 Long term (current) use of antithrombotics/antiplatelets: Secondary | ICD-10-CM | POA: Insufficient documentation

## 2021-04-30 DIAGNOSIS — R000 Tachycardia, unspecified: Secondary | ICD-10-CM | POA: Insufficient documentation

## 2021-04-30 DIAGNOSIS — I4819 Other persistent atrial fibrillation: Secondary | ICD-10-CM

## 2021-04-30 DIAGNOSIS — D6869 Other thrombophilia: Secondary | ICD-10-CM | POA: Diagnosis not present

## 2021-04-30 DIAGNOSIS — I110 Hypertensive heart disease with heart failure: Secondary | ICD-10-CM | POA: Insufficient documentation

## 2021-04-30 DIAGNOSIS — I5022 Chronic systolic (congestive) heart failure: Secondary | ICD-10-CM | POA: Insufficient documentation

## 2021-04-30 DIAGNOSIS — Z7182 Exercise counseling: Secondary | ICD-10-CM | POA: Diagnosis not present

## 2021-04-30 DIAGNOSIS — G4733 Obstructive sleep apnea (adult) (pediatric): Secondary | ICD-10-CM | POA: Insufficient documentation

## 2021-04-30 NOTE — Progress Notes (Signed)
Primary Care Physician: Tresa Garter, MD Primary Cardiologist: Dr [name] Primary Electrophysiologist: Dr Johney Frame Referring Physician: Dr Tito Dine MIKLE STERNBERG is a 40 y.o. male with a history of persistent atrial fibrillation, atrial flutter, OSA, tachycardia mediated CM, PVD who presents for follow up in the [facility]. Patient was admitted in 2014 with afib RVR and was loaded on amiodarone and became profoundly hypotensive requiring levophed and IVF.  He underwent TEE and failed DCCV on 3/9.  Dr. [name] placed him on Tikosyn and he had polymorphic VT.  Transitioned to oral amiodarone with good rate response.  Echo during admission showed EF 10% with diffuse hypokinesis.  He was diuresed and discharged on amiodarone, xarelto, digoxin, ramipril, lopressor and lasix.  S/P A-Flutter ablation 2014. EF subsequently returned to normal. Readmitted in July 2017 with HF and found to have recurrent AF with RVR. EF back down to 25-30%. Underwent attempt at [facility] on 01/03/16. Maintained NSR for 2 mintues and then back to AF. He was placed on IV amiodarone with close monitoring of BP along with oral amiodarone for faster loading. He eventually converted back to NSR on IV amiodarone. He continued to have episodes of symptomatic afib. He underwent repeat afib ablation on 03/30/19. He is on Xarelto for a CHADS2VASC score of 2.  On follow up today, patient was seen by Dr Johney Frame 03/31/21 and was having palpitations about twice per week. A heart monitor 02/2020 showed 2% afib burden. He was started on flecainide. Initially, he did not notice any change in his palpitations but over the last two weeks his palpitations have resolved. He admits he is intermittently compliant with his BiPap and most of his palpitations occurred on waking in the morning.   Today, he denies symptoms of palpitations, chest pain, shortness of breath, orthopnea, PND, lower extremity edema, dizziness,  presyncope, syncope, snoring, daytime somnolence, bleeding, or neurologic sequela. The patient is tolerating medications without difficulties and is otherwise without complaint today.    Atrial Fibrillation Risk Factors:  he does have symptoms or diagnosis of sleep apnea. he is compliant with BiPap therapy. he does not have a history of rheumatic fever. he does not have a history of alcohol use. The patient does not have a history of early familial atrial fibrillation or other arrhythmias.  he has a BMI of Body mass index is 41.09 kg/m.Marland Kitchen Filed Weights   04/30/21 1000  Weight: (!) 145.2 kg     Family History  Problem Relation Age of Onset   Diabetes Mother    Hypertension Mother    Varicose Veins Mother    Depression Father    Osteoarthritis Father    Alcohol abuse Father      Atrial Fibrillation Management history:  Previous antiarrhythmic drugs: flecainide, dofetilide, amiodarone Previous cardioversions: 2014, 2017, 2018 Previous ablations: flutter with Dr [name] 2014, afib ablations 2015, 03/30/19 CHADS2VASC score: 2 Anticoagulation history: Xarelto   Past Medical History:  Diagnosis Date   Acute systolic heart failure (HCC) 09/14/2012   Anxiety 03/27/2020   Atrial flutter (HCC)    s/p ablation by Dr [name]   Medical history non-contributory    Neuromuscular disorder (HCC)    Onychomycosis    OSA on CPAP    Peripheral vascular disease (HCC)    Persistent atrial fibrillation (HCC) 2003   Past Surgical History:  Procedure Laterality Date   ATRIAL FIBRILLATION ABLATION N/A 02/15/2014   Procedure: ATRIAL FIBRILLATION ABLATION;  Surgeon: Gardiner Rhyme,  MD;  Location: [facility] CATH LAB;  Service: Cardiovascular;  Laterality: N/A;   ATRIAL FIBRILLATION ABLATION N/A 03/30/2019   Procedure: ATRIAL FIBRILLATION ABLATION;  Surgeon: Hillis Range, MD;  Location: [facility] INVASIVE CV LAB;  Service: Cardiovascular;  Laterality: N/A;   ATRIAL FLUTTER ABLATION  09/14/2012   by Dr  [name]   ATRIAL FLUTTER ABLATION N/A 09/14/2012   Procedure: ATRIAL FLUTTER ABLATION;  Surgeon: Marinus Maw, MD;  Location: [facility] CATH LAB;  Service: Cardiovascular;  Laterality: N/A;   CARDIAC CATHETERIZATION     CARDIOVERSION N/A 08/14/2012   Procedure: CARDIOVERSION;  Surgeon: Dolores Patty, MD;  Location: [facility] OR;  Service: Cardiovascular;  Laterality: N/A;   CARDIOVERSION N/A 01/03/2016   Procedure: CARDIOVERSION;  Surgeon: Pricilla Riffle, MD;  Location: [facility] ENDOSCOPY;  Service: Cardiovascular;  Laterality: N/A;   CARDIOVERSION N/A 03/08/2017   Procedure: CARDIOVERSION;  Surgeon: Dolores Patty, MD;  Location: [facility] ENDOSCOPY;  Service: Cardiovascular;  Laterality: N/A;   MANDIBLE SURGERY     underbite correction   TEE WITHOUT CARDIOVERSION N/A 02/14/2014   Procedure: TRANSESOPHAGEAL ECHOCARDIOGRAM (TEE);  Surgeon: Quintella Reichert, MD;  Location: [facility] ENDOSCOPY;  Service: Cardiovascular;  Laterality: N/A;   TEE WITHOUT CARDIOVERSION N/A 01/03/2016   Procedure: TRANSESOPHAGEAL ECHOCARDIOGRAM (TEE);  Surgeon: Pricilla Riffle, MD;  Location: [facility] ENDOSCOPY;  Service: Cardiovascular;  Laterality: N/A;    Current Outpatient Medications  Medication Sig Dispense Refill   acetaminophen (TYLENOL) 500 MG tablet Take 1,000 mg by mouth every 6 (six) hours as needed for headache.     B Complex-Folic Acid (B COMPLEX PLUS) TABS Take 1 tablet by mouth daily. 100 tablet 3   busPIRone (BUSPAR) 10 MG tablet Take 0.5-1 tablets (5-10 mg total) by mouth 2 (two) times daily. 180 tablet 1   Cholecalciferol (VITAMIN D3) 50 MCG (2000 UT) capsule Take 1 capsule (2,000 Units total) by mouth daily. 100 capsule 3   fexofenadine (ALLEGRA) 30 MG tablet Take 30 mg by mouth at bedtime as needed.      flecainide (TAMBOCOR) 50 MG tablet Take 1 tablet (50 mg total) by mouth 2 (two) times daily. 180 tablet 3   furosemide (LASIX) 40 MG tablet Take 1 tablet (40 mg total) by mouth daily. 90 tablet 3   losartan (COZAAR) 25 MG tablet Take 1  tablet (25 mg total) by mouth daily. Last refill without office visit please call [phone] to schedule 30 tablet 0   metoprolol succinate (TOPROL-XL) 25 MG 24 hr tablet Take 1 tablet (25 mg total) by mouth daily. 90 tablet 3   potassium chloride SA (KLOR-CON) 20 MEQ tablet Take 1 tablet (20 mEq total) by mouth 2 (two) times daily as needed. 180 tablet 3   rivaroxaban (XARELTO) 20 MG TABS tablet Take 1 tablet (20 mg total) by mouth daily with supper. Last refill without office visit please call [phone] to schedule 30 tablet 0   No current facility-administered medications for this encounter.    Allergies  Allergen Reactions   Amiodarone Other (See Comments)    IV intolerance with Severe hypotension, shock after amiodarone bolus.  Taking po Amiodarone without problems. Pt can tolerate IV drip but not bolus.    Altace [Ramipril]     Cough    Azithromycin Itching   Protonix [Pantoprazole Sodium]     Couldn't belch   Fish Allergy Other (See Comments)    Not shell fish - regular fish causes tingling   Metoprolol Succinate Other (See Comments)  Body aches at high doses.  Currently takes two tablets of lopressor 25mg  twice daily; three tablets twice daily caused him to ache.    Social History   Socioeconomic History   Marital status: Single    Spouse name: Not on file   Number of children: Not on file   Years of education: Not on file   Highest education level: Not on file  Occupational History   Occupation: [employer]    Employer: [employer]  Tobacco Use   Smoking status: Some Days    Years: 3.00    Types: Cigarettes, Cigars    Last attempt to quit: 05/12/2009    Years since quitting: 11.9   Smokeless tobacco: Never   Tobacco comments:    Former cigarette smoker smokes cigars once a month 04/30/21  Substance and Sexual Activity   Alcohol use: Not Currently    Comment: Glass of wine on weekends   Drug use: Not Currently    Types: Marijuana    Comment:  occassional use    Sexual activity: Yes  Other Topics Concern   Not on file  Social History Narrative   Not on file   Social Determinants of Health   Financial Resource Strain: Not on file  Food Insecurity: Not on file  Transportation Needs: Not on file  Physical Activity: Not on file  Stress: Not on file  Social Connections: Not on file  Intimate Partner Violence: Not on file     ROS- All systems are reviewed and negative except as per the HPI above.  Physical Exam: Vitals:   04/30/21 1000  BP: 122/68  Pulse: 85  Weight: (!) 145.2 kg  Height: 6\' 2"  (1.88 m)    GEN- The patient is a well appearing obese male, alert and oriented x 3 today.   HEENT-head normocephalic, atraumatic, sclera clear, conjunctiva pink, hearing intact, trachea midline. Lungs- Clear to ausculation bilaterally, normal work of breathing Heart- Regular rate and rhythm, occasional ectopic beat, no murmurs, rubs or gallops  GI- soft, NT, ND, + BS Extremities- no clubbing, cyanosis, or edema MS- no significant deformity or atrophy Skin- no rash or lesion Psych- euthymic mood, full affect Neuro- strength and sensation are intact   Wt Readings from Last 3 Encounters:  04/30/21 (!) 145.2 kg  03/31/21 (!) 143.3 kg  03/31/21 (!) 143.3 kg    EKG today demonstrates  SR, PACs, PVC Vent. rate 85 BPM PR interval 146 ms QRS duration 106 ms QT/QTcB 366/435 ms  Echo 02/02/19 demonstrated  1. The left ventricle has normal systolic function, with an ejection fraction of 55-60%. The cavity size was normal. Left ventricular diastolic parameters were normal.  2. The right ventricle has normal systolic function. The cavity was normal. There is no increase in right ventricular wall thickness. Right ventricular systolic pressure could not be assessed.  3. The aorta is abnormal unless otherwise noted.  4. There is mild dilatation of the aortic root measuring 39 mm.  5. The interatrial septum appears to be  lipomatous.  Epic records are reviewed at length today  Assessment and Plan:  1. Persistent atrial fibrillation/atrial flutter S/p flutter ablation 2014 and afib ablation 2015. S/p repeat afib ablation 03/30/19 with Dr Rayann Heman. Patient in SR, palpitations improved on flecainide.  Continue flecainide 50 mg BID Continue Toprol 25 mg daily Continue Xarelto 20 mg daily   This patients CHA2DS2-VASc Score and unadjusted Ischemic Stroke Rate (% per year) is equal to 2.2 % stroke rate/year  from a score of 2  Above score calculated as 1 point each if present [CHF, HTN, DM, Vascular=MI/PAD/Aortic Plaque, Age if 65-74, or Male] Above score calculated as 2 points each if present [Age > 75, or Stroke/TIA/TE]  2. Obesity Body mass index is 41.09 kg/m. Lifestyle modification was discussed and encouraged including regular physical activity and weight reduction.  3. Obstructive sleep apnea The importance of adequate treatment of sleep apnea was discussed today in order to improve our ability to maintain sinus rhythm long term. He admits to only using BiPap intermittently. Encouraged compliance with BiPap therapy.  4. Chronic systolic CHF Likely TCM EF normalized with SR. No signs or symptoms of fluid overload.  5. HTN Stable, no changes today.   Follow up in the AF clinic in 4-5 months.    [facility] [address] [place], [place] [address] [phone] 04/30/2021 10:16 AM

## 2021-06-27 ENCOUNTER — Telehealth: Payer: Self-pay

## 2021-06-27 NOTE — Telephone Encounter (Signed)
Pt is requesting a work from home letter from his PCP to be sent to him via MyChart as his job has stated he can do so as long as he has this letter.  Pt states he is needing to work from home due to the up rise in COVID again and he is high risk cause of his Heart conditions and other medical reasons.

## 2021-06-28 NOTE — Telephone Encounter (Signed)
Okay.  Please type she him a letter.  Thanks

## 2021-06-30 NOTE — Telephone Encounter (Signed)
Lance [name] has been sent to pt via MyChart.

## 2021-07-02 ENCOUNTER — Telehealth: Payer: Self-pay | Admitting: Internal Medicine

## 2021-07-02 NOTE — Telephone Encounter (Signed)
..  Type of form received:  LOA form  Additional comments:  Patient would like a copy of completed form sent to him through my chart  Received by:  Gus Height should be Faxed/mailed to: (address/ fax #)  [phone]  Is patient requesting call for pickup: no  Form placed:  Up front in Dr. Loren Racer bin  Attach charge sheet.  Provider will determine charge.  Individual made aware of 3-5 business day turn around No?

## 2021-07-02 NOTE — Telephone Encounter (Signed)
Place in purple folder for MD to complete.Marland KitchenChryl Heck

## 2021-07-04 NOTE — Telephone Encounter (Signed)
Patient called in after receiving vm from nurse  Patient says the letter is for him to continue to work from home bc employer is requesting all employees return back to office & patient would like to stay at home due to medical condition

## 2021-07-04 NOTE — Telephone Encounter (Signed)
MD wanted to know form that was left was for pt working from home? I called pt there was no answer LMOM RTC.Marland KitchenRaechel Chute

## 2021-07-07 DIAGNOSIS — Z0279 Encounter for issue of other medical certificate: Secondary | ICD-10-CM

## 2021-07-07 NOTE — Telephone Encounter (Signed)
Done. Thx.

## 2021-07-08 NOTE — Telephone Encounter (Signed)
Called pt there was no answer [facility] faxed forms & original is ready for p/u...Johny Chess

## 2021-07-08 NOTE — Telephone Encounter (Signed)
Pt called back pt was advise that forms had been faxed and he will come by in the morning 2/1 at 8am.  [facility]

## 2021-07-09 DIAGNOSIS — Z20822 Contact with and (suspected) exposure to covid-19: Secondary | ICD-10-CM | POA: Diagnosis not present

## 2021-07-10 ENCOUNTER — Telehealth: Payer: Self-pay | Admitting: Internal Medicine

## 2021-07-10 ENCOUNTER — Telehealth (INDEPENDENT_AMBULATORY_CARE_PROVIDER_SITE_OTHER): Payer: BC Managed Care – PPO | Admitting: Family Medicine

## 2021-07-10 ENCOUNTER — Encounter: Payer: Self-pay | Admitting: Family Medicine

## 2021-07-10 VITALS — Temp 100.9°F

## 2021-07-10 DIAGNOSIS — U071 COVID-19: Secondary | ICD-10-CM | POA: Diagnosis not present

## 2021-07-10 MED ORDER — BENZONATATE 100 MG PO CAPS: ORAL_CAPSULE | ORAL | 0 refills | Status: DC

## 2021-07-10 MED ORDER — MOLNUPIRAVIR EUA 200MG CAPSULE: 4.0000 | ORAL_CAPSULE | Freq: Two times a day (BID) | ORAL | 0 refills | Status: AC

## 2021-07-10 MED ORDER — MOLNUPIRAVIR EUA 200MG CAPSULE: 4.0000 | ORAL_CAPSULE | Freq: Two times a day (BID) | ORAL | 0 refills | Status: DC

## 2021-07-10 NOTE — Telephone Encounter (Signed)
Pt would like medication  sent to  [facility] [address] [place], [place] - [address] Phone:  [phone]  Fax:  [phone]

## 2021-07-10 NOTE — Telephone Encounter (Signed)
Lance [name] tech with  [employer] is calling and they  do not stock this medication molnupiravir EUA (LAGEVRIO) 200 mg CAPS capsule . [name] will contact the pt and let him know to call the  office to see where he would like rx sent to. Pt had virtual with dr [name] today

## 2021-07-10 NOTE — Patient Instructions (Signed)
HOME CARE TIPS:   -I sent the medication(s) we discussed to your pharmacy: Meds ordered this encounter  Medications   molnupiravir EUA (LAGEVRIO) 200 mg CAPS capsule    Sig: Take 4 capsules (800 mg total) by mouth 2 (two) times daily for 5 days.    Dispense:  40 capsule    Refill:  0   benzonatate (TESSALON PERLES) 100 MG capsule    Sig: 1-2 capsules up to twice daily as needed for cough    Dispense:  30 capsule    Refill:  0     -I sent in the [place] treatment or referral you requested per our discussion. Please see the information provided below and discuss further with the pharmacist/treatment team.   -there is a chance of rebound illness after finishing your treatment. If you become sick again please isolate for an additional 5 days, plus 5 more days of masking.   -can use tylenol if needed for fevers, aches and pains per instructions  -can use nasal saline a few times per day if you have nasal congestion  -stay hydrated, drink plenty of fluids and eat small healthy meals - avoid dairy  -follow up with your doctor in 2-3 days unless improving and feeling better  -stay home while sick, except to seek medical care. If you have COVID19, you will likely be contagious for 7-10 days. Flu or Influenza is likely contagious for about 7 days. Other respiratory viral infections remain contagious for 5-10+ days depending on the virus and many other factors. Wear a good mask that fits snugly (such as N95 or KN95) if around others to reduce the risk of transmission.  It was nice to meet you today, and I really hope you are feeling better soon. I help [place] out with telemedicine visits on Tuesdays and Thursdays and am happy to help if you need a follow up virtual visit on those days. Otherwise, if you have any concerns or questions following this visit please schedule a follow up visit with your Primary Care doctor or seek care at a local urgent care clinic to avoid delays in care.     Seek in person care or schedule a follow up video visit promptly if your symptoms worsen, new concerns arise or you are not improving with treatment. Call 911 and/or seek emergency care if your symptoms are severe or life threatening.    Fact Sheet for Patients And Caregivers Emergency Use Authorization (EUA) Of LAGEVRIO (molnupiravir) capsules For Coronavirus Disease 2019 (COVID-19)  What is the most important information I should know about LAGEVRIO? LAGEVRIO may cause serious side effects, including: ? LAGEVRIO may cause harm to your unborn baby. It is not known if LAGEVRIO will harm your baby if you take LAGEVRIO during pregnancy. o LAGEVRIO is not recommended for use in pregnancy. o LAGEVRIO has not been studied in pregnancy. LAGEVRIO was studied in pregnant animals only. When LAGEVRIO was given to pregnant animals, LAGEVRIO caused harm to their unborn babies. o You and your healthcare provider may decide that you should take LAGEVRIO during pregnancy if there are no other COVID-19 treatment options approved or authorized by the FDA that are accessible or clinically appropriate for you. o If you and your healthcare provider decide that you should take LAGEVRIO during pregnancy, you and your healthcare provider should discuss the known and potential benefits and the potential risks of taking LAGEVRIO during pregnancy. For individuals who are able to become pregnant: ? You should use a reliable  method of birth control (contraception) consistently and correctly during treatment with LAGEVRIO and for 4 days after the last dose of LAGEVRIO. Talk to your healthcare provider about reliable birth control methods. ? Before starting treatment with [facility] your healthcare provider may do a pregnancy test to see if you are pregnant before starting treatment with LAGEVRIO. ? Tell your healthcare provider right away if you become pregnant or think you may be pregnant during treatment with  LAGEVRIO. Pregnancy Surveillance Program: ? There is a pregnancy surveillance program for individuals who take LAGEVRIO during pregnancy. The purpose of this program is to collect information about the health of you and your baby. Talk to your healthcare provider about how to take part in this program. ? If you take LAGEVRIO during pregnancy and you agree to participate in the pregnancy surveillance program and allow your healthcare provider to share your information with [place], then your healthcare provider will report your use of [place] during pregnancy to [place]. by calling [phone] or [email or web address]. For individuals who are sexually active with partners who are able to become pregnant: ? It is not known if LAGEVRIO can affect sperm. While the risk is regarded as low, animal studies to fully assess the potential for LAGEVRIO to affect the babies of males treated with LAGEVRIO have not been completed. A reliable method of birth control (contraception) should be used consistently and correctly during treatment with LAGEVRIO and for at least 3 months after the last dose. The risk to sperm beyond 3 months is not known. Studies to understand the risk to sperm beyond 3 months are ongoing. Talk to your healthcare provider about reliable birth control methods. Talk to your healthcare provider if you have questions or concerns about how LAGEVRIO may affect sperm. You are being given this fact sheet because your healthcare provider believes it is necessary to provide you with LAGEVRIO for the treatment of adults with mild-to-moderate coronavirus disease 2019 (COVID-19) with positive results of direct SARS-CoV-2 viral testing, and who are at high risk for progression to severe COVID-19 including hospitalization or death, and for whom other COVID-19 treatment options approved or authorized by the FDA are not accessible or clinically appropriate. The  U.S. Food and Drug Administration (FDA) has issued an Emergency Use Authorization (EUA) to make LAGEVRIO available during the COVID-19 pandemic (for more details about an EUA please see What is an Emergency Use Authorization? at the end of this document). LAGEVRIO is not an FDA-approved medicine in the [place]. Read this Fact Sheet for information about LAGEVRIO. Talk to your healthcare provider about your options if you have any questions. It is your choice to take LAGEVRIO.  What is COVID-19? COVID-19 is caused by a virus called a coronavirus. You can get COVID-19 through close contact with another person who has the virus. COVID-19 illnesses have ranged from very mild-to-severe, including illness resulting in death. While information so far suggests that most COVID-19 illness is mild, serious illness can happen and may cause some of your other medical conditions to become worse. Older people and people of all ages with severe, long lasting (chronic) medical conditions like heart disease, lung disease and diabetes, for example seem to be at higher risk of being hospitalized for COVID-19.  What is LAGEVRIO? LAGEVRIO is an investigational medicine used to treat mild-to-moderate COVID-19 in adults: ? with positive results of direct SARS-CoV-2 viral testing, and ? who are at high risk for progression to severe COVID-19  including hospitalization or death, and for whom other COVID-19 treatment options approved or authorized by the FDA are not accessible or clinically appropriate. The FDA has authorized the emergency use of LAGEVRIO for the treatment of mild-tomoderate COVID-19 in adults under an EUA. For more information on EUA, see the What is an Emergency Use Authorization (EUA)? section at the end of this Fact Sheet. LAGEVRIO is not authorized: ? for use in people less than 3 years of age. ? for prevention of COVID-19. ? for people needing hospitalization for COVID-19. ? for  use for longer than 5 consecutive days.  What should I tell my healthcare provider before I take LAGEVRIO? Tell your healthcare provider if you: ? Have any allergies ? Are breastfeeding or plan to breastfeed ? Have any serious illnesses ? Are taking any medicines (prescription, over-the-counter, vitamins, or herbal products).  How do I take LAGEVRIO? ? Take LAGEVRIO exactly as your healthcare provider tells you to take it. ? Take 4 capsules of LAGEVRIO every 12 hours (for example, at 8 am and at 8 pm) ? Take LAGEVRIO for 5 days. It is important that you complete the full 5 days of treatment with LAGEVRIO. Do not stop taking LAGEVRIO before you complete the full 5 days of treatment, even if you feel better. ? Take LAGEVRIO with or without food. ? You should stay in isolation for as long as your healthcare provider tells you to. Talk to your healthcare provider if you are not sure about how to properly isolate while you have COVID-19. ? Swallow LAGEVRIO capsules whole. Do not open, break, or crush the capsules. If you cannot swallow capsules whole, tell your healthcare provider. ? What to do if you miss a dose: o If it has been less than 10 hours since the missed dose, take it as soon as you remember o If it has been more than 10 hours since the missed dose, skip the missed dose and take your dose at the next scheduled time. ? Do not double the dose of LAGEVRIO to make up for a missed dose.  What are the important possible side effects of LAGEVRIO? ? See, What is the most important information I should know about LAGEVRIO? ? Allergic Reactions. Allergic reactions can happen in people taking LAGEVRIO, even after only 1 dose. Stop taking LAGEVRIO and call your healthcare provider right away if you get any of the following symptoms of an allergic reaction: o hives o rapid heartbeat o trouble swallowing or breathing o swelling of the mouth, lips, or face o throat tightness o  hoarseness o skin rash The most common side effects of LAGEVRIO are: ? diarrhea ? nausea ? dizziness These are not all the possible side effects of LAGEVRIO. Not many people have taken LAGEVRIO. Serious and unexpected side effects may happen. This medicine is still being studied, so it is possible that all of the risks are not known at this time.  What other treatment choices are there?  Veklury (remdesivir) is FDA-approved as an intravenous (IV) infusion for the treatment of mildto-moderate XBDZH-29 in certain adults and children. Talk with your doctor to see if Marijean Heath is appropriate for you. Like LAGEVRIO, FDA may also allow for the emergency use of other medicines to treat people with COVID-19. Go to LacrosseProperties.si for more information. It is your choice to be treated or not to be treated with LAGEVRIO. Should you decide not to take it, it will not change your standard medical care.  What if  I am breastfeeding? Breastfeeding is not recommended during treatment with LAGEVRIO and for 4 days after the last dose of LAGEVRIO. If you are breastfeeding or plan to breastfeed, talk to your healthcare provider about your options and specific situation before taking LAGEVRIO.  How do I report side effects with LAGEVRIO? Contact your healthcare provider if you have any side effects that bother you or do not go away. Report side effects to FDA MedWatch at SmoothHits.hu or call 1-800-FDA-1088 (1- 403-156-7606).  How should I store [place]? ? Store LAGEVRIO capsules at room temperature between 63F to 88F (20C to 25C). ? Keep LAGEVRIO and all medicines out of the reach of children and pets. How can I learn more about COVID-19? ? Ask your healthcare provider. ? Visit SeekRooms.co.uk ? Contact your local or state public health department. ? Call [place] at  [phone] (toll free in the [place] ? Visit www.molnupiravir.com  What Is an Emergency Use Authorization (EUA)? The [place] FDA has made [place] available under an emergency access mechanism called an Emergency Use Authorization (EUA) The EUA is supported by a Presenter, broadcasting Health and Human Service (HHS) declaration that circumstances exist to justify emergency use of drugs and biological products during the COVID-19 pandemic. LAGEVRIO for the treatment of mild-to-moderate COVID-19 in adults with positive results of direct SARS-CoV-2 viral testing, who are at high risk for progression to severe COVID-19, including hospitalization or death, and for whom alternative COVID-19 treatment options approved or authorized by FDA are not accessible or clinically appropriate, has not undergone the same type of review as an FDA-approved product. In issuing an EUA under the XHBZJ-69 public health emergency, the FDA has determined, among other things, that based on the total amount of scientific evidence available including data from adequate and well-controlled clinical trials, if available, it is reasonable to believe that the product may be effective for diagnosing, treating, or preventing COVID-19, or a serious or life-threatening disease or condition caused by COVID-19; that the known and potential benefits of the product, when used to diagnose, treat, or prevent such disease or condition, outweigh the known and potential risks of such product; and that there are no adequate, approved, and available alternatives.  All of these criteria must be met to allow for the product to be used in the treatment of patients during the COVID-19 pandemic. The EUA for LAGEVRIO is in effect for the duration of the COVID-19 declaration justifying emergency use of LAGEVRIO, unless terminated or revoked (after which LAGEVRIO may no longer be used under the EUA). For patent information:  http://rogers.info/ Copyright  2021-2022 [place] [place], [place] and its affiliates. All rights reserved. [id number] Revised: March 2022

## 2021-07-10 NOTE — Progress Notes (Signed)
Virtual Visit via Video Note  I connected with Lance [name]  on 07/10/21 at  3:40 PM EST by a video enabled telemedicine application and verified that I am speaking with the correct person using two identifiers.  Location patient: [place] Location provider:work or home office Persons participating in the virtual visit: patient, provider  I discussed the limitations and requested verbal permission for telemedicine visit. The patient expressed understanding and agreed to proceed.   HPI:  Acute telemedicine visit for Covid19: -Onset: yesterday, positive covid test yesterday -Symptoms include: sore throat, body aches, nasal congestion, cough, low grade fever, chills at times, mild diarrhea -Denies:CP, SOB, nausea, vomiting -Pertinent past medical history: see below, has not had covid before -Pertinent medication allergies: Allergies  Allergen Reactions   Amiodarone Other (See Comments)    IV intolerance with Severe hypotension, shock after amiodarone bolus.  Taking po Amiodarone without problems. Pt can tolerate IV drip but not bolus.    Altace [Ramipril]     Cough    Azithromycin Itching   Protonix [Pantoprazole Sodium]     Couldn't belch   Fish Allergy Other (See Comments)    Not shell fish - regular fish causes tingling   Metoprolol Succinate Other (See Comments)    Body aches at high doses.  Currently takes two tablets of lopressor 25mg  twice daily; three tablets twice daily caused him to ache.  -COVID-19 vaccine status: 2 doses and 1 booster Immunization History  Administered Date(s) Administered   Influenza,inj,Quad PF,6+ Mos 02/06/2013, 02/05/2014, 02/08/2015, 03/10/2018, 03/16/2019, 03/27/2020, 03/31/2021   PFIZER(Purple Top)SARS-COV-2 Vaccination 09/03/2019, 09/24/2019, 05/25/2020   Pneumococcal Conjugate-13 02/17/2016   Pneumococcal Polysaccharide-23 02/17/2017   Tdap 11/14/2010, 03/31/2021     ROS: See pertinent positives and negatives per HPI.  Past Medical History:   Diagnosis Date   Acute systolic heart failure (HCC) 09/14/2012   Anxiety 03/27/2020   Atrial flutter (HCC)    s/p ablation by Dr 03/29/2020   Medical history non-contributory    Neuromuscular disorder (HCC)    Onychomycosis    OSA on CPAP    Peripheral vascular disease (HCC)    Persistent atrial fibrillation (HCC) 2003    Past Surgical History:  Procedure Laterality Date   ATRIAL FIBRILLATION ABLATION N/A 02/15/2014   Procedure: ATRIAL FIBRILLATION ABLATION;  Surgeon: 04/17/2014, MD;  Location: [facility] CATH LAB;  Service: Cardiovascular;  Laterality: N/A;   ATRIAL FIBRILLATION ABLATION N/A 03/30/2019   Procedure: ATRIAL FIBRILLATION ABLATION;  Surgeon: 04/01/2019, MD;  Location: [facility] INVASIVE CV LAB;  Service: Cardiovascular;  Laterality: N/A;   ATRIAL FLUTTER ABLATION  09/14/2012   by Dr 11/14/2012   ATRIAL FLUTTER ABLATION N/A 09/14/2012   Procedure: ATRIAL FLUTTER ABLATION;  Surgeon: 11/14/2012, MD;  Location[facility] CATH LAB;  Service: Cardiovascular;  Laterality: N/A;   CARDIAC CATHETERIZATION     CARDIOVERSION N/A 08/14/2012   Procedure: CARDIOVERSION;  Surgeon: 10/14/2012, MD;  Location[facility] OR;  Service: Cardiovascular;  Laterality: N/A;   CARDIOVERSION N/A 01/03/2016   Procedure: CARDIOVERSION;  Surgeon: 01/05/2016, MD;  Location: [facility] ENDOSCOPY;  Service: Cardiovascular;  Laterality: N/A;   CARDIOVERSION N/A 03/08/2017   Procedure: CARDIOVERSION;  Surgeon: 05/08/2017, MD;  Location: [facility] ENDOSCOPY;  Service: Cardiovascular;  Laterality: N/A;   MANDIBLE SURGERY     underbite correction   TEE WITHOUT CARDIOVERSION N/A 02/14/2014   Procedure: TRANSESOPHAGEAL ECHOCARDIOGRAM (TEE);  Surgeon: 04/16/2014, MD;  Location[facility] ENDOSCOPY;  Service: Cardiovascular;  Laterality: N/A;  TEE WITHOUT CARDIOVERSION N/A 01/03/2016   Procedure: TRANSESOPHAGEAL ECHOCARDIOGRAM (TEE);  Surgeon: Pricilla Riffle, MD;  Location: [facility] ENDOSCOPY;  Service: Cardiovascular;  Laterality: N/A;      Current Outpatient Medications:    acetaminophen (TYLENOL) 500 MG tablet, Take 1,000 mg by mouth every 6 (six) hours as needed for headache., Disp: , Rfl:    B Complex-Folic Acid (B COMPLEX PLUS) TABS, Take 1 tablet by mouth daily., Disp: 100 tablet, Rfl: 3   benzonatate (TESSALON PERLES) 100 MG capsule, 1-2 capsules up to twice daily as needed for cough, Disp: 30 capsule, Rfl: 0   busPIRone (BUSPAR) 10 MG tablet, Take 0.5-1 tablets (5-10 mg total) by mouth 2 (two) times daily. (Patient taking differently: Take 10 mg by mouth daily.), Disp: 180 tablet, Rfl: 1   Cholecalciferol (VITAMIN D3) 50 MCG (2000 UT) capsule, Take 1 capsule (2,000 Units total) by mouth daily., Disp: 100 capsule, Rfl: 3   fexofenadine (ALLEGRA) 30 MG tablet, Take 30 mg by mouth at bedtime as needed. , Disp: , Rfl:    flecainide (TAMBOCOR) 50 MG tablet, Take 1 tablet (50 mg total) by mouth 2 (two) times daily., Disp: 180 tablet, Rfl: 3   losartan (COZAAR) 25 MG tablet, Take 1 tablet (25 mg total) by mouth daily. Last refill without office visit please call [phone] to schedule, Disp: 30 tablet, Rfl: 0   metoprolol succinate (TOPROL-XL) 25 MG 24 hr tablet, Take 1 tablet (25 mg total) by mouth daily., Disp: 90 tablet, Rfl: 3   molnupiravir EUA (LAGEVRIO) 200 mg CAPS capsule, Take 4 capsules (800 mg total) by mouth 2 (two) times daily for 5 days., Disp: 40 capsule, Rfl: 0   rivaroxaban (XARELTO) 20 MG TABS tablet, Take 1 tablet (20 mg total) by mouth daily with supper. Last refill without office visit please call [phone] to schedule, Disp: 30 tablet, Rfl: 0  EXAM:  VITALS per patient if applicable:  GENERAL: alert, oriented, appears well and in no acute distress  HEENT: atraumatic, conjunttiva clear, no obvious abnormalities on inspection of external nose and ears  NECK: normal movements of the head and neck  LUNGS: on inspection no signs of respiratory distress, breathing rate appears normal, no obvious  gross SOB, gasping or wheezing  CV: no obvious cyanosis  MS: moves all visible extremities without noticeable abnormality  PSYCH/NEURO: pleasant and cooperative, no obvious depression or anxiety, speech and thought processing grossly intact  ASSESSMENT AND PLAN:  Discussed the following assessment and plan:  COVID-19   Discussed treatment options and risk of drug interactions, ideal treatment window, potential complications, isolation and precautions for COVID-19.  After lengthy discussion, the patient opted for treatment with Legevrio due to being higher risk for complications of covid or severe disease and other factors. Discussed EUA status of this drug and the fact that there is preliminary limited knowledge of risks/interactions/side effects per EUA document vs possible benefits and precautions. This information was shared with patient during the visit and also was provided in patient instructions. Also, advised that patient discuss risks/interactions and use with pharmacist/treatment team as well.   The patient did want a prescription for cough, Tessalon Rx sent.  Other symptomatic care measures summarized in patient instructions. Work/School slipped offered:  declined Advised to seek prompt virtual visit or in person care if worsening, new symptoms arise, or if is not improving with treatment as expected per our conversation of expected course. Discussed options for follow up care. Did let this patient know that  I do telemedicine on Tuesdays and Thursdays for [place] and those are the days I am logged into the system. Advised to schedule follow up visit with PCP, [place] virtual visits or [facility] if any further questions or concerns to avoid delays in care.   I discussed the assessment and treatment plan with the patient. The patient was provided an opportunity to ask questions and all were answered. The patient agreed with the plan and demonstrated an understanding of the instructions.      Terressa Koyanagi, DO

## 2021-07-11 NOTE — Telephone Encounter (Signed)
Patient informed of the message below.

## 2021-08-21 ENCOUNTER — Ambulatory Visit (HOSPITAL_COMMUNITY): Payer: BC Managed Care – PPO | Admitting: Physician Assistant

## 2021-08-28 ENCOUNTER — Ambulatory Visit (HOSPITAL_COMMUNITY): Payer: BC Managed Care – PPO | Admitting: Physician Assistant

## 2021-09-15 ENCOUNTER — Ambulatory Visit (HOSPITAL_COMMUNITY)
Admission: RE | Admit: 2021-09-15 | Discharge: 2021-09-15 | Disposition: A | Discharge status: Home / Self Care | Payer: BC Managed Care – PPO | Source: Ambulatory Visit | Attending: Physician Assistant | Admitting: Physician Assistant

## 2021-09-15 ENCOUNTER — Encounter (HOSPITAL_COMMUNITY): Payer: Self-pay | Admitting: Physician Assistant

## 2021-09-15 VITALS — BP 138/80 | HR 92 | Ht 74.0 in | Wt 319.6 lb

## 2021-09-15 DIAGNOSIS — I5022 Chronic systolic (congestive) heart failure: Secondary | ICD-10-CM | POA: Diagnosis not present

## 2021-09-15 DIAGNOSIS — Z7901 Long term (current) use of anticoagulants: Secondary | ICD-10-CM | POA: Diagnosis not present

## 2021-09-15 DIAGNOSIS — I4819 Other persistent atrial fibrillation: Secondary | ICD-10-CM | POA: Insufficient documentation

## 2021-09-15 DIAGNOSIS — I110 Hypertensive heart disease with heart failure: Secondary | ICD-10-CM | POA: Diagnosis not present

## 2021-09-15 DIAGNOSIS — R000 Tachycardia, unspecified: Secondary | ICD-10-CM | POA: Insufficient documentation

## 2021-09-15 DIAGNOSIS — I4892 Unspecified atrial flutter: Secondary | ICD-10-CM | POA: Diagnosis not present

## 2021-09-15 DIAGNOSIS — Z6841 Body Mass Index (BMI) 40.0 and over, adult: Secondary | ICD-10-CM | POA: Diagnosis not present

## 2021-09-15 DIAGNOSIS — G4733 Obstructive sleep apnea (adult) (pediatric): Secondary | ICD-10-CM | POA: Diagnosis not present

## 2021-09-15 DIAGNOSIS — I739 Peripheral vascular disease, unspecified: Secondary | ICD-10-CM | POA: Diagnosis not present

## 2021-09-15 DIAGNOSIS — E669 Obesity, unspecified: Secondary | ICD-10-CM | POA: Insufficient documentation

## 2021-09-15 DIAGNOSIS — D6869 Other thrombophilia: Secondary | ICD-10-CM | POA: Diagnosis not present

## 2021-09-15 DIAGNOSIS — E119 Type 2 diabetes mellitus without complications: Secondary | ICD-10-CM | POA: Insufficient documentation

## 2021-09-15 MED ORDER — FLECAINIDE ACETATE 100 MG PO TABS: 100.0000 mg | ORAL_TABLET | Freq: Two times a day (BID) | ORAL | 3 refills | Status: DC

## 2021-09-15 NOTE — Patient Instructions (Signed)
Increase flecainide 100mg twice a day 

## 2021-09-15 NOTE — Progress Notes (Signed)
? ? ?Primary Care Physician: Cassandria Anger, MD ?Primary Cardiologist: Dr Haroldine Laws ?Primary Electrophysiologist: Dr Rayann Heman ?Referring Physician: Dr Rayann Heman ? ? ?Lance [name] is a 41 y.o. male with a history of persistent atrial fibrillation, atrial flutter, OSA, tachycardia mediated CM, PVD who presents for follow up in the [facility]. Patient was admitted in 2014 with afib RVR and was loaded on amiodarone and became profoundly hypotensive requiring levophed and IVF.  He underwent TEE and failed DCCV on 3/9.  Dr. Lovena Le placed him on Tikosyn and he had polymorphic VT.  Transitioned to oral amiodarone with good rate response.  Echo during admission showed EF 10% with diffuse hypokinesis.  He was diuresed and discharged on amiodarone, xarelto, digoxin, ramipril, lopressor and lasix.  S/P A-Flutter ablation 2014. EF subsequently returned to normal. Readmitted in July 2017 with HF and found to have recurrent AF with RVR. EF back down to 25-30%. Underwent attempt at [facility] on 01/03/16. Maintained NSR for 2 mintues and then back to AF. He was placed on IV amiodarone with close monitoring of BP along with oral amiodarone for faster loading. He eventually converted back to NSR on IV amiodarone. He continued to have episodes of symptomatic afib. He underwent repeat afib ablation on 03/30/19. He is on Xarelto for a CHADS2VASC score of 2. ? ?Patient was seen by Dr Rayann Heman 03/31/21 and was having palpitations about twice per week. A heart monitor 02/2020 showed 2% afib burden. He was started on flecainide.  ? ?On follow up today, patient reports that he has a couple days per week with elevated heart rates 100-130s. He denies any other associated symptoms. He has only been taking his flecainide once daily and is intermittently compliant with his BiPap.  ? ?Today, he denies symptoms of chest pain, shortness of breath, orthopnea, PND, lower extremity edema, dizziness, presyncope, syncope, bleeding,  or neurologic sequela. The patient is tolerating medications without difficulties and is otherwise without complaint today.  ? ? ?Atrial Fibrillation Risk Factors: ? ?he does have symptoms or diagnosis of sleep apnea. ?he is not compliant with BiPap therapy. ?he does not have a history of rheumatic fever. ?he does not have a history of alcohol use. ?The patient does not have a history of early familial atrial fibrillation or other arrhythmias. ? ?he has a BMI of Body mass index is 41.03 kg/m?Marland KitchenMarland Kitchen ?Filed Weights  ? 09/15/21 1533  ?Weight: (!) 145 kg  ? ? ? ? ?Family History  ?Problem Relation Age of Onset  ? Diabetes Mother   ? Hypertension Mother   ? Varicose Veins Mother   ? Depression Father   ? Osteoarthritis Father   ? Alcohol abuse Father   ? ? ? ?Atrial Fibrillation Management history: ? ?Previous antiarrhythmic drugs: flecainide, dofetilide, amiodarone ?Previous cardioversions: 2014, 2017, 2018 ?Previous ablations: flutter with Dr Lovena Le 2014, afib ablations 2015, 03/30/19 ?CHADS2VASC score: 2 ?Anticoagulation history: Xarelto ? ? ?Past Medical History:  ?Diagnosis Date  ? Acute systolic heart failure ([place]) 09/14/2012  ? Anxiety 03/27/2020  ? Atrial flutter [facility])   ? s/p ablation by Dr Lovena Le  ? Medical history non-contributory   ? Neuromuscular disorder ([place])   ? Onychomycosis   ? OSA on CPAP   ? Peripheral vascular disease ([place])   ? Persistent atrial fibrillation ([place]) 2003  ? ?Past Surgical History:  ?Procedure Laterality Date  ? ATRIAL FIBRILLATION ABLATION N/A 02/15/2014  ? Procedure: ATRIAL FIBRILLATION ABLATION;  Surgeon: Coralyn Mark, MD;  Location:  [place] CATH LAB;  Service: Cardiovascular;  Laterality: N/A;  ? ATRIAL FIBRILLATION ABLATION N/A 03/30/2019  ? Procedure: ATRIAL FIBRILLATION ABLATION;  Surgeon: Thompson Grayer, MD;  Location: [place] CV LAB;  Service: Cardiovascular;  Laterality: N/A;  ? ATRIAL FLUTTER ABLATION  09/14/2012  ? by Dr Lovena Le  ? ATRIAL FLUTTER ABLATION N/A 09/14/2012  ? Procedure:  ATRIAL FLUTTER ABLATION;  Surgeon: Evans Lance, MD;  Location: [facility] CATH LAB;  Service: Cardiovascular;  Laterality: N/A;  ? CARDIAC CATHETERIZATION    ? CARDIOVERSION N/A 08/14/2012  ? Procedure: CARDIOVERSION;  Surgeon: Jolaine Artist, MD;  Location: [place];  Service: Cardiovascular;  Laterality: N/A;  ? CARDIOVERSION N/A 01/03/2016  ? Procedure: CARDIOVERSION;  Surgeon: Fay Records, MD;  Location: [place];  Service: Cardiovascular;  Laterality: N/A;  ? CARDIOVERSION N/A 03/08/2017  ? Procedure: CARDIOVERSION;  Surgeon: Jolaine Artist, MD;  Location: [facility] ENDOSCOPY;  Service: Cardiovascular;  Laterality: N/A;  ? MANDIBLE SURGERY    ? underbite correction  ? TEE WITHOUT CARDIOVERSION N/A 02/14/2014  ? Procedure: TRANSESOPHAGEAL ECHOCARDIOGRAM (TEE);  Surgeon: Sueanne Margarita, MD;  Location: [place];  Service: Cardiovascular;  Laterality: N/A;  ? TEE WITHOUT CARDIOVERSION N/A 01/03/2016  ? Procedure: TRANSESOPHAGEAL ECHOCARDIOGRAM (TEE);  Surgeon: Fay Records, MD;  Location: [place];  Service: Cardiovascular;  Laterality: N/A;  ? ? ?Current Outpatient Medications  ?Medication Sig Dispense Refill  ? acetaminophen (TYLENOL) 500 MG tablet Take 1,000 mg by mouth every 6 (six) hours as needed for headache.    ? B Complex-Folic Acid (B COMPLEX PLUS) TABS Take 1 tablet by mouth daily. 100 tablet 3  ? busPIRone (BUSPAR) 10 MG tablet Take 0.5-1 tablets (5-10 mg total) by mouth 2 (two) times daily. 180 tablet 1  ? Cholecalciferol (VITAMIN D3) 50 MCG (2000 UT) capsule Take 1 capsule (2,000 Units total) by mouth daily. 100 capsule 3  ? fexofenadine (ALLEGRA) 30 MG tablet Take 30 mg by mouth at bedtime as needed.     ? losartan (COZAAR) 25 MG tablet Take 1 tablet (25 mg total) by mouth daily. Last refill without office visit please call [phone] to schedule 30 tablet 0  ? metoprolol succinate (TOPROL-XL) 25 MG 24 hr tablet Take 1 tablet (25 mg total) by mouth daily. 90 tablet 3  ? rivaroxaban (XARELTO) 20 MG  TABS tablet Take 1 tablet (20 mg total) by mouth daily with supper. Last refill without office visit please call [phone] to schedule 30 tablet 0  ? flecainide (TAMBOCOR) 100 MG tablet Take 1 tablet (100 mg total) by mouth 2 (two) times daily. 60 tablet 3  ? ?No current facility-administered medications for this encounter.  ? ? ?Allergies  ?Allergen Reactions  ? Amiodarone Other (See Comments)  ?  IV intolerance with Severe hypotension, shock after amiodarone bolus.  Taking po Amiodarone without problems. Pt can tolerate IV drip but not bolus.   ? Altace [Ramipril]   ?  Cough   ? Azithromycin Itching  ? Protonix [Pantoprazole Sodium]   ?  Couldn't belch  ? Fish Allergy Other (See Comments)  ?  Not shell fish - regular fish causes tingling  ? Metoprolol Succinate Other (See Comments)  ?  Body aches at high doses.  Currently takes two tablets of lopressor 25mg  twice daily; three tablets twice daily caused him to ache.  ? ? ?Social History  ? ?Socioeconomic History  ? Marital status: Single  ?  Spouse name: Not on file  ?  Number of children: Not on file  ? Years of education: Not on file  ? Highest education level: Not on file  ?Occupational History  ? Occupation: [employer]  ?  Employer: [employer]  ?Tobacco Use  ? Smoking status: Some Days  ?  Years: 3.00  ?  Types: Cigarettes, Cigars  ?  Last attempt to quit: 05/12/2009  ?  Years since quitting: 12.3  ? Smokeless tobacco: Never  ? Tobacco comments:  ?  Former cigarette smoker smokes cigars once a month 04/30/21  ?Substance and Sexual Activity  ? Alcohol use: Not Currently  ?  Comment: Glass of wine on weekends  ? Drug use: Not Currently  ?  Types: Marijuana  ?  Comment: occassional use   ? Sexual activity: Yes  ?Other Topics Concern  ? Not on file  ?Social History Narrative  ? Not on file  ? ?Social Determinants of Health  ? ?Financial Resource Strain: Not on file  ?Food Insecurity: Not on file  ?Transportation Needs: Not on file  ?Physical Activity: Not on  file  ?Stress: Not on file  ?Social Connections: Not on file  ?Intimate Partner Violence: Not on file  ? ? ? ?ROS- All systems are reviewed and negative except as per the HPI above. ? ?Physical Exam:

## 2021-09-29 ENCOUNTER — Encounter (HOSPITAL_COMMUNITY): Payer: BC Managed Care – PPO | Admitting: Physician Assistant

## 2021-10-06 ENCOUNTER — Ambulatory Visit (HOSPITAL_COMMUNITY)
Admission: RE | Admit: 2021-10-06 | Discharge: 2021-10-06 | Disposition: A | Discharge status: Home / Self Care | Payer: BC Managed Care – PPO | Source: Ambulatory Visit | Attending: Physician Assistant | Admitting: Physician Assistant

## 2021-10-06 DIAGNOSIS — R000 Tachycardia, unspecified: Secondary | ICD-10-CM | POA: Diagnosis not present

## 2021-10-06 DIAGNOSIS — Z79899 Other long term (current) drug therapy: Secondary | ICD-10-CM | POA: Diagnosis not present

## 2021-10-06 DIAGNOSIS — I4819 Other persistent atrial fibrillation: Secondary | ICD-10-CM

## 2021-10-06 DIAGNOSIS — Z5181 Encounter for therapeutic drug level monitoring: Secondary | ICD-10-CM | POA: Diagnosis not present

## 2021-10-06 NOTE — Progress Notes (Signed)
Patient returns for ECG after increasing flecainide. Patient reports that he had 1-2 days of heart racing soon after increasing the medication but none since. He is tolerating the medication without difficulty. ECG shows SR HR 75, PR 154, QRS 112, QTc 442. F/u in the AF clinic in 4 months.  ?

## 2021-10-13 ENCOUNTER — Telehealth (HOSPITAL_COMMUNITY): Payer: Self-pay | Admitting: *Deleted

## 2021-10-13 MED ORDER — METOPROLOL SUCCINATE ER 25 MG PO TB24
50.0000 mg | ORAL_TABLET | Freq: Every day | ORAL | 3 refills | Status: DC
Start: 2021-10-13 — End: 2021-10-16

## 2021-10-13 NOTE — Telephone Encounter (Signed)
Pt called in stating since being seen last week he has had an increase in frequency/duration of afib. HRs in the 140s and not feeling the best. Discussed with Jorja Loa PA will increase metoprolol to 50mg  once a day and see back in clinic. Pt in agreement. ?

## 2021-10-16 ENCOUNTER — Encounter (HOSPITAL_COMMUNITY): Payer: Self-pay | Admitting: Physician Assistant

## 2021-10-16 ENCOUNTER — Ambulatory Visit (HOSPITAL_COMMUNITY)
Admission: RE | Admit: 2021-10-16 | Discharge: 2021-10-16 | Disposition: A | Discharge status: Home / Self Care | Payer: BC Managed Care – PPO | Source: Ambulatory Visit | Attending: Physician Assistant | Admitting: Physician Assistant

## 2021-10-16 VITALS — BP 118/70 | HR 84 | Ht 74.0 in | Wt 323.2 lb

## 2021-10-16 DIAGNOSIS — I4819 Other persistent atrial fibrillation: Secondary | ICD-10-CM | POA: Diagnosis not present

## 2021-10-16 DIAGNOSIS — Z6841 Body Mass Index (BMI) 40.0 and over, adult: Secondary | ICD-10-CM | POA: Diagnosis not present

## 2021-10-16 DIAGNOSIS — I493 Ventricular premature depolarization: Secondary | ICD-10-CM | POA: Insufficient documentation

## 2021-10-16 DIAGNOSIS — D6869 Other thrombophilia: Secondary | ICD-10-CM

## 2021-10-16 DIAGNOSIS — I5022 Chronic systolic (congestive) heart failure: Secondary | ICD-10-CM | POA: Insufficient documentation

## 2021-10-16 DIAGNOSIS — I110 Hypertensive heart disease with heart failure: Secondary | ICD-10-CM | POA: Diagnosis not present

## 2021-10-16 DIAGNOSIS — F17210 Nicotine dependence, cigarettes, uncomplicated: Secondary | ICD-10-CM | POA: Insufficient documentation

## 2021-10-16 DIAGNOSIS — G4733 Obstructive sleep apnea (adult) (pediatric): Secondary | ICD-10-CM | POA: Diagnosis not present

## 2021-10-16 DIAGNOSIS — Z79899 Other long term (current) drug therapy: Secondary | ICD-10-CM | POA: Diagnosis not present

## 2021-10-16 DIAGNOSIS — E669 Obesity, unspecified: Secondary | ICD-10-CM | POA: Insufficient documentation

## 2021-10-16 DIAGNOSIS — Z8249 Family history of ischemic heart disease and other diseases of the circulatory system: Secondary | ICD-10-CM | POA: Insufficient documentation

## 2021-10-16 DIAGNOSIS — Z7901 Long term (current) use of anticoagulants: Secondary | ICD-10-CM | POA: Insufficient documentation

## 2021-10-16 MED ORDER — METOPROLOL SUCCINATE ER 50 MG PO TB24: 50.0000 mg | ORAL_TABLET | Freq: Every day | ORAL | 3 refills | Status: DC

## 2021-10-16 NOTE — Progress Notes (Signed)
? ? ?Primary Care Physician: Tresa Garter, MD ?Primary Cardiologist: Dr [name] ?Primary Electrophysiologist: Dr Johney Frame ?Referring Physician: Dr Johney Frame ? ? ?Lance [name] is a 41 y.o. male with a history of persistent atrial fibrillation, atrial flutter, OSA, tachycardia mediated CM, PVD who presents for follow up in the [facility]. Patient was admitted in 2014 with afib RVR and was loaded on amiodarone and became profoundly hypotensive requiring levophed and IVF.  He underwent TEE and failed DCCV on 3/9.  Dr. [name] placed him on Tikosyn and he had polymorphic VT.  Transitioned to oral amiodarone with good rate response.  Echo during admission showed EF 10% with diffuse hypokinesis.  He was diuresed and discharged on amiodarone, xarelto, digoxin, ramipril, lopressor and lasix.  S/P A-Flutter ablation 2014. EF subsequently returned to normal. Readmitted in July 2017 with HF and found to have recurrent AF with RVR. EF back down to 25-30%. Underwent attempt at [facility] on 01/03/16. Maintained NSR for 2 mintues and then back to AF. He was placed on IV amiodarone with close monitoring of BP along with oral amiodarone for faster loading. He eventually converted back to NSR on IV amiodarone. He continued to have episodes of symptomatic afib. He underwent repeat afib ablation on 03/30/19. He is on Xarelto for a CHADS2VASC score of 2. ? ?Patient was seen by Dr Johney Frame 03/31/21 and was having palpitations about twice per week. A heart monitor 02/2020 showed 2% afib burden. He was started on flecainide.  ? ?On follow up today, patient reports that he had several days of rapid heart rates since increasing the flecainide. His metoprolol was increased which has helped the past 3 days. He is overall frustrated with the persistence of his afib. He is intermittently compliant with BiPap therapy.  ? ?Today, he denies symptoms of chest pain, shortness of breath, orthopnea, PND, lower extremity edema,  dizziness, presyncope, syncope, bleeding, or neurologic sequela. The patient is tolerating medications without difficulties and is otherwise without complaint today.  ? ? ?Atrial Fibrillation Risk Factors: ? ?he does have symptoms or diagnosis of sleep apnea. ?he is not compliant with BiPap therapy. ?he does not have a history of rheumatic fever. ?he does not have a history of alcohol use. ?The patient does not have a history of early familial atrial fibrillation or other arrhythmias. ? ?he has a BMI of Body mass index is 41.5 kg/m?Marland KitchenMarland Kitchen ?Filed Weights  ? 10/16/21 1524  ?Weight: (!) 146.6 kg  ? ? ?Family History  ?Problem Relation Age of Onset  ? Diabetes Mother   ? Hypertension Mother   ? Varicose Veins Mother   ? Depression Father   ? Osteoarthritis Father   ? Alcohol abuse Father   ? ? ? ?Atrial Fibrillation Management history: ? ?Previous antiarrhythmic drugs: flecainide, dofetilide, amiodarone ?Previous cardioversions: 2014, 2017, 2018 ?Previous ablations: flutter with Dr [name] 2014, afib ablations 2015, 03/30/19 ?CHADS2VASC score: 2 ?Anticoagulation history: Xarelto ? ? ?Past Medical History:  ?Diagnosis Date  ? Acute systolic heart failure ([facility]) 09/14/2012  ? Anxiety 03/27/2020  ? Atrial flutter [facility])   ? s/p ablation by Dr [name]  ? Medical history non-contributory   ? Neuromuscular disorder ([facility])   ? Onychomycosis   ? OSA on CPAP   ? Peripheral vascular disease ([facility])   ? Persistent atrial fibrillation ([facility]) 2003  ? ?Past Surgical History:  ?Procedure Laterality Date  ? ATRIAL FIBRILLATION ABLATION N/A 02/15/2014  ? Procedure: ATRIAL FIBRILLATION ABLATION;  Surgeon: Quita Skye  Allred, MD;  Location: [facility] CATH LAB;  Service: Cardiovascular;  Laterality: N/A;  ? ATRIAL FIBRILLATION ABLATION N/A 03/30/2019  ? Procedure: ATRIAL FIBRILLATION ABLATION;  Surgeon: Hillis Range, MD;  Location: [facility] INVASIVE CV LAB;  Service: Cardiovascular;  Laterality: N/A;  ? ATRIAL FLUTTER ABLATION  09/14/2012  ? by Dr [name]  ? ATRIAL  FLUTTER ABLATION N/A 09/14/2012  ? Procedure: ATRIAL FLUTTER ABLATION;  Surgeon: Marinus Maw, MD;  Location: [facility] CATH LAB;  Service: Cardiovascular;  Laterality: N/A;  ? CARDIAC CATHETERIZATION    ? CARDIOVERSION N/A 08/14/2012  ? Procedure: CARDIOVERSION;  Surgeon: Dolores Patty, MD;  Location: [facility] OR;  Service: Cardiovascular;  Laterality: N/A;  ? CARDIOVERSION N/A 01/03/2016  ? Procedure: CARDIOVERSION;  Surgeon: Pricilla Riffle, MD;  Location: [facility] ENDOSCOPY;  Service: Cardiovascular;  Laterality: N/A;  ? CARDIOVERSION N/A 03/08/2017  ? Procedure: CARDIOVERSION;  Surgeon: Dolores Patty, MD;  Location: [facility] ENDOSCOPY;  Service: Cardiovascular;  Laterality: N/A;  ? MANDIBLE SURGERY    ? underbite correction  ? TEE WITHOUT CARDIOVERSION N/A 02/14/2014  ? Procedure: TRANSESOPHAGEAL ECHOCARDIOGRAM (TEE);  Surgeon: Quintella Reichert, MD;  Location: [facility] ENDOSCOPY;  Service: Cardiovascular;  Laterality: N/A;  ? TEE WITHOUT CARDIOVERSION N/A 01/03/2016  ? Procedure: TRANSESOPHAGEAL ECHOCARDIOGRAM (TEE);  Surgeon: Pricilla Riffle, MD;  Location: [facility] ENDOSCOPY;  Service: Cardiovascular;  Laterality: N/A;  ? ? ?Current Outpatient Medications  ?Medication Sig Dispense Refill  ? acetaminophen (TYLENOL) 500 MG tablet Take 1,000 mg by mouth every 6 (six) hours as needed for headache.    ? B Complex-Folic Acid (B COMPLEX PLUS) TABS Take 1 tablet by mouth daily. 100 tablet 3  ? busPIRone (BUSPAR) 10 MG tablet Take 0.5-1 tablets (5-10 mg total) by mouth 2 (two) times daily. 180 tablet 1  ? Cholecalciferol (VITAMIN D3) 50 MCG (2000 UT) capsule Take 1 capsule (2,000 Units total) by mouth daily. 100 capsule 3  ? fexofenadine (ALLEGRA) 30 MG tablet Take 30 mg by mouth at bedtime as needed.     ? flecainide (TAMBOCOR) 100 MG tablet Take 1 tablet (100 mg total) by mouth 2 (two) times daily. 60 tablet 3  ? losartan (COZAAR) 25 MG tablet Take 1 tablet (25 mg total) by mouth daily. Last refill without office visit please call [phone] to schedule  30 tablet 0  ? rivaroxaban (XARELTO) 20 MG TABS tablet Take 1 tablet (20 mg total) by mouth daily with supper. Last refill without office visit please call [phone] to schedule 30 tablet 0  ? metoprolol succinate (TOPROL-XL) 50 MG 24 hr tablet Take 1 tablet (50 mg total) by mouth daily. 30 tablet 3  ? ?No current facility-administered medications for this encounter.  ? ? ?Allergies  ?Allergen Reactions  ? Amiodarone Other (See Comments)  ?  IV intolerance with Severe hypotension, shock after amiodarone bolus.  Taking po Amiodarone without problems. Pt can tolerate IV drip but not bolus.   ? Altace [Ramipril]   ?  Cough   ? Azithromycin Itching  ? Protonix [Pantoprazole Sodium]   ?  Couldn't belch  ? Fish Allergy Other (See Comments)  ?  Not shell fish - regular fish causes tingling  ? Metoprolol Succinate Other (See Comments)  ?  Body aches at high doses.  Currently takes two tablets of lopressor 25mg  twice daily; three tablets twice daily caused him to ache.  ? ? ?Social History  ? ?Socioeconomic History  ? Marital status: Single  ?  Spouse name: Not  on file  ? Number of children: Not on file  ? Years of education: Not on file  ? Highest education level: Not on file  ?Occupational History  ? Occupation: [employer]  ?  Employer: [employer]  ?Tobacco Use  ? Smoking status: Some Days  ?  Years: 3.00  ?  Types: Cigarettes, Cigars  ?  Last attempt to quit: 05/12/2009  ?  Years since quitting: 12.4  ? Smokeless tobacco: Never  ? Tobacco comments:  ?  Former cigarette smoker smokes cigars once a month 04/30/21  ?Substance and Sexual Activity  ? Alcohol use: Not Currently  ?  Comment: Glass of wine on weekends  ? Drug use: Not Currently  ?  Types: Marijuana  ?  Comment: occassional use   ? Sexual activity: Yes  ?Other Topics Concern  ? Not on file  ?Social History Narrative  ? Not on file  ? ?Social Determinants of Health  ? ?Financial Resource Strain: Not on file  ?Food Insecurity: Not on file  ?Transportation  Needs: Not on file  ?Physical Activity: Not on file  ?Stress: Not on file  ?Social Connections: Not on file  ?Intimate Partner Violence: Not on file  ? ? ? ?ROS- All systems are reviewed and negative except

## 2021-11-25 ENCOUNTER — Encounter: Payer: Self-pay | Admitting: Cardiology

## 2021-11-25 ENCOUNTER — Ambulatory Visit: Payer: BC Managed Care – PPO | Admitting: Cardiology

## 2021-11-25 VITALS — BP 114/68 | HR 119 | Ht 74.0 in | Wt 309.8 lb

## 2021-11-25 DIAGNOSIS — I4819 Other persistent atrial fibrillation: Secondary | ICD-10-CM | POA: Diagnosis not present

## 2021-11-25 DIAGNOSIS — D6869 Other thrombophilia: Secondary | ICD-10-CM | POA: Diagnosis not present

## 2021-11-25 MED ORDER — METOPROLOL SUCCINATE ER 50 MG PO TB24: 50.0000 mg | ORAL_TABLET | Freq: Every day | ORAL | 1 refills | Status: DC

## 2021-11-25 MED ORDER — METOPROLOL SUCCINATE ER 50 MG PO TB24: 50.0000 mg | ORAL_TABLET | Freq: Two times a day (BID) | ORAL | 1 refills | Status: DC

## 2021-11-25 MED ORDER — PROPAFENONE HCL ER 325 MG PO CP12: 325.0000 mg | ORAL_CAPSULE | Freq: Two times a day (BID) | ORAL | 3 refills | Status: DC

## 2021-11-25 NOTE — Progress Notes (Signed)
Electrophysiology Office Note   Date:  11/25/2021   ID:  Lance [name], Lance [name] 09-20-1980, MRN [id number]  PCP:  Tresa Garter, MD  Cardiologist:  Bensimhon Primary Electrophysiologist:  Ardith Lewman Jorja Loa, MD    Chief Complaint: AF   History of Present Illness: Lance [name] is a 41 y.o. male who is being seen today for the evaluation of AF at the request of Plotnikov, Georgina Quint, MD. Presenting today for electrophysiology evaluation.  He has a history significant for persistent atrial fibrillation, atrial flutter, sleep apnea, tachycardia mediated cardiomyopathy, peripheral vascular disease.  He was admitted in 2014 with rapid atrial fibrillation was loaded on amiodarone.  He became hypotensive requiring Levophed and IV fluids.  He underwent cardioversion.  He was then started on dofetilide but had polymorphic VT.  He was transitioned to oral amiodarone with good response.  Echo showed an ejection fraction of 10% with diffuse hypokinesis.  He is status post atrial flutter ablation in 2014.  Ejection fraction normalized.  He was readmitted July 2017 with heart failure was found to have atrial fibrillation.  He underwent repeat cardioversion and was restarted on amiodarone.  He had atrial fibrillation ablation 03/30/2019.  He had a repeat monitor that showed a 2% atrial fibrillation burden and was started on flecainide.  Unfortunately continues to have episodes of atrial fibrillation.  He is intermittently compliant with BiPAP.  Today, he denies symptoms of palpitations, chest pain, shortness of breath, orthopnea, PND, lower extremity edema, claudication, dizziness, presyncope, syncope, bleeding, or neurologic sequela. The patient is tolerating medications without difficulties.  He feels well today.  He is unaware of being in atrial fibrillation, though he does check his cardia mobile quite often.  He does not have chest pain or shortness of breath.  He states that he has decreased his  flecainide to 50 mg daily as he felt like it was putting him into atrial fibrillation.  We did discuss further management options for his atrial fibrillation including medications versus hybrid ablation.   Past Medical History:  Diagnosis Date   Acute systolic heart failure (HCC) 09/14/2012   Anxiety 03/27/2020   Atrial flutter (HCC)    s/p ablation by Dr [name]   Medical history non-contributory    Neuromuscular disorder (HCC)    Onychomycosis    OSA on CPAP    Peripheral vascular disease (HCC)    Persistent atrial fibrillation (HCC) 2003   Past Surgical History:  Procedure Laterality Date   ATRIAL FIBRILLATION ABLATION N/A 02/15/2014   Procedure: ATRIAL FIBRILLATION ABLATION;  Surgeon: Gardiner Rhyme, MD;  Location: [facility] CATH LAB;  Service: Cardiovascular;  Laterality: N/A;   ATRIAL FIBRILLATION ABLATION N/A 03/30/2019   Procedure: ATRIAL FIBRILLATION ABLATION;  Surgeon: Hillis Range, MD;  Location: [facility] INVASIVE CV LAB;  Service: Cardiovascular;  Laterality: N/A;   ATRIAL FLUTTER ABLATION  09/14/2012   by Dr [name]   ATRIAL FLUTTER ABLATION N/A 09/14/2012   Procedure: ATRIAL FLUTTER ABLATION;  Surgeon: Marinus Maw, MD;  Location: [facility] CATH LAB;  Service: Cardiovascular;  Laterality: N/A;   CARDIAC CATHETERIZATION     CARDIOVERSION N/A 08/14/2012   Procedure: CARDIOVERSION;  Surgeon: Dolores Patty, MD;  Location: [facility] OR;  Service: Cardiovascular;  Laterality: N/A;   CARDIOVERSION N/A 01/03/2016   Procedure: CARDIOVERSION;  Surgeon: Pricilla Riffle, MD;  Location: [facility] ENDOSCOPY;  Service: Cardiovascular;  Laterality: N/A;   CARDIOVERSION N/A 03/08/2017   Procedure: CARDIOVERSION;  Surgeon: Dolores Patty, MD;  Location:  [facility] ENDOSCOPY;  Service: Cardiovascular;  Laterality: N/A;   MANDIBLE SURGERY     underbite correction   TEE WITHOUT CARDIOVERSION N/A 02/14/2014   Procedure: TRANSESOPHAGEAL ECHOCARDIOGRAM (TEE);  Surgeon: Sueanne Margarita, MD;  Location: [facility] ENDOSCOPY;  Service:  Cardiovascular;  Laterality: N/A;   TEE WITHOUT CARDIOVERSION N/A 01/03/2016   Procedure: TRANSESOPHAGEAL ECHOCARDIOGRAM (TEE);  Surgeon: Fay Records, MD;  Location: [facility] ENDOSCOPY;  Service: Cardiovascular;  Laterality: N/A;     Current Outpatient Medications  Medication Sig Dispense Refill   acetaminophen (TYLENOL) 500 MG tablet Take 1,000 mg by mouth every 6 (six) hours as needed for headache.     B Complex-Folic Acid (B COMPLEX PLUS) TABS Take 1 tablet by mouth daily. 100 tablet 3   busPIRone (BUSPAR) 10 MG tablet Take 0.5-1 tablets (5-10 mg total) by mouth 2 (two) times daily. 180 tablet 1   Cholecalciferol (VITAMIN D3) 50 MCG (2000 UT) capsule Take 1 capsule (2,000 Units total) by mouth daily. 100 capsule 3   fexofenadine (ALLEGRA) 30 MG tablet Take 30 mg by mouth at bedtime as needed.      losartan (COZAAR) 25 MG tablet Take 1 tablet (25 mg total) by mouth daily. Last refill without office visit please call [phone] to schedule 30 tablet 0   propafenone (RYTHMOL SR) 325 MG 12 hr capsule Take 1 capsule (325 mg total) by mouth 2 (two) times daily. 60 capsule 3   rivaroxaban (XARELTO) 20 MG TABS tablet Take 1 tablet (20 mg total) by mouth daily with supper. Last refill without office visit please call [phone] to schedule 30 tablet 0   metoprolol succinate (TOPROL-XL) 50 MG 24 hr tablet Take 1 tablet (50 mg total) by mouth in the morning and at bedtime. Take with or immediately following a meal. 180 tablet 1   No current facility-administered medications for this visit.    Allergies:   Amiodarone, Altace [ramipril], Azithromycin, Protonix [pantoprazole sodium], Fish allergy, and Metoprolol succinate   Social History:  The patient  reports that he has been smoking cigarettes and cigars. He has never used smokeless tobacco. He reports that he does not currently use alcohol. He reports that he does not currently use drugs after having used the following drugs: Marijuana.   Family  History:  The patient's family history includes Alcohol abuse in his father; Depression in his father; Diabetes in his mother; Hypertension in his mother; Osteoarthritis in his father; Varicose Veins in his mother.    ROS:  Please see the history of present illness.   Otherwise, review of systems is positive for none.   All other systems are reviewed and negative.    PHYSICAL EXAM: VS:  BP 114/68   Pulse (!) 119   Ht 6\' 2"  (1.88 m)   Wt (!) 309 lb 12.8 oz (140.5 kg)   SpO2 97%   BMI 39.78 kg/m  , BMI Body mass index is 39.78 kg/m. GEN: Well nourished, well developed, in no acute distress  HEENT: normal  Neck: no JVD, carotid bruits, or masses Cardiac: irregular, tachycardic; no murmurs, rubs, or gallops,no edema  Respiratory:  clear to auscultation bilaterally, normal work of breathing GI: soft, nontender, nondistended, + BS MS: no deformity or atrophy  Skin: warm and dry Neuro:  Strength and sensation are intact Psych: euthymic mood, full affect  EKG:  EKG is ordered today. Personal review of the ekg ordered shows atrial fibrillation, rate 119  Recent Labs: 04/04/2021: ALT 22; BUN 11; Creatinine, Ser  1.03; Hemoglobin 14.4; Platelets 252.0; Potassium 4.3; Sodium 141; TSH 2.60    Lipid Panel     Component Value Date/Time   CHOL 126 04/04/2021 1555   TRIG 73.0 04/04/2021 1555   HDL 29.80 (L) 04/04/2021 1555   CHOLHDL 4 04/04/2021 1555   VLDL 14.6 04/04/2021 1555   LDLCALC 81 04/04/2021 1555     Wt Readings from Last 3 Encounters:  11/25/21 (!) 309 lb 12.8 oz (140.5 kg)  10/16/21 (!) 323 lb 3.2 oz (146.6 kg)  09/15/21 (!) 319 lb 9.6 oz (145 kg)      Other studies Reviewed: Additional studies/ records that were reviewed today include: TTE 2021  Review of the above records today demonstrates:   1. Left ventricular ejection fraction, by estimation, is 60 to 65%. The  left ventricle has normal function. The left ventricle has no regional  wall motion abnormalities.  Left ventricular diastolic parameters are  indeterminate. The average left  ventricular global longitudinal strain is -14.6 %. The global longitudinal  strain is abnormal but there was a PVC that occurred during the cycle on  the apical 2 chamber view and therefore not accurate.   2. Right ventricular systolic function is normal. The right ventricular  size is normal.   3. The mitral valve is normal in structure. No evidence of mitral valve  regurgitation. No evidence of mitral stenosis.   4. The aortic valve is normal in structure. Aortic valve regurgitation is  not visualized. No aortic stenosis is present.   5. Aortic dilatation noted. There is mild dilatation of the aortic root,  measuring 39 mm.   6. The inferior vena cava is normal in size with greater than 50%  respiratory variability, suggesting right atrial pressure of 3 mmHg.   7. Compared to echo a year ago, this study is unchanged.    ASSESSMENT AND PLAN:  1.  Persistent atrial fibrillation/flutter: Status post flutter ablation in 2014, atrial fibrillation ablation in 2015 and 03/19/2019.  He has failed dofetilide with polymorphic VT.  Currently on flecainide 100 mg twice daily, Toprol 50 mg daily, Xarelto 20 mg daily.  High risk medication monitoring for flecainide.  CHA2DS2-VASc of 2.  He is continued to have episodes of atrial fibrillation.  He feels that the flecainide is not working to keep him in normal rhythm.  There are fast when he is in atrial fibrillation.  We Adar Rase increase his Toprol-XL to 50 mg twice daily.  We Tajae Rybicki also start him on propafenone 325 mg twice daily.  We Ilya Ess have him follow-up in 1 month.  If he continues to have episodes of atrial fibrillation, propafenone can be increased.  2.  Obstructive sleep apnea: BiPAP compliance encouraged  3.  Obesity: Diet and exercise encouraged Body mass index is 39.78 kg/m.  4.  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  No signs of volume overload.   Continue current management.  5.  Hypertension: Well-controlled  6.  Secondary hypercoagulable state: Currently on Xarelto for atrial fibrillation as above  Current medicines are reviewed at length with the patient today.   The patient does not have concerns regarding his medicines.  The following changes were made today: Stop flecainide, start propafenone, increase Toprol-XL  Labs/ tests ordered today include:  Orders Placed This Encounter  Procedures   EKG 12-Lead     Disposition:   FU EP app 1 month  Signed, Kawhi Diebold Jorja Loa, MD  11/25/2021 11:51 AM     [facility]  [address] [place] [address] [phone] (office) [phone] (fax)

## 2021-11-25 NOTE — Patient Instructions (Addendum)
Medication Instructions:  Your physician has recommended you make the following change in your medication:  STOP Flecainide START Propafenone 325 mg twice a day INCREASE Metoprolol Succinate (Toprol) to 50 mg twice a day  *If you need a refill on your cardiac medications before your next appointment, please call your pharmacy*   Lab Work: None ordered   Testing/Procedures: None ordered   Follow-Up: At [facility], you and your health needs are our priority.  As part of our continuing mission to provide you with exceptional heart care, we have created designated Provider Care Teams.  These Care Teams include your primary Cardiologist (physician) and Advanced Practice Providers (APPs -  Physician Assistants and Nurse Practitioners) who all work together to provide you with the care you need, when you need it.  Your next appointment:   1 month(s)  The format for your next appointment:   In Person  Provider:   You will see one of the following Advanced Practice Providers on your designated Care Team:   Francis Dowse, [place] Casimiro Needle "Mardelle Matte" Lanna Poche, [place]    Thank you for choosing [facility]!!   Dory Horn, RN [phone]  Other Instructions   Important Information About Sugar      Propafenone Tablets What is this medication? PROPAFENONE (proe pa FEEN one) prevents and treats a fast or irregular heartbeat (arrhythmia). It is often used to treat a type of arrhythmia known as AFib (atrial fibrillation). It works by slowing down overactive electric signals in the heart, which stabilizes your heart rhythm. It belongs to a group of medications called antiarrhythmics. This medicine may be used for other purposes; ask your health care provider or pharmacist if you have questions. COMMON BRAND NAME(S): Rythmol What should I tell my care team before I take this medication? They need to know if you have any of these conditions: Heart disease High potassium level Kidney  disease Liver disease Low blood pressure Lung or breathing disease like asthma, chronic bronchitis, or emphysema Pacemaker Slow heart rate An unusual or allergic reaction to propafenone, other medications, foods, dyes, or preservatives Pregnant or trying to get pregnant Breast-feeding How should I use this medication? Take this medication by mouth with a glass of water. Follow the directions on the prescription label. You can take this medication with or without food. Take your doses at regular intervals. Do not take your medication more often than directed. Do not stop taking except on the advice of your care team. Talk to your care team regarding the use of this medication in children. Special care may be needed. Overdosage: If you think you have taken too much of this medicine contact a poison control center or emergency room at once. NOTE: This medicine is only for you. Do not share this medicine with others. What if I miss a dose? If you miss a dose, take it as soon as you can. If it is almost time for your next dose, take only that dose. Do not take double or extra doses. What may interact with this medication? Do not take this medication with any of the following: Arsenic trioxide Certain antibiotics like clarithromycin, erythromycin, grepafloxacin, pentamidine, sparfloxacin, troleandomycin Certain medications for depression or mental illness like amoxapine, haloperidol, maprotiline, pimozide, sertindole, thioridazine, tricyclic antidepressants Certain medications for fungal infections like fluconazole, itraconazole, ketoconazole, posaconazole, voriconazole Certain medications for irregular heartbeat like dronedarone Certain medications for malaria like chloroquine, halofantrine Cisapride Droperidol Levomethadyl Ranolazine This medication may also interact with the following: Certain medications for  angina or blood pressure Certain medications for asthma or breathing difficulties  like formoterol, salmeterol Certain medications that treat or prevent blood clots like warfarin Cimetidine Cyclosporine Digoxin Diuretics Local anesthetics Other medications that prolong the QT interval (cause an abnormal heart rhythm) like dofetilide, ziprasidone Rifampin Ritonavir Theophylline This list may not describe all possible interactions. Give your health care provider a list of all the medicines, herbs, non-prescription drugs, or dietary supplements you use. Also tell them if you smoke, drink alcohol, or use illegal drugs. Some items may interact with your medicine. What should I watch for while using this medication? Your condition will be monitored closely when you first begin therapy. Often, this medication is first started in a hospital or other monitored health care setting. Once you are on maintenance therapy, visit your care team for regular checks on your progress. Because your condition and use of this medication carry some risk, it is a good idea to carry an identification card, necklace or bracelet with details of your condition, medications, and care team. You may get drowsy or dizzy. Do not drive, use machinery, or do anything that needs mental alertness until you know how this medication affects you. Do not stand or sit up quickly, especially if you are an older patient. This reduces the risk of dizzy or fainting spells. If you are going to have surgery, tell your care team that you are taking this medication. What side effects may I notice from receiving this medication? Side effects that you should report to your care team as soon as possible: Allergic reactions--skin rash, itching, hives, swelling of the face, lips, tongue, or throat Heart failure--shortness of breath, swelling of the ankles, feet, or hands, sudden weight gain, unusual weakness or fatigue Heart rhythm changes--fast or irregular heartbeat, dizziness, feeling faint or lightheaded, chest pain, trouble  breathing Infection--fever, chills, cough, sore throat Unusual bruising or bleeding Side effects that usually do not require medical attention (report to your care team if they continue or are bothersome): Change in taste Constipation Dizziness Fatigue Nausea This list may not describe all possible side effects. Call your doctor for medical advice about side effects. You may report side effects to FDA at 1-800-FDA-1088. Where should I keep my medication? Keep out of the reach of children and pets. Store at room temperature between 15 and 30 degrees C (59 and 86 degrees F). Protect from light. Keep container tightly closed. Throw away any unused medication after the expiration date. NOTE: This sheet is a summary. It may not cover all possible information. If you have questions about this medicine, talk to your doctor, pharmacist, or health care provider.  2023 Elsevier/Gold Standard (2020-10-02 00:00:00)

## 2021-12-15 ENCOUNTER — Ambulatory Visit: Payer: BC Managed Care – PPO | Admitting: Dermatology

## 2021-12-15 DIAGNOSIS — L821 Other seborrheic keratosis: Secondary | ICD-10-CM | POA: Diagnosis not present

## 2021-12-15 DIAGNOSIS — I781 Nevus, non-neoplastic: Secondary | ICD-10-CM

## 2021-12-15 DIAGNOSIS — Z1283 Encounter for screening for malignant neoplasm of skin: Secondary | ICD-10-CM | POA: Diagnosis not present

## 2021-12-16 ENCOUNTER — Encounter: Payer: Self-pay | Admitting: Dermatology

## 2021-12-16 NOTE — Progress Notes (Signed)
   New Patient   Subjective  Lance [name] is a 41 y.o. male who presents for the following: Skin Problem (Patient has lesion on face. X 3 months. It did itch. Also has some moles on shoulder that's getting bigger. No history of skin cancer. ).  Several new growths.  Skin check. Location:  Duration:  Quality:  Associated Signs/Symptoms: Modifying Factors:  Severity:  Timing: Context:    The following portions of the chart were reviewed this encounter and updated as appropriate:  Tobacco  Allergies  Meds  Problems  Med Hx  Surg Hx  Fam Hx      Objective  Well appearing patient in no apparent distress; mood and affect are within normal limits. Waist up skin exam.  Left Malar Cheek, Left Upper Back, Neck - Anterior 2 mm tan slightly textured spot left inner cheek, brown 9 mm flattopped textured papules on left front shoulder and left upper back.  Compatible dermoscopy.  Left Malar Cheek Medial to small keratosis are several small telangiectasia.    All skin waist up examined.   Assessment & Plan  Seborrheic keratosis (3) Neck - Anterior; Left Upper Back; Left Malar Cheek  Leave if stable  Telangiectasia Left Malar Cheek  Recheck as needed clinical change  Screening for malignant neoplasm of skin  Encouraged to self examine twice annually.

## 2021-12-29 ENCOUNTER — Ambulatory Visit: Payer: BC Managed Care – PPO | Admitting: Physician Assistant

## 2022-02-10 ENCOUNTER — Ambulatory Visit (HOSPITAL_COMMUNITY): Payer: BC Managed Care – PPO | Admitting: Physician Assistant

## 2022-04-01 ENCOUNTER — Encounter: Payer: Self-pay | Admitting: Internal Medicine

## 2022-04-01 ENCOUNTER — Ambulatory Visit (INDEPENDENT_AMBULATORY_CARE_PROVIDER_SITE_OTHER): Payer: BC Managed Care – PPO | Admitting: Internal Medicine

## 2022-04-01 VITALS — BP 104/82 | HR 83 | Temp 97.6°F | Ht 74.0 in | Wt 316.6 lb

## 2022-04-01 DIAGNOSIS — Z136 Encounter for screening for cardiovascular disorders: Secondary | ICD-10-CM | POA: Diagnosis not present

## 2022-04-01 DIAGNOSIS — E538 Deficiency of other specified B group vitamins: Secondary | ICD-10-CM

## 2022-04-01 DIAGNOSIS — Z8616 Personal history of COVID-19: Secondary | ICD-10-CM | POA: Insufficient documentation

## 2022-04-01 DIAGNOSIS — Z0000 Encounter for general adult medical examination without abnormal findings: Secondary | ICD-10-CM | POA: Diagnosis not present

## 2022-04-01 DIAGNOSIS — I4819 Other persistent atrial fibrillation: Secondary | ICD-10-CM

## 2022-04-01 DIAGNOSIS — Z23 Encounter for immunization: Secondary | ICD-10-CM

## 2022-04-01 DIAGNOSIS — I4891 Unspecified atrial fibrillation: Secondary | ICD-10-CM

## 2022-04-01 DIAGNOSIS — E559 Vitamin D deficiency, unspecified: Secondary | ICD-10-CM | POA: Diagnosis not present

## 2022-04-01 LAB — CBC WITH DIFFERENTIAL/PLATELET
Basophils Absolute: 0 10*3/uL (ref 0.0–0.1)
Basophils Relative: 0.5 % (ref 0.0–3.0)
Eosinophils Absolute: 0.1 10*3/uL (ref 0.0–0.7)
Eosinophils Relative: 1.7 % (ref 0.0–5.0)
HCT: 42.4 % (ref 39.0–52.0)
Hemoglobin: 14.5 g/dL (ref 13.0–17.0)
Lymphocytes Relative: 26.6 % (ref 12.0–46.0)
Lymphs Abs: 1.9 10*3/uL (ref 0.7–4.0)
MCHC: 34.1 g/dL (ref 30.0–36.0)
MCV: 90.1 fl (ref 78.0–100.0)
Monocytes Absolute: 0.7 10*3/uL (ref 0.1–1.0)
Monocytes Relative: 9.1 % (ref 3.0–12.0)
Neutro Abs: 4.5 10*3/uL (ref 1.4–7.7)
Neutrophils Relative %: 62.1 % (ref 43.0–77.0)
Platelets: 243 10*3/uL (ref 150.0–400.0)
RBC: 4.71 Mil/uL (ref 4.22–5.81)
RDW: 13.6 % (ref 11.5–15.5)
WBC: 7.3 10*3/uL (ref 4.0–10.5)

## 2022-04-01 LAB — URINALYSIS
Bilirubin Urine: NEGATIVE
Hgb urine dipstick: NEGATIVE
Ketones, ur: NEGATIVE
Leukocytes,Ua: NEGATIVE
Nitrite: NEGATIVE
Specific Gravity, Urine: >=1.03 — AB (ref 1.000–1.030)
Total Protein, Urine: NEGATIVE
Urine Glucose: NEGATIVE
Urobilinogen, UA: 0.2 (ref 0.0–1.0)
pH: 5.5 (ref 5.0–8.0)

## 2022-04-01 LAB — LIPID PANEL
Cholesterol: 121 mg/dL (ref 0–200)
HDL: 35.9 mg/dL — ABNORMAL LOW (ref >39.00)
LDL Cholesterol: 68 mg/dL (ref 0–99)
NonHDL: 84.67
Total CHOL/HDL Ratio: 3
Triglycerides: 85 mg/dL (ref 0.0–149.0)
VLDL: 17 mg/dL (ref 0.0–40.0)

## 2022-04-01 LAB — COMPREHENSIVE METABOLIC PANEL
ALT: 21 U/L (ref 0–53)
AST: 20 U/L (ref 0–37)
Albumin: 4.5 g/dL (ref 3.5–5.2)
Alkaline Phosphatase: 48 U/L (ref 39–117)
BUN: 14 mg/dL (ref 6–23)
CO2: 28 mEq/L (ref 19–32)
Calcium: 9.4 mg/dL (ref 8.4–10.5)
Chloride: 104 mEq/L (ref 96–112)
Creatinine, Ser: 0.97 mg/dL (ref 0.40–1.50)
GFR: 96.03 mL/min (ref >60.00)
Glucose, Bld: 82 mg/dL (ref 70–99)
Potassium: 4.1 mEq/L (ref 3.5–5.1)
Sodium: 139 mEq/L (ref 135–145)
Total Bilirubin: 0.6 mg/dL (ref 0.2–1.2)
Total Protein: 7.2 g/dL (ref 6.0–8.3)

## 2022-04-01 LAB — VITAMIN D 25 HYDROXY (VIT D DEFICIENCY, FRACTURES): VITD: 28.24 ng/mL — ABNORMAL LOW (ref 30.00–100.00)

## 2022-04-01 LAB — PSA: PSA: 0.48 ng/mL (ref 0.10–4.00)

## 2022-04-01 LAB — TSH: TSH: 1.59 u[IU]/mL (ref 0.35–5.50)

## 2022-04-01 LAB — VITAMIN B12: Vitamin B-12: 1000 pg/mL — ABNORMAL HIGH (ref 211–911)

## 2022-04-01 NOTE — Patient Instructions (Signed)
[place]

## 2022-04-01 NOTE — Assessment & Plan Note (Signed)
We discussed age appropriate health related issues, including available/recomended screening tests and vaccinations. Labs were ordered to be later reviewed . All questions were answered. We discussed one or more of the following - seat belt use, use of sunscreen/sun exposure exercise, safe sex, fall risk reduction, second hand smoke exposure, firearm use and storage, seat belt use, a need for adhering to healthy diet and exercise. Labs were ordered.  All questions were answered. Take Vit B12 and Vit D Loose wt

## 2022-04-01 NOTE — Assessment & Plan Note (Signed)
Continue on Rx

## 2022-04-01 NOTE — Progress Notes (Signed)
Subjective:  Patient ID: Lance [name], male    DOB: 1980-11-12  Age: 41 y.o. MRN: [id number]  CC: Annual Exam (Flu shot)   HPI Lance [name] presents for a well exam  Outpatient Medications Prior to Visit  Medication Sig Dispense Refill   acetaminophen (TYLENOL) 500 MG tablet Take 1,000 mg by mouth every 6 (six) hours as needed for headache.     B Complex-Folic Acid (B COMPLEX PLUS) TABS Take 1 tablet by mouth daily. 100 tablet 3   busPIRone (BUSPAR) 10 MG tablet Take 0.5-1 tablets (5-10 mg total) by mouth 2 (two) times daily. 180 tablet 1   fexofenadine (ALLEGRA) 30 MG tablet Take 30 mg by mouth at bedtime as needed.      losartan (COZAAR) 25 MG tablet Take 1 tablet (25 mg total) by mouth daily. Last refill without office visit please call [phone] to schedule 30 tablet 0   metoprolol succinate (TOPROL-XL) 50 MG 24 hr tablet Take 1 tablet (50 mg total) by mouth in the morning and at bedtime. Take with or immediately following a meal. 180 tablet 1   propafenone (RYTHMOL SR) 325 MG 12 hr capsule Take 1 capsule (325 mg total) by mouth 2 (two) times daily. 60 capsule 3   Cholecalciferol (VITAMIN D3) 50 MCG (2000 UT) capsule Take 1 capsule (2,000 Units total) by mouth daily. 100 capsule 3   rivaroxaban (XARELTO) 20 MG TABS tablet Take 1 tablet (20 mg total) by mouth daily with supper. Last refill without office visit please call [phone] to schedule 30 tablet 0   No facility-administered medications prior to visit.    ROS: Review of Systems  Constitutional:  Negative for appetite change, fatigue and unexpected weight change.  HENT:  Negative for congestion, nosebleeds, sneezing, sore throat and trouble swallowing.   Eyes:  Negative for itching and visual disturbance.  Respiratory:  Negative for cough.   Cardiovascular:  Negative for chest pain, palpitations and leg swelling.  Gastrointestinal:  Negative for abdominal distention, blood in stool, diarrhea and nausea.   Genitourinary:  Negative for frequency and hematuria.  Musculoskeletal:  Negative for back pain, gait problem, joint swelling and neck pain.  Skin:  Negative for rash.  Neurological:  Negative for dizziness, tremors, speech difficulty and weakness.  Psychiatric/Behavioral:  Negative for agitation, dysphoric mood, sleep disturbance and suicidal ideas. The patient is not nervous/anxious.     Objective:  BP 104/82 (BP Location: Left Arm)   Pulse 83   Temp 97.6 F (36.4 C) (Oral)   Ht 6\' 2"  (1.88 m)   Wt (!) 316 lb 9.6 oz (143.6 kg)   SpO2 96%   BMI 40.65 kg/m   BP Readings from Last 3 Encounters:  04/01/22 104/82  11/25/21 114/68  10/16/21 118/70    Wt Readings from Last 3 Encounters:  04/01/22 (!) 316 lb 9.6 oz (143.6 kg)  11/25/21 (!) 309 lb 12.8 oz (140.5 kg)  10/16/21 (!) 323 lb 3.2 oz (146.6 kg)    Physical Exam Constitutional:      General: He is not in acute distress.    Appearance: He is well-developed. He is obese.     Comments: NAD  Eyes:     Conjunctiva/sclera: Conjunctivae normal.     Pupils: Pupils are equal, round, and reactive to light.  Neck:     Thyroid: No thyromegaly.     Vascular: No JVD.  Cardiovascular:     Rate and Rhythm: Normal rate and regular rhythm.  Heart sounds: Normal heart sounds. No murmur heard.    No friction rub. No gallop.  Pulmonary:     Effort: Pulmonary effort is normal. No respiratory distress.     Breath sounds: Normal breath sounds. No wheezing or rales.  Chest:     Chest wall: No tenderness.  Abdominal:     General: Bowel sounds are normal. There is no distension.     Palpations: Abdomen is soft. There is no mass.     Tenderness: There is no abdominal tenderness. There is no guarding or rebound.  Musculoskeletal:        General: No tenderness. Normal range of motion.     Cervical back: Normal range of motion.  Lymphadenopathy:     Cervical: No cervical adenopathy.  Skin:    General: Skin is warm and dry.      Findings: No rash.  Neurological:     Mental Status: He is alert and oriented to person, place, and time.     Cranial Nerves: No cranial nerve deficit.     Motor: No abnormal muscle tone.     Coordination: Coordination normal.     Gait: Gait normal.     Deep Tendon Reflexes: Reflexes are normal and symmetric.  Psychiatric:        Behavior: Behavior normal.        Thought Content: Thought content normal.        Judgment: Judgment normal.     Lab Results  Component Value Date   WBC 7.3 04/01/2022   HGB 14.5 04/01/2022   HCT 42.4 04/01/2022   PLT 243.0 04/01/2022   GLUCOSE 82 04/01/2022   CHOL 121 04/01/2022   TRIG 85.0 04/01/2022   HDL 35.90 (L) 04/01/2022   LDLCALC 68 04/01/2022   ALT 21 04/01/2022   AST 20 04/01/2022   NA 139 04/01/2022   K 4.1 04/01/2022   CL 104 04/01/2022   CREATININE 0.97 04/01/2022   BUN 14 04/01/2022   CO2 28 04/01/2022   TSH 1.59 04/01/2022   PSA 0.48 04/01/2022   INR 1.07 01/01/2016    No results found.  Assessment & Plan:   Problem List Items Addressed This Visit     Atrial fibrillation with rapid ventricular response (HCC)    Dr Johney Frame Amiodarone       History of COVID-19   Persistent atrial fibrillation (HCC)    Continue on Rx      Well adult exam - Primary    We discussed age appropriate health related issues, including available/recomended screening tests and vaccinations. Labs were ordered to be later reviewed . All questions were answered. We discussed one or more of the following - seat belt use, use of sunscreen/sun exposure exercise, safe sex, fall risk reduction, second hand smoke exposure, firearm use and storage, seat belt use, a need for adhering to healthy diet and exercise. Labs were ordered.  All questions were answered. Take Vit B12 and Vit D Loose wt      Relevant Orders   TSH (Completed)   Urinalysis (Completed)   CBC with Differential/Platelet (Completed)   Lipid panel (Completed)   PSA (Completed)    Comprehensive metabolic panel (Completed)   Vitamin B12 (Completed)   VITAMIN D 25 Hydroxy (Vit-D Deficiency, Fractures) (Completed)   Other Visit Diagnoses     Vitamin D deficiency       Relevant Orders   VITAMIN D 25 Hydroxy (Vit-D Deficiency, Fractures) (Completed)   Low serum vitamin B12  Relevant Orders   Vitamin B12 (Completed)         No orders of the defined types were placed in this encounter.     Follow-up: Return in about 1 year (around 04/02/2023) for Wellness Exam.  Walker Kehr, MD

## 2022-04-01 NOTE — Assessment & Plan Note (Signed)
Dr Rayann Heman Amiodarone

## 2022-04-02 ENCOUNTER — Other Ambulatory Visit: Payer: Self-pay | Admitting: Internal Medicine

## 2022-04-02 MED ORDER — VITAMIN D3 50 MCG (2000 UT) PO CAPS: 4000.0000 [IU] | ORAL_CAPSULE | Freq: Every day | ORAL | 3 refills | Status: AC

## 2022-04-03 ENCOUNTER — Other Ambulatory Visit: Payer: Self-pay | Admitting: Internal Medicine

## 2022-04-03 NOTE — Telephone Encounter (Signed)
Prescription refill request for Xarelto received.   Indication: Afib  Last office visit: 11/25/2021, [place] Weight:143.6 kg Age: 41 yo  Scr: 0.97, 04/01/2022 CrCl: 202 ml/min   Refill sent.

## 2022-04-28 ENCOUNTER — Ambulatory Visit: Payer: Self-pay | Admitting: Licensed Clinical Social Worker

## 2022-04-28 NOTE — Patient Outreach (Signed)
  Care Coordination   04/28/2022 Name: Lance [name] MRN: [id number] DOB: [birth year]   Care Coordination Outreach Attempts:  An unsuccessful telephone outreach was attempted today to offer the patient information about available care coordination services as a benefit of their health plan.   Follow Up Plan:  Additional outreach attempts will be made to offer the patient care coordination information and services.   Encounter Outcome:  No Answer  Care Coordination Interventions Activated:  No   Care Coordination Interventions:  No, not indicated    Kelton Pillar.Arush Gatliff MSW, LCSW Licensed Visual merchandiser [facility] Care Management [phone]

## 2022-05-26 ENCOUNTER — Other Ambulatory Visit: Payer: Self-pay | Admitting: Cardiology

## 2022-06-11 ENCOUNTER — Other Ambulatory Visit: Payer: Self-pay | Admitting: Internal Medicine

## 2022-09-10 ENCOUNTER — Other Ambulatory Visit: Payer: Self-pay | Admitting: Cardiology

## 2022-10-13 ENCOUNTER — Other Ambulatory Visit: Payer: Self-pay

## 2022-10-13 MED ORDER — RIVAROXABAN 20 MG PO TABS: ORAL_TABLET | ORAL | 0 refills | Status: DC

## 2022-11-09 ENCOUNTER — Other Ambulatory Visit: Payer: Self-pay | Admitting: Cardiology

## 2022-11-09 NOTE — Telephone Encounter (Signed)
Prescription refill request for Xarelto received.  Indication: AF Last office visit: 11/25/21 Weight: 140.5kg Age: 42 Scr: 0.97 on 04/01/22  Epic CrCl: 195.14  Based on above findings Xarelto 20mg  daily is the appropriate dose.  Past is due for appt with Dr Elberta Fortis.  Message sent to schedulers. Refill approved x 1 only.

## 2022-11-12 NOTE — Progress Notes (Addendum)
Cardiology Office Note Date:  11/13/2022  Patient ID:  Lance, [name] September 05, 1980, MRN [id number] PCP:  Tresa Garter, MD  Cardiologist:  Dr. [name] (2021) Electrophysiologist: Dr. Elberta Fortis    Chief Complaint:  Annual visit  History of Present Illness: Lance [name] is a 42 y.o. 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 He became hypotensive requiring Levophed and IV fluids. He underwent cardioversion. He was then started on dofetilide but had polymorphic VT. He was transitioned to oral amiodarone with good response. Echo showed an ejection fraction of 10% with diffuse hypokinesis. He is status post atrial flutter ablation in 2014. Ejection fraction normalized. He was readmitted July 2017 with heart failure was found to have atrial fibrillation. He underwent repeat cardioversion and was restarted on amiodarone. He had atrial fibrillation ablation 03/30/2019  He saw Dr. Elberta Fortis 11/25/21 unfortunately with recurrent AFib with RVR, flecainide stopped, toprol increased and started on propafenone   TODAY He feels well No cardiac awareness, palpitations. He reports that since his last ablation he has been asymptomatic when in Afib (previously had made him feel lightheaded, had palpitations) He is active plays softball on Thursdays, walks for exercise as well No difficulties with ADLs No dizzy spells, near syncope or syncope. No bleeding or signs of bleeding     AFib/AAD hx Failed tikosyn with PMVT 2014 CTI ablation 09/14/2012 (Dr. [name]) PVI CTI ablation 02/15/2014 (Dr. Johney Frame) Flecainide started 2018 failed to maintain SR stopped 2018 PVI ablation 03/30/2019 (Dr. Johney Frame) Amiodarone (unclear start) stopped post ablation 2020 Flecainide re-tried Oct 2022 Flecainide stopped June 2023 with recurrent Afib Propafenone started June 2023  Past  Medical History:  Diagnosis Date   Acute systolic heart failure (HCC) 09/14/2012   Anxiety 03/27/2020   Atrial flutter (HCC)    s/p ablation by Dr [name]   Medical history non-contributory    Neuromuscular disorder (HCC)    Onychomycosis    OSA on CPAP    Peripheral vascular disease (HCC)    Persistent atrial fibrillation (HCC) 2003    Past Surgical History:  Procedure Laterality Date   ATRIAL FIBRILLATION ABLATION N/A 02/15/2014   Procedure: ATRIAL FIBRILLATION ABLATION;  Surgeon: Gardiner Rhyme, MD;  Location: [facility] CATH LAB;  Service: Cardiovascular;  Laterality: N/A;   ATRIAL FIBRILLATION ABLATION N/A 03/30/2019   Procedure: ATRIAL FIBRILLATION ABLATION;  Surgeon: Hillis Range, MD;  Location: [facility] INVASIVE CV LAB;  Service: Cardiovascular;  Laterality: N/A;   ATRIAL FLUTTER ABLATION  09/14/2012   by Dr [name]   ATRIAL FLUTTER ABLATION N/A 09/14/2012   Procedure: ATRIAL FLUTTER ABLATION;  Surgeon: Marinus Maw, MD;  Location[facility] CATH LAB;  Service: Cardiovascular;  Laterality: N/A;   CARDIAC CATHETERIZATION     CARDIOVERSION N/A 08/14/2012   Procedure: CARDIOVERSION;  Surgeon: Dolores Patty, MD;  Location: [facility] OR;  Service: Cardiovascular;  Laterality: N/A;   CARDIOVERSION N/A 01/03/2016   Procedure: CARDIOVERSION;  Surgeon: Pricilla Riffle, MD;  Location: [facility] ENDOSCOPY;  Service: Cardiovascular;  Laterality: N/A;   CARDIOVERSION N/A 03/08/2017   Procedure: CARDIOVERSION;  Surgeon: Dolores Patty, MD;  Location: [facility] ENDOSCOPY;  Service: Cardiovascular;  Laterality: N/A;   MANDIBLE SURGERY     underbite correction   TEE WITHOUT CARDIOVERSION N/A 02/14/2014   Procedure: TRANSESOPHAGEAL ECHOCARDIOGRAM (TEE);  Surgeon: Quintella Reichert, MD;  Location: [facility] ENDOSCOPY;  Service: Cardiovascular;  Laterality: N/A;   TEE WITHOUT CARDIOVERSION N/A 01/03/2016   Procedure: TRANSESOPHAGEAL ECHOCARDIOGRAM (TEE);  Surgeon: Pricilla Riffle, MD;  Location: [facility] ENDOSCOPY;  Service: Cardiovascular;  Laterality:  N/A;    Current Outpatient Medications  Medication Sig Dispense Refill   acetaminophen (TYLENOL) 500 MG tablet Take 1,000 mg by mouth every 6 (six) hours as needed for headache.     B Complex-Folic Acid (B COMPLEX PLUS) TABS Take 1 tablet by mouth daily. 100 tablet 3   busPIRone (BUSPAR) 10 MG tablet TAKE ONE-HALF TO ONE TABLET BY MOUTH TWICE A DAY 180 tablet 1   Cholecalciferol (VITAMIN D3) 50 MCG (2000 UT) capsule Take 2 capsules (4,000 Units total) by mouth daily. 100 capsule 3   fexofenadine (ALLEGRA) 30 MG tablet Take 30 mg by mouth at bedtime as needed.      metoprolol succinate (TOPROL-XL) 50 MG 24 hr tablet Take 1 tablet (50 mg total) by mouth in the morning and at bedtime. Take with or immediately following a meal. 180 tablet 1   losartan (COZAAR) 25 MG tablet Take 1 tablet (25 mg total) by mouth daily. 90 tablet 2   propafenone (RYTHMOL SR) 325 MG 12 hr capsule Take 1 capsule (325 mg total) by mouth 2 (two) times daily. 180 capsule 3   rivaroxaban (XARELTO) 20 MG TABS tablet Take 1 tablet (20 mg total) by mouth daily with supper. 30 tablet 11   No current facility-administered medications for this visit.    Allergies:   Amiodarone, Altace [ramipril], Azithromycin, Protonix [pantoprazole sodium], Fish allergy, and Metoprolol succinate   Social History:  The patient  reports that he has been smoking cigarettes and cigars. He has never used smokeless tobacco. He reports that he does not currently use alcohol. He reports that he does not currently use drugs after having used the following drugs: Marijuana.   Family History:  The patient's family history includes Alcohol abuse in his father; Depression in his father; Diabetes in his mother; Hypertension in his mother; Osteoarthritis in his father; Varicose Veins in his mother.  ROS:  Please see the history of present illness.    All other systems are reviewed and otherwise negative.   PHYSICAL EXAM:  VS:  BP 110/82   Pulse 98   Ht  6\' 2"  (1.88 m)   Wt (!) 325 lb (147.4 kg)   SpO2 98%   BMI 41.73 kg/m  BMI: Body mass index is 41.73 kg/m. Well nourished, well developed, in no acute distress HEENT: normocephalic, atraumatic Neck: no JVD, carotid bruits or masses Cardiac:  irreg-irreg; no significant murmurs, no rubs, or gallops Lungs:  CTA b/l, no wheezing, rhonchi or rales Abd: soft, nontender MS: no deformity or atrophy Ext: trace if any edema, varicosities R>L Skin: warm and dry, no rash Neuro:  No gross deficits appreciated Psych: euthymic mood, full affect   EKG:  Done today and reviewed by myself shows  AFib 108bpm, QRS , QTc  02/15/2020: TTE 1. Left ventricular ejection fraction, by estimation, is 60 to 65%. The  left ventricle has normal function. The left ventricle has no regional  wall motion abnormalities. Left ventricular diastolic parameters are  indeterminate. The average left  ventricular global longitudinal strain is -14.6 %. The global longitudinal  strain is abnormal but there was a PVC that occurred during the cycle on  the apical 2 chamber view and therefore not accurate.   2. Right ventricular systolic function is normal. The right  ventricular  size is normal.   3. The mitral valve is normal in structure. No evidence of mitral valve  regurgitation. No evidence of mitral stenosis.   4. The aortic valve is normal in structure. Aortic valve regurgitation is  not visualized. No aortic stenosis is present.   5. Aortic dilatation noted. There is mild dilatation of the aortic root,  measuring 39 mm.   6. The inferior vena cava is normal in size with greater than 50%  respiratory variability, suggesting right atrial pressure of 3 mmHg.   7. Compared to echo a year ago, this study is unchanged.   03/30/2019: EPS/ablation  1. Atrial fibrillation upon presentation with conversion to sinus rhythm during anesthesia induction.   2. Intracardiac echo reveals a moderate sized left atrium  with four separate pulmonary veins without evidence of pulmonary vein stenosis. 3. Pulmonary veins were found to be isolated from a prior ablation procedure.  Electrical isolation however was quite antral.  Due to prior antrial ablation, I elected to perform additional pulmonary vein encircling today with a WACA approach. 4. Successful electrical isolation and anatomical encircling of all four pulmonary veins with radiofrequency current. 5. No inducible arrhythmias following ablation both on and off of Isuprel 6. CTI block confirmed from a prior ablation 7. No early apparent complications.  02/15/2014: EPS/ablation CONCLUSIONS: 1. Sinus rhythm upon presentation.   2. Rotational Angiography reveals a moderate sized left atrium with four separate pulmonary veins without evidence of pulmonary vein stenosis. 3. Successful electrical isolation and anatomical encircling of all four pulmonary veins with radiofrequency current. 4. No inducible arrhythmias following ablation both on and off of dobutamine 5. Complete CTI block confirmed from a prior ablation procedure 6. No early apparent complications.  Recent Labs: 04/01/2022: ALT 21; BUN 14; Creatinine, Ser 0.97; Hemoglobin 14.5; Platelets 243.0; Potassium 4.1; Sodium 139; TSH 1.59  04/01/2022: Cholesterol 121; HDL 35.90; LDL Cholesterol 68; Total CHOL/HDL Ratio 3; Triglycerides 85.0; VLDL 17.0   CrCl cannot be calculated (Patient's most recent lab result is older than the maximum 21 days allowed.).   Wt Readings from Last 3 Encounters:  11/13/22 (!) 325 lb (147.4 kg)  04/01/22 (!) 316 lb 9.6 oz (143.6 kg)  11/25/21 (!) 309 lb 12.8 oz (140.5 kg)     Other studies reviewed: Additional studies/records reviewed today include: summarized above  ASSESSMENT AND PLAN:  Persistent Afib CHA2DS2Vasc is 2, on Xareto, appropriately dosed propafenone with stable QRS/QTc unclear burden without symptoms   Chronic CHF Tachy-mediated CM with recovery  of his LVEF No symptoms or exam findings to suggest volume OL or clinical change Rhythm control important given his hx    Discussed today my thoughts towards management strategies, particularly given his hx of tachy mediated CM and importance of rhythm control That being said, he has had 2 PVI ablations (3 total ablation procedures) Has tried and failed a number of meds Given young age amiodarone less then desirable  Discussed  monitoring to try and establish if his AFib is paroxysmal, persistent and what his HR control is like, weather or not propafenone is being effective in any way +/- role of DCCV Updating his echo to revisit his LVEF with concerns that he has been in Afib since his last visit Possible consideration of convergent procedure.  He is right now, not agreeable to any of those He does inquire about trying a higher dose of propafenone, I had not had experience with higher dosing and not convinced it would be effective  though would reach out to Dr. Elberta Fortis. He is frustrated about a number of things Mentions difficulty in getting refills approved Burden of visits Having to have a new EP MD, new providers that don't know him, his history  We landed with a plan that he will check his rhythm daily with his Lourena Simmonds and send weekly his tracings to try and establish rate at least (note his HR at his PMD visit in Oct was 72 vy his vitals)  For now, keep propafenone   HTN Looks ok  Secondary hypercoagulable state   Disposition: F/u with [place] in 3 mo, preferable with Dr. Elberta Fortis to revisit strategy and management options, sooner if needed.   Current medicines are reviewed at length with the patient today.  The patient did not have any concerns regarding medicines.  Norma Fredrickson, PA-C 11/13/2022 6:07 PM     [facility] [address] [place] [address] [phone] (office)  [phone] (fax)

## 2022-11-13 ENCOUNTER — Ambulatory Visit: Payer: No Typology Code available for payment source | Attending: Physician Assistant | Admitting: Physician Assistant

## 2022-11-13 ENCOUNTER — Encounter: Payer: Self-pay | Admitting: Physician Assistant

## 2022-11-13 VITALS — BP 110/82 | HR 98 | Ht 74.0 in | Wt 325.0 lb

## 2022-11-13 DIAGNOSIS — D6869 Other thrombophilia: Secondary | ICD-10-CM | POA: Diagnosis not present

## 2022-11-13 DIAGNOSIS — I4819 Other persistent atrial fibrillation: Secondary | ICD-10-CM | POA: Diagnosis not present

## 2022-11-13 DIAGNOSIS — Z79899 Other long term (current) drug therapy: Secondary | ICD-10-CM | POA: Diagnosis not present

## 2022-11-13 DIAGNOSIS — I10 Essential (primary) hypertension: Secondary | ICD-10-CM

## 2022-11-13 MED ORDER — PROPAFENONE HCL ER 325 MG PO CP12: 325.0000 mg | ORAL_CAPSULE | Freq: Two times a day (BID) | ORAL | 3 refills | Status: DC

## 2022-11-13 MED ORDER — LOSARTAN POTASSIUM 25 MG PO TABS: 25.0000 mg | ORAL_TABLET | Freq: Every day | ORAL | 2 refills | Status: DC

## 2022-11-13 MED ORDER — RIVAROXABAN 20 MG PO TABS: 20.0000 mg | ORAL_TABLET | Freq: Every day | ORAL | 11 refills | Status: DC

## 2022-11-13 NOTE — Patient Instructions (Signed)
Medication Instructions:   Your physician recommends that you continue on your current medications as directed. Please refer to the Current Medication list given to you today.  *If you need a refill on your cardiac medications before your next appointment, please call your pharmacy*   Lab Work:  BMET AND CBC TODAY    If you have labs (blood work) drawn today and your tests are completely normal, you will receive your results only by: MyChart Message (if you have MyChart) OR A paper copy in the mail If you have any lab test that is abnormal or we need to change your treatment, we will call you to review the results.   Testing/Procedures:  NONE ORDERED  TODAY      Follow-Up: At [facility], you and your health needs are our priority.  As part of our continuing mission to provide you with exceptional heart care, we have created designated Provider Care Teams.  These Care Teams include your primary Cardiologist (physician) and Advanced Practice Providers (APPs -  Physician Assistants and Nurse Practitioners) who all work together to provide you with the care you need, when you need it.  We recommend signing up for the patient portal called "MyChart".  Sign up information is provided on this After Visit Summary.  MyChart is used to connect with patients for Virtual Visits (Telemedicine).  Patients are able to view lab/test results, encounter notes, upcoming appointments, etc.  Non-urgent messages can be sent to your provider as well.   To learn more about what you can do with MyChart, go to [email or web address].    Your next appointment:    2-3 month(s)  Provider:    You may see Will Jorja Loa, MD or one of the following Advanced Practice Providers on your designated Care Team:    +Other Instructions

## 2022-11-14 LAB — CBC
Hematocrit: 44.3 % (ref 37.5–51.0)
Hemoglobin: 15.3 g/dL (ref 13.0–17.7)
MCH: 31.5 pg (ref 26.6–33.0)
MCHC: 34.5 g/dL (ref 31.5–35.7)
MCV: 91 fL (ref 79–97)
Platelets: 257 10*3/uL (ref 150–450)
RBC: 4.86 x10E6/uL (ref 4.14–5.80)
RDW: 13.1 % (ref 11.6–15.4)
WBC: 7 10*3/uL (ref 3.4–10.8)

## 2022-11-14 LAB — BASIC METABOLIC PANEL
BUN/Creatinine Ratio: 15 (ref 9–20)
BUN: 14 mg/dL (ref 6–24)
CO2: 22 mmol/L (ref 20–29)
Calcium: 9.1 mg/dL (ref 8.7–10.2)
Chloride: 105 mmol/L (ref 96–106)
Creatinine, Ser: 0.96 mg/dL (ref 0.76–1.27)
Glucose: 100 mg/dL — ABNORMAL HIGH (ref 70–99)
Potassium: 4.5 mmol/L (ref 3.5–5.2)
Sodium: 139 mmol/L (ref 134–144)
eGFR: 101 mL/min/{1.73_m2} (ref >59)

## 2022-12-29 ENCOUNTER — Other Ambulatory Visit: Payer: Self-pay | Admitting: Cardiology

## 2023-02-12 ENCOUNTER — Ambulatory Visit: Payer: No Typology Code available for payment source | Admitting: Cardiology

## 2023-03-19 ENCOUNTER — Ambulatory Visit: Payer: No Typology Code available for payment source | Attending: Cardiology | Admitting: Physician Assistant

## 2023-03-19 ENCOUNTER — Ambulatory Visit (INDEPENDENT_AMBULATORY_CARE_PROVIDER_SITE_OTHER): Payer: No Typology Code available for payment source

## 2023-03-19 ENCOUNTER — Encounter: Payer: Self-pay | Admitting: Physician Assistant

## 2023-03-19 VITALS — BP 108/74 | HR 119 | Ht 74.0 in | Wt 329.0 lb

## 2023-03-19 DIAGNOSIS — I4819 Other persistent atrial fibrillation: Secondary | ICD-10-CM | POA: Diagnosis not present

## 2023-03-19 DIAGNOSIS — I428 Other cardiomyopathies: Secondary | ICD-10-CM | POA: Diagnosis not present

## 2023-03-19 DIAGNOSIS — I4811 Longstanding persistent atrial fibrillation: Secondary | ICD-10-CM

## 2023-03-19 DIAGNOSIS — I10 Essential (primary) hypertension: Secondary | ICD-10-CM | POA: Diagnosis not present

## 2023-03-19 DIAGNOSIS — D6869 Other thrombophilia: Secondary | ICD-10-CM

## 2023-03-19 NOTE — Patient Instructions (Addendum)
Medication Instructions:   Your physician recommends that you continue on your current medications as directed. Please refer to the Current Medication list given to you today.   *If you need a refill on your cardiac medications before your next appointment, please call your pharmacy*   Lab Work: NONE ORDERED  TODAY    If you have labs (blood work) drawn today and your tests are completely normal, you will receive your results only by: MyChart Message (if you have MyChart) OR A paper copy in the mail If you have any lab test that is abnormal or we need to change your treatment, we will call you to review the results.   Testing/Procedures: Your physician has requested that you have an echocardiogram. Echocardiography is a painless test that uses sound waves to create images of your heart. It provides your doctor with information about the size and shape of your heart and how well your heart's chambers and valves are working. This procedure takes approximately one hour. There are no restrictions for this procedure. Please do NOT wear cologne, perfume, aftershave, or lotions (deodorant is allowed). Please arrive 15 minutes prior to your appointment time.   Your physician has recommended that you wear an event monitor. Event monitors are medical devices that record the heart's electrical activity. Doctors most often [place] these monitors to diagnose arrhythmias. Arrhythmias are problems with the speed or rhythm of the heartbeat. The monitor is a small, portable device. You can wear one while you do your normal daily activities. This is usually used to diagnose what is causing palpitations/syncope (passing out).   Follow-Up: At [facility], you and your health needs are our priority.  As part of our continuing mission to provide you with exceptional heart care, we have created designated Provider Care Teams.  These Care Teams include your primary Cardiologist (physician) and Advanced  Practice Providers (APPs -  Physician Assistants and Nurse Practitioners) who all work together to provide you with the care you need, when you need it.  We recommend signing up for the patient portal called "MyChart".  Sign up information is provided on this After Visit Summary.  MyChart is used to connect with patients for Virtual Visits (Telemedicine).  Patients are able to view lab/test results, encounter notes, upcoming appointments, etc.  Non-urgent messages can be sent to your provider as well.   To learn more about what you can do with MyChart, go to [email or web address].    Your next appointment:   2 month(s)  Provider:   You may see Will Jorja Loa, MD   ONLY !! PLEASE!!   Other Instructions  ZIO XT- Long Term Monitor Instructions  Your physician has requested you wear a ZIO patch monitor for 3 days.  This is a single patch monitor. [employer] supplies one patch monitor per enrollment. Additional stickers are not available. Please do not apply patch if you will be having a Nuclear Stress Test,  Echocardiogram, Cardiac CT, MRI, or Chest Xray during the period you would be wearing the  monitor. The patch cannot be worn during these tests. You cannot remove and re-apply the  ZIO XT patch monitor.  Your ZIO patch monitor will be mailed 3 day USPS to your address on file. It may take 3-5 days  to receive your monitor after you have been enrolled.  Once you have received your monitor, please review the enclosed instructions. Your monitor  has already been registered assigning a specific monitor serial # to you.  Billing and Patient Assistance Program Information  We have supplied [employer] with any of your insurance information on file for billing purposes. [employer] offers a sliding scale Patient Assistance Program for patients that do not have  insurance, or whose insurance does not completely cover the cost of the ZIO monitor.  You must apply for the Patient Assistance Program  to qualify for this discounted rate.  To apply, please call [employer] at [phone], select option 4, select option 2, ask to apply for  Patient Assistance Program. Meredeth Ide will ask your household income, and how many people  are in your household. They will quote your out-of-pocket cost based on that information.  [employer] will also be able to set up a 77-month, interest-free payment plan if needed.  Applying the monitor   Shave hair from upper left chest.  Hold abrader disc by orange tab. Rub abrader in 40 strokes over the upper left chest as  indicated in your monitor instructions.  Clean area with 4 enclosed alcohol pads. Let dry.  Apply patch as indicated in monitor instructions. Patch will be placed under collarbone on left  side of chest with arrow pointing upward.  Rub patch adhesive wings for 2 minutes. Remove white label marked "1". Remove the white  label marked "2". Rub patch adhesive wings for 2 additional minutes.  While looking in a mirror, press and release button in center of patch. A small green light will  flash 3-4 times. This will be your only indicator that the monitor has been turned on.  Do not shower for the first 24 hours. You may shower after the first 24 hours.  Press the button if you feel a symptom. You will hear a small click. Record Date, Time and  Symptom in the Patient Logbook.  When you are ready to remove the patch, follow instructions on the last 2 pages of Patient  Logbook. Stick patch monitor onto the last page of Patient Logbook.  Place Patient Logbook in the blue and white box. Use locking tab on box and tape box closed  securely. The blue and white box has prepaid postage on it. Please place it in the mailbox as  soon as possible. Your physician should have your test results approximately 7 days after the  monitor has been mailed back to [facility].  Call [facility] Customer Care at [phone] if you have questions regarding  your ZIO XT  patch monitor. Call them immediately if you see an orange light blinking on your  monitor.  If your monitor falls off in less than 4 days, contact our Monitor department at [phone].  If your monitor becomes loose or falls off after 4 days call [employer] at [phone] for  suggestions on securing your monitor

## 2023-03-19 NOTE — Progress Notes (Unsigned)
Enrolled patient for a 3 day Zio XT monitor to be mailed to patients home   [place] to read

## 2023-03-19 NOTE — Progress Notes (Signed)
Cardiology Office Note Date:  03/19/2023  Patient ID:  Lance [name], Lance [name] 11/15/1980, MRN [id number] PCP:  Tresa Garter, MD  Cardiologist:  Dr. [name] [phone]) Electrophysiologist: Dr. Elberta Fortis    Chief Complaint:   3 mo  History of Present Illness: Lance [name] is a 42 y.o. 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 He became hypotensive requiring Levophed and IV fluids. He underwent cardioversion. He was then started on dofetilide but had polymorphic VT. He was transitioned to oral amiodarone with good response. Echo showed an ejection fraction of 10% with diffuse hypokinesis. He is status post atrial flutter ablation in 2014. Ejection fraction normalized. He was readmitted July 2017 with heart failure was found to have atrial fibrillation. He underwent repeat cardioversion and was restarted on amiodarone. He had atrial fibrillation ablation 03/30/2019  He saw Dr. Elberta Fortis 11/25/21 unfortunately with recurrent AFib with RVR, flecainide stopped, toprol increased and started on propafenone   I saw him 11/13/22 He feels well No cardiac awareness, palpitations. He reports that since his last ablation he has been asymptomatic when in Afib (previously had made him feel lightheaded, had palpitations) He is active plays softball on Thursdays, walks for exercise as well No difficulties with ADLs No dizzy spells, near syncope or syncope. No bleeding or signs of bleeding   He was in Afib, unaware/asymptomatic Discussed  monitoring to try and establish if his AFib is paroxysmal, persistent and what his HR control is like, weather or not propafenone is being effective in any way +/- role of DCCV Updating his echo to revisit his LVEF with concerns that he has been in Afib since his last visit Possible consideration of convergent procedure.   He is  right now, not agreeable to any of those He does inquire about trying a higher dose of propafenone, I had not had experience with higher dosing and not convinced it would be effective though would reach out to Dr. Elberta Fortis. He is frustrated about a number of things Mentions difficulty in getting refills approved Burden of visits Having to have a new EP MD, new providers that don't know him, his history   We landed with a plan that he would continue propafenone, he will check his rhythm daily with his Lourena Simmonds and send weekly his tracings to try and establish rate at least (note his HR at his PMD visit in Oct was 70 by his vitals)  He was to have been scheduled to see Dr. Elberta Fortis given his long AF hx procedure/AADs to establish plan, though placed on my schedule.  TODAY By his HR and kardia, he has been in AFib since his last visit, though asymptomatic He denies any palpitations or cardiac awareness, if not for checking his pulse, rhythm, he would not perhaps know. If anything, perhaps a little less exertional capacity though no difficulties with his ADLs, plays softball. No near syncope or syncope. No SOB No bleeding or signs of bleeding    AFib/AAD hx Failed tikosyn with PMVT 2014 CTI ablation 09/14/2012 (Dr. [name]) PVI CTI ablation 02/15/2014 (Dr. Johney Frame) Flecainide started 2018 failed to maintain SR stopped 2018 PVI ablation 03/30/2019 (Dr. Johney Frame) Amiodarone (unclear start) stopped post ablation 2020 Flecainide re-tried Oct 2022 Flecainide stopped June 2023 with recurrent Afib Propafenone started June 2023 >> current  Past Medical History:  Diagnosis Date   Acute  systolic heart failure (HCC) 09/14/2012   Anxiety 03/27/2020   Atrial flutter (HCC)    s/p ablation by Dr [name]   Medical history non-contributory    Neuromuscular disorder (HCC)    Onychomycosis    OSA on CPAP    Peripheral vascular disease (HCC)    Persistent atrial fibrillation (HCC) 2003    Past Surgical  History:  Procedure Laterality Date   ATRIAL FIBRILLATION ABLATION N/A 02/15/2014   Procedure: ATRIAL FIBRILLATION ABLATION;  Surgeon: Gardiner Rhyme, MD;  Location: [facility] CATH LAB;  Service: Cardiovascular;  Laterality: N/A;   ATRIAL FIBRILLATION ABLATION N/A 03/30/2019   Procedure: ATRIAL FIBRILLATION ABLATION;  Surgeon: Hillis Range, MD;  Location: [facility] INVASIVE CV LAB;  Service: Cardiovascular;  Laterality: N/A;   ATRIAL FLUTTER ABLATION  09/14/2012   by Dr [name]   ATRIAL FLUTTER ABLATION N/A 09/14/2012   Procedure: ATRIAL FLUTTER ABLATION;  Surgeon: Marinus Maw, MD;  Location: [facility] CATH LAB;  Service: Cardiovascular;  Laterality: N/A;   CARDIAC CATHETERIZATION     CARDIOVERSION N/A 08/14/2012   Procedure: CARDIOVERSION;  Surgeon: Dolores Patty, MD;  Location: [facility] OR;  Service: Cardiovascular;  Laterality: N/A;   CARDIOVERSION N/A 01/03/2016   Procedure: CARDIOVERSION;  Surgeon: Pricilla Riffle, MD;  Location: [facility] ENDOSCOPY;  Service: Cardiovascular;  Laterality: N/A;   CARDIOVERSION N/A 03/08/2017   Procedure: CARDIOVERSION;  Surgeon: Dolores Patty, MD;  Location: [facility] ENDOSCOPY;  Service: Cardiovascular;  Laterality: N/A;   MANDIBLE SURGERY     underbite correction   TEE WITHOUT CARDIOVERSION N/A 02/14/2014   Procedure: TRANSESOPHAGEAL ECHOCARDIOGRAM (TEE);  Surgeon: Quintella Reichert, MD;  Location: [facility] ENDOSCOPY;  Service: Cardiovascular;  Laterality: N/A;   TEE WITHOUT CARDIOVERSION N/A 01/03/2016   Procedure: TRANSESOPHAGEAL ECHOCARDIOGRAM (TEE);  Surgeon: Pricilla Riffle, MD;  Location: [facility] ENDOSCOPY;  Service: Cardiovascular;  Laterality: N/A;    Current Outpatient Medications  Medication Sig Dispense Refill   acetaminophen (TYLENOL) 500 MG tablet Take 1,000 mg by mouth every 6 (six) hours as needed for headache.     B Complex-Folic Acid (B COMPLEX PLUS) TABS Take 1 tablet by mouth daily. 100 tablet 3   busPIRone (BUSPAR) 10 MG tablet TAKE ONE-HALF TO ONE TABLET BY MOUTH TWICE A DAY 180  tablet 1   Cholecalciferol (VITAMIN D3) 50 MCG (2000 UT) capsule Take 2 capsules (4,000 Units total) by mouth daily. 100 capsule 3   fexofenadine (ALLEGRA) 30 MG tablet Take 30 mg by mouth at bedtime as needed.      losartan (COZAAR) 25 MG tablet Take 1 tablet (25 mg total) by mouth daily. 90 tablet 2   metoprolol succinate (TOPROL-XL) 50 MG 24 hr tablet take 1 tablet by mouth every morning and daily at bedtime take with or immediately following a meal 180 tablet 2   propafenone (RYTHMOL SR) 325 MG 12 hr capsule Take 1 capsule (325 mg total) by mouth 2 (two) times daily. 180 capsule 3   rivaroxaban (XARELTO) 20 MG TABS tablet Take 1 tablet (20 mg total) by mouth daily with supper. 30 tablet 11   No current facility-administered medications for this visit.    Allergies:   Amiodarone, Altace [ramipril], Azithromycin, Protonix [pantoprazole sodium], Fish allergy, and Metoprolol succinate   Social History:  The patient  reports that he has been smoking cigarettes and cigars. He started smoking about 16 years ago. He has never used smokeless tobacco. He reports that he does not currently use alcohol. He reports that he  does not currently use drugs after having used the following drugs: Marijuana.   Family History:  The patient's family history includes Alcohol abuse in his father; Depression in his father; Diabetes in his mother; Hypertension in his mother; Osteoarthritis in his father; Varicose Veins in his mother.  ROS:  Please see the history of present illness.    All other systems are reviewed and otherwise negative.   PHYSICAL EXAM:  VS:  There were no vitals taken for this visit. BMI: There is no height or weight on file to calculate BMI. Well nourished, well developed, in no acute distress HEENT: normocephalic, atraumatic Neck: no JVD, carotid bruits or masses Cardiac:  irreg-irreg; no significant murmurs, no rubs, or gallops Lungs: CTA b/l, no wheezing, rhonchi or rales Abd: soft,  nontender MS: no deformity or atrophy Ext: trace if any edema, varicosities R>L Skin: warm and dry, no rash Neuro:  No gross deficits appreciated Psych: euthymic mood, full affect   EKG:  Done today and reviewed by myself shows              AFib 119bpm, QRS , QTc , LAD 11/13/22: AFib 108bpm, QRS , QTc  02/15/2020: TTE 1. Left ventricular ejection fraction, by estimation, is 60 to 65%. The  left ventricle has normal function. The left ventricle has no regional  wall motion abnormalities. Left ventricular diastolic parameters are  indeterminate. The average left  ventricular global longitudinal strain is -14.6 %. The global longitudinal  strain is abnormal but there was a PVC that occurred during the cycle on  the apical 2 chamber view and therefore not accurate.   2. Right ventricular systolic function is normal. The right ventricular  size is normal.   3. The mitral valve is normal in structure. No evidence of mitral valve  regurgitation. No evidence of mitral stenosis.   4. The aortic valve is normal in structure. Aortic valve regurgitation is  not visualized. No aortic stenosis is present.   5. Aortic dilatation noted. There is mild dilatation of the aortic root,  measuring 39 mm.   6. The inferior vena cava is normal in size with greater than 50%  respiratory variability, suggesting right atrial pressure of 3 mmHg.   7. Compared to echo a year ago, this study is unchanged.   03/30/2019: EPS/ablation  1. Atrial fibrillation upon presentation with conversion to sinus rhythm during anesthesia induction.   2. Intracardiac echo reveals a moderate sized left atrium with four separate pulmonary veins without evidence of pulmonary vein stenosis. 3. Pulmonary veins were found to be isolated from a prior ablation procedure.  Electrical isolation however was quite antral.  Due to prior antrial ablation, I elected to perform additional pulmonary vein encircling today with a  WACA approach. 4. Successful electrical isolation and anatomical encircling of all four pulmonary veins with radiofrequency current. 5. No inducible arrhythmias following ablation both on and off of Isuprel 6. CTI block confirmed from a prior ablation 7. No early apparent complications.  02/15/2014: EPS/ablation CONCLUSIONS: 1. Sinus rhythm upon presentation.   2. Rotational Angiography reveals a moderate sized left atrium with four separate pulmonary veins without evidence of pulmonary vein stenosis. 3. Successful electrical isolation and anatomical encircling of all four pulmonary veins with radiofrequency current. 4. No inducible arrhythmias following ablation both on and off of dobutamine 5. Complete CTI block confirmed from a prior ablation procedure 6. No early apparent complications.  Recent Labs: 04/01/2022: ALT 21; TSH 1.59 11/13/2022: BUN 14;  Creatinine, Ser 0.96; Hemoglobin 15.3; Platelets 257; Potassium 4.5; Sodium 139  04/01/2022: Cholesterol 121; HDL 35.90; LDL Cholesterol 68; Total CHOL/HDL Ratio 3; Triglycerides 85.0; VLDL 17.0   CrCl cannot be calculated (Patient's most recent lab result is older than the maximum 21 days allowed.).   Wt Readings from Last 3 Encounters:  11/13/22 (!) 325 lb (147.4 kg)  04/01/22 (!) 316 lb 9.6 oz (143.6 kg)  11/25/21 (!) 309 lb 12.8 oz (140.5 kg)     Other studies reviewed: Additional studies/records reviewed today include: summarized above  ASSESSMENT AND PLAN:  Persistent Afib CHA2DS2Vasc is 2, on Xareto, appropriately dosed propafenone with toprol stable intervals   Likely longstanding persistent. Discussed perhaps convergent procedure, or MAZE though he really does not want to consider invasive/procedural strategy.  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 Elberta Fortis in 3 mo, though once I have his monitor/echo back, will review with MD and plan a strategy    Current medicines are reviewed at length with the patient today.  The patient did not have any concerns regarding medicines.  Norma Fredrickson, PA-C 03/19/2023 7:46 AM     [facility] [address] [place] [address] [phone] (office)  [phone] (fax)

## 2023-03-29 DIAGNOSIS — I4819 Other persistent atrial fibrillation: Secondary | ICD-10-CM

## 2023-04-05 ENCOUNTER — Ambulatory Visit (INDEPENDENT_AMBULATORY_CARE_PROVIDER_SITE_OTHER): Payer: No Typology Code available for payment source | Admitting: Internal Medicine

## 2023-04-05 ENCOUNTER — Other Ambulatory Visit (INDEPENDENT_AMBULATORY_CARE_PROVIDER_SITE_OTHER): Payer: No Typology Code available for payment source

## 2023-04-05 ENCOUNTER — Encounter: Payer: Self-pay | Admitting: Internal Medicine

## 2023-04-05 VITALS — BP 98/64 | Temp 98.4°F | Ht 74.0 in | Wt 330.4 lb

## 2023-04-05 DIAGNOSIS — Z0000 Encounter for general adult medical examination without abnormal findings: Secondary | ICD-10-CM

## 2023-04-05 DIAGNOSIS — E538 Deficiency of other specified B group vitamins: Secondary | ICD-10-CM | POA: Diagnosis not present

## 2023-04-05 DIAGNOSIS — R21 Rash and other nonspecific skin eruption: Secondary | ICD-10-CM

## 2023-04-05 DIAGNOSIS — Z23 Encounter for immunization: Secondary | ICD-10-CM | POA: Diagnosis not present

## 2023-04-05 DIAGNOSIS — E559 Vitamin D deficiency, unspecified: Secondary | ICD-10-CM

## 2023-04-05 DIAGNOSIS — I4891 Unspecified atrial fibrillation: Secondary | ICD-10-CM

## 2023-04-05 DIAGNOSIS — R972 Elevated prostate specific antigen [PSA]: Secondary | ICD-10-CM

## 2023-04-05 DIAGNOSIS — I5022 Chronic systolic (congestive) heart failure: Secondary | ICD-10-CM

## 2023-04-05 LAB — CBC WITH DIFFERENTIAL/PLATELET
Basophils Absolute: 0 10*3/uL (ref 0.0–0.1)
Basophils Relative: 0.6 % (ref 0.0–3.0)
Eosinophils Absolute: 0.1 10*3/uL (ref 0.0–0.7)
Eosinophils Relative: 1.5 % (ref 0.0–5.0)
HCT: 42.2 % (ref 39.0–52.0)
Hemoglobin: 14.2 g/dL (ref 13.0–17.0)
Lymphocytes Relative: 32.2 % (ref 12.0–46.0)
Lymphs Abs: 2.2 10*3/uL (ref 0.7–4.0)
MCHC: 33.7 g/dL (ref 30.0–36.0)
MCV: 92.3 fL (ref 78.0–100.0)
Monocytes Absolute: 0.6 10*3/uL (ref 0.1–1.0)
Monocytes Relative: 9.1 % (ref 3.0–12.0)
Neutro Abs: 3.9 10*3/uL (ref 1.4–7.7)
Neutrophils Relative %: 56.6 % (ref 43.0–77.0)
Platelets: 248 10*3/uL (ref 150.0–400.0)
RBC: 4.57 Mil/uL (ref 4.22–5.81)
RDW: 13.7 % (ref 11.5–15.5)
WBC: 6.9 10*3/uL (ref 4.0–10.5)

## 2023-04-05 MED ORDER — TRIAMCINOLONE ACETONIDE 0.5 % EX CREA: 1.0000 | TOPICAL_CREAM | Freq: Three times a day (TID) | CUTANEOUS | 1 refills | Status: AC

## 2023-04-05 MED ORDER — BUSPIRONE HCL 10 MG PO TABS: 10.0000 mg | ORAL_TABLET | Freq: Two times a day (BID) | ORAL | 1 refills | Status: DC

## 2023-04-05 NOTE — Patient Instructions (Signed)
Wegovy  Ozempic

## 2023-04-05 NOTE — Assessment & Plan Note (Signed)
New rash after Ziopatch Start Triamcinolone cream

## 2023-04-05 NOTE — Assessment & Plan Note (Signed)
Controlled.  

## 2023-04-05 NOTE — Assessment & Plan Note (Signed)
Dr Rayann Heman Amiodarone

## 2023-04-05 NOTE — Progress Notes (Unsigned)
Subjective:  Patient ID: Lance [name], male    DOB: 18-Oct-1980  Age: 42 y.o. MRN: [id number]  CC: No chief complaint on file.   HPI Lance [name] presents for a well exam C/o rash after Ziopatch   Outpatient Medications Prior to Visit  Medication Sig Dispense Refill   acetaminophen (TYLENOL) 500 MG tablet Take 1,000 mg by mouth every 6 (six) hours as needed for headache.     B Complex-Folic Acid (B COMPLEX PLUS) TABS Take 1 tablet by mouth daily. 100 tablet 3   Cholecalciferol (VITAMIN D3) 50 MCG (2000 UT) capsule Take 2 capsules (4,000 Units total) by mouth daily. 100 capsule 3   fexofenadine (ALLEGRA) 30 MG tablet Take 30 mg by mouth at bedtime as needed.      losartan (COZAAR) 25 MG tablet Take 1 tablet (25 mg total) by mouth daily. 90 tablet 2   metoprolol succinate (TOPROL-XL) 50 MG 24 hr tablet take 1 tablet by mouth every morning and daily at bedtime take with or immediately following a meal 180 tablet 2   propafenone (RYTHMOL SR) 325 MG 12 hr capsule Take 1 capsule (325 mg total) by mouth 2 (two) times daily. 180 capsule 3   rivaroxaban (XARELTO) 20 MG TABS tablet Take 1 tablet (20 mg total) by mouth daily with supper. 30 tablet 11   busPIRone (BUSPAR) 10 MG tablet TAKE ONE-HALF TO ONE TABLET BY MOUTH TWICE A DAY 180 tablet 1   No facility-administered medications prior to visit.    ROS: Review of Systems  Constitutional:  Positive for unexpected weight change. Negative for appetite change and fatigue.  HENT:  Negative for congestion, nosebleeds, sneezing, sore throat and trouble swallowing.   Eyes:  Negative for itching and visual disturbance.  Respiratory:  Negative for cough.   Cardiovascular:  Positive for palpitations. Negative for chest pain and leg swelling.  Gastrointestinal:  Negative for abdominal distention, blood in stool, diarrhea and nausea.  Genitourinary:  Negative for frequency and hematuria.  Musculoskeletal:  Negative for back pain, gait problem,  joint swelling and neck pain.  Skin:  Negative for rash.  Neurological:  Negative for dizziness, tremors, speech difficulty and weakness.  Psychiatric/Behavioral:  Positive for dysphoric mood. Negative for agitation, sleep disturbance and suicidal ideas. The patient is nervous/anxious.     Objective:  BP 98/64   Temp 98.4 F (36.9 C) (Temporal)   Ht 6\' 2"  (1.88 m)   Wt (!) 330 lb 6 oz (149.9 kg)   BMI 42.42 kg/m   BP Readings from Last 3 Encounters:  04/05/23 98/64  03/19/23 108/74  11/13/22 110/82    Wt Readings from Last 3 Encounters:  04/05/23 (!) 330 lb 6 oz (149.9 kg)  03/19/23 (!) 329 lb (149.2 kg)  11/13/22 (!) 325 lb (147.4 kg)    Physical Exam Constitutional:      General: He is not in acute distress.    Appearance: He is well-developed. He is obese.     Comments: NAD  Eyes:     Conjunctiva/sclera: Conjunctivae normal.     Pupils: Pupils are equal, round, and reactive to light.  Neck:     Thyroid: No thyromegaly.     Vascular: No JVD.  Cardiovascular:     Rate and Rhythm: Normal rate and regular rhythm.     Heart sounds: Normal heart sounds. No murmur heard.    No friction rub. No gallop.  Pulmonary:     Effort: Pulmonary effort is normal.  No respiratory distress.     Breath sounds: Normal breath sounds. No wheezing or rales.  Chest:     Chest wall: No tenderness.  Abdominal:     General: Bowel sounds are normal. There is no distension.     Palpations: Abdomen is soft. There is no mass.     Tenderness: There is no abdominal tenderness. There is no guarding or rebound.  Musculoskeletal:        General: No tenderness. Normal range of motion.     Cervical back: Normal range of motion.     Right lower leg: No edema.     Left lower leg: No edema.  Lymphadenopathy:     Cervical: No cervical adenopathy.  Skin:    General: Skin is warm and dry.     Findings: No rash.  Neurological:     Mental Status: He is alert and oriented to person, place, and time.      Cranial Nerves: No cranial nerve deficit.     Motor: No abnormal muscle tone.     Coordination: Coordination normal.     Gait: Gait normal.     Deep Tendon Reflexes: Reflexes are normal and symmetric.  Psychiatric:        Behavior: Behavior normal.        Thought Content: Thought content normal.        Judgment: Judgment normal.     Lab Results  Component Value Date   WBC 7.0 11/13/2022   HGB 15.3 11/13/2022   HCT 44.3 11/13/2022   PLT 257 11/13/2022   GLUCOSE 100 (H) 11/13/2022   CHOL 121 04/01/2022   TRIG 85.0 04/01/2022   HDL 35.90 (L) 04/01/2022   LDLCALC 68 04/01/2022   ALT 21 04/01/2022   AST 20 04/01/2022   NA 139 11/13/2022   K 4.5 11/13/2022   CL 105 11/13/2022   CREATININE 0.96 11/13/2022   BUN 14 11/13/2022   CO2 22 11/13/2022   TSH 1.59 04/01/2022   PSA 0.48 04/01/2022   INR 1.07 01/01/2016    No results found.  Assessment & Plan:   Problem List Items Addressed This Visit     Atrial fibrillation with rapid ventricular response (HCC)    Dr Johney Frame Amiodarone       Rash    New rash after Ziopatch Start Triamcinolone cream      Well adult exam - Primary   Relevant Orders   TSH   Urinalysis   CBC with Differential/Platelet   Lipid panel   PSA   Comprehensive metabolic panel   Chronic systolic heart failure (HCC)    Controlled       Other Visit Diagnoses     Vitamin D deficiency       Relevant Orders   VITAMIN D 25 Hydroxy (Vit-D Deficiency, Fractures)   Low serum vitamin B12       Relevant Orders   Vitamin B12         Meds ordered this encounter  Medications   triamcinolone cream (KENALOG) 0.5 %    Sig: Apply 1 Application topically 3 (three) times daily.    Dispense:  45 g    Refill:  1   busPIRone (BUSPAR) 10 MG tablet    Sig: Take 1 tablet (10 mg total) by mouth 2 (two) times daily. ONE TABLET BY MOUTH TWICE A DAY    Dispense:  180 tablet    Refill:  1      Follow-up: Return in about  1 year (around 04/04/2024)  for a follow-up visit.  Sonda Primes, MD

## 2023-04-06 LAB — COMPREHENSIVE METABOLIC PANEL
ALT: 22 U/L (ref 0–53)
AST: 21 U/L (ref 0–37)
Albumin: 4.5 g/dL (ref 3.5–5.2)
Alkaline Phosphatase: 46 U/L (ref 39–117)
BUN: 11 mg/dL (ref 6–23)
CO2: 18 meq/L — ABNORMAL LOW (ref 19–32)
Calcium: 9.6 mg/dL (ref 8.4–10.5)
Chloride: 107 meq/L (ref 96–112)
Creatinine, Ser: 1.08 mg/dL (ref 0.40–1.50)
GFR: 83.82 mL/min (ref >60.00)
Glucose, Bld: 85 mg/dL (ref 70–99)
Potassium: 4.1 meq/L (ref 3.5–5.1)
Sodium: 147 meq/L — ABNORMAL HIGH (ref 135–145)
Total Bilirubin: 0.5 mg/dL (ref 0.2–1.2)
Total Protein: 7.3 g/dL (ref 6.0–8.3)

## 2023-04-06 LAB — URINALYSIS
Bilirubin Urine: NEGATIVE
Hgb urine dipstick: NEGATIVE
Ketones, ur: NEGATIVE
Leukocytes,Ua: NEGATIVE
Nitrite: NEGATIVE
Specific Gravity, Urine: >=1.03 — AB (ref 1.000–1.030)
Total Protein, Urine: NEGATIVE
Urine Glucose: NEGATIVE
Urobilinogen, UA: 0.2 (ref 0.0–1.0)
pH: 6 (ref 5.0–8.0)

## 2023-04-06 LAB — TSH: TSH: 2.91 u[IU]/mL (ref 0.35–5.50)

## 2023-04-06 LAB — LIPID PANEL
Cholesterol: 127 mg/dL (ref 0–200)
HDL: 35.6 mg/dL — ABNORMAL LOW (ref >39.00)
LDL Cholesterol: 76 mg/dL (ref 0–99)
NonHDL: 91.86
Total CHOL/HDL Ratio: 4
Triglycerides: 80 mg/dL (ref 0.0–149.0)
VLDL: 16 mg/dL (ref 0.0–40.0)

## 2023-04-06 LAB — VITAMIN B12: Vitamin B-12: 1316 pg/mL — ABNORMAL HIGH (ref 211–911)

## 2023-04-06 LAB — VITAMIN D 25 HYDROXY (VIT D DEFICIENCY, FRACTURES): VITD: 42.4 ng/mL (ref 30.00–100.00)

## 2023-04-06 LAB — PSA: PSA: 5.12 ng/mL — ABNORMAL HIGH (ref 0.10–4.00)

## 2023-04-07 ENCOUNTER — Encounter: Payer: Self-pay | Admitting: Internal Medicine

## 2023-04-07 DIAGNOSIS — E538 Deficiency of other specified B group vitamins: Secondary | ICD-10-CM | POA: Insufficient documentation

## 2023-04-07 DIAGNOSIS — E559 Vitamin D deficiency, unspecified: Secondary | ICD-10-CM | POA: Insufficient documentation

## 2023-04-07 DIAGNOSIS — R972 Elevated prostate specific antigen [PSA]: Secondary | ICD-10-CM | POA: Insufficient documentation

## 2023-04-16 ENCOUNTER — Ambulatory Visit (HOSPITAL_COMMUNITY): Payer: No Typology Code available for payment source | Attending: Physician Assistant

## 2023-04-16 DIAGNOSIS — I428 Other cardiomyopathies: Secondary | ICD-10-CM | POA: Insufficient documentation

## 2023-04-16 DIAGNOSIS — I4819 Other persistent atrial fibrillation: Secondary | ICD-10-CM | POA: Insufficient documentation

## 2023-04-16 LAB — ECHOCARDIOGRAM COMPLETE: S' Lateral: 4.5 cm

## 2023-04-16 MED ORDER — PERFLUTREN LIPID MICROSPHERE
1.0000 mL | INTRAVENOUS | Status: AC | PRN
  Administered 2023-04-16: 4 mL via INTRAVENOUS

## 2023-04-19 ENCOUNTER — Telehealth: Payer: Self-pay

## 2023-04-19 NOTE — Telephone Encounter (Signed)
Spoke with patient and advised Francis Dowse would like him to stop propafenone, and continue the higher dose of his Toprol as instructed at 50mg  BID. Patient verifies this is his current dose of Toprol. He agrees to keep his appointment Dr. Elberta Fortis to discuss next strategies and will be on the lookout for a phone call to get him in sooner possibly.

## 2023-04-19 NOTE — Telephone Encounter (Signed)
-----   Message from Sheilah Pigeon sent at 04/19/2023  5:02 PM EST ----- Reviewed with Dr. Elberta Fortis. Stop propafenone, continue the higher dose of his Toprol as instructed at 50mg  BID Keep his appointment with him to discuss next strategies. Will ask the schedulers to see if there is anything sooner then December

## 2023-05-17 ENCOUNTER — Telehealth: Payer: Self-pay | Admitting: Internal Medicine

## 2023-05-17 NOTE — Telephone Encounter (Signed)
Pt called inquiring about medication and wanting to be put back on the medication as needed.  furosemide (LASIX) 40 MG tablet

## 2023-05-18 MED ORDER — FUROSEMIDE 40 MG PO TABS: 40.0000 mg | ORAL_TABLET | Freq: Every day | ORAL | 1 refills | Status: AC | PRN

## 2023-05-18 NOTE — Telephone Encounter (Signed)
Okay.  Done.  Thanks 

## 2023-05-19 ENCOUNTER — Encounter: Payer: Self-pay | Admitting: Cardiology

## 2023-05-19 ENCOUNTER — Ambulatory Visit: Payer: No Typology Code available for payment source | Attending: Cardiology | Admitting: Cardiology

## 2023-05-19 VITALS — BP 104/68 | HR 150 | Ht 74.0 in | Wt 328.6 lb

## 2023-05-19 DIAGNOSIS — D6869 Other thrombophilia: Secondary | ICD-10-CM

## 2023-05-19 DIAGNOSIS — I10 Essential (primary) hypertension: Secondary | ICD-10-CM | POA: Diagnosis not present

## 2023-05-19 DIAGNOSIS — I5022 Chronic systolic (congestive) heart failure: Secondary | ICD-10-CM

## 2023-05-19 DIAGNOSIS — I428 Other cardiomyopathies: Secondary | ICD-10-CM | POA: Diagnosis not present

## 2023-05-19 DIAGNOSIS — I4819 Other persistent atrial fibrillation: Secondary | ICD-10-CM | POA: Diagnosis not present

## 2023-05-19 MED ORDER — METOPROLOL SUCCINATE ER 100 MG PO TB24: 100.0000 mg | ORAL_TABLET | Freq: Two times a day (BID) | ORAL | 6 refills | Status: DC

## 2023-05-19 MED ORDER — AMIODARONE HCL 200 MG PO TABS: ORAL_TABLET | ORAL | 3 refills | Status: DC

## 2023-05-19 NOTE — Progress Notes (Signed)
Electrophysiology Office Note:   Date:  05/19/2023  ID:  Lance [name], DOB 1980/11/17, MRN [id number]  Primary Cardiologist: None Primary Heart Failure: [name], MD Electrophysiologist: Regan Lemming, MD      History of Present Illness:   Lance [name] is a 42 y.o. male with h/o systolic heart failure with recovery of ejection fraction, atrial fibrillation, atrial flutter, OSA, obesity, hypertension seen today for routine electrophysiology followup.   Since last being seen in our clinic the patient reports continued episodes of fatigue and shortness of breath.  He is able to do his daily activities, though he has to do them more slowly.  He has also been having some chest discomfort when he exerts himself, and palpitations.  He wore a cardiac monitor that showed a 100% atrial fibrillation burden, at times with quite rapid rates.  He had a recent echo that showed a reduction in his ejection fraction.  He is quite concerned about ablation, as he has had multiple ablations in the past and is continued to have episodes of atrial fibrillation.  he denies PND, orthopnea, nausea, vomiting, dizziness, syncope, edema, weight gain, or early satiety.   Review of systems complete and found to be negative unless listed in HPI.   EP Information / Studies Reviewed:    EKG is ordered today. Personal review as below.  EKG Interpretation Date/Time:  Wednesday May 19 2023 16:21:56 EST Ventricular Rate:  150 PR Interval:    QRS Duration:  108 QT Interval:  294 QTC Calculation: 464 R Axis:   -39  Text Interpretation: Atrial fibrillation with rapid ventricular response Left axis deviation Incomplete right bundle branch block When compared with ECG of 19-Mar-2023 14:12, No significant change was found Confirmed by Sharad Vaneaton ([id number]) on 05/19/2023 4:27:18 PM     Risk Assessment/Calculations:    CHA2DS2-VASc Score = 2   This indicates a 2.2% annual risk of stroke. The patient's  score is based upon: CHF History: 1 HTN History: 1 Diabetes History: 0 Stroke History: 0 Vascular Disease History: 0 Age Score: 0 Gender Score: 0             Physical Exam:   VS:  BP 104/68 (BP Location: Left Arm, Patient Position: Sitting, Cuff Size: Large)   Pulse (!) 150   Ht 6\' 2"  (1.88 m)   Wt (!) 328 lb 9.6 oz (149.1 kg)   SpO2 97%   BMI 42.19 kg/m    Wt Readings from Last 3 Encounters:  05/19/23 (!) 328 lb 9.6 oz (149.1 kg)  04/05/23 (!) 330 lb 6 oz (149.9 kg)  03/19/23 (!) 329 lb (149.2 kg)     GEN: Well nourished, well developed in no acute distress NECK: No JVD; No carotid bruits CARDIAC: Irregularly irregular rate and rhythm, no murmurs, rubs, gallops RESPIRATORY:  Clear to auscultation without rales, wheezing or rhonchi  ABDOMEN: Soft, non-tender, non-distended EXTREMITIES:  No edema; No deformity   ASSESSMENT AND PLAN:    1.  Persistent atrial fibrillation: Heart rate unfortunately has been rapid.  He has had a reduction in his ejection fraction.  He would benefit from rhythm control, but is quite concerned about ablation as he has had 2 ablations for atrial fibrillation in the past and has had continued arrhythmia.  Marrah Vanevery increase his Toprol XL to 100 mg twice daily and plan for amiodarone load.  Jaequan Propes plan for cardioversion after load.  I have told him that he would benefit most from ablation.  He  would like to think about this further.  I do not feel that amiodarone would be a good long-term medication.  2.  Chronic systolic heart failure: Ejection fraction improved with maintenance of sinus rhythm.  Ejection fraction is now reduced at 40 to 45%.  Maintenance of sinus rhythm is imperative.  Increasing Toprol-XL.  3.  Hypertension: Well-controlled  4.  Secondary hypercoagulable state: Currently on Xarelto for atrial fibrillation  Follow up with Afib Clinic in 4 weeks  Signed, Shatasha Lambing Jorja Loa, MD

## 2023-05-19 NOTE — Patient Instructions (Addendum)
Medication Instructions:  Your physician has recommended you make the following change in your medication:  Increase Toprol to 100 mg twice daily START Amiodarone  - take 2 tablets (400 mg total) TWICE a day for 2 weeks, then  - take 1 tablet (200 mg total) TWICE a day for 2 weeks, then  - take 1 tablet (200 mg total) ONCE a day   *If you need a refill on your cardiac medications before your next appointment, please call your pharmacy*   Lab Work: None ordered   Testing/Procedures: Your physician has recommended that you have a Cardioversion (DCCV). Electrical Cardioversion uses a jolt of electricity to your heart either through paddles or wired patches attached to your chest. This is a controlled, usually prescheduled, procedure. Defibrillation is done under light anesthesia in the hospital, and you usually go home the day of the procedure. This is done to get your heart back into a normal rhythm. You are not awake for the procedure. Please see instructions below    Follow-Up: At [facility], you and your health needs are our priority.  As part of our continuing mission to provide you with exceptional heart care, we have created designated Provider Care Teams.  These Care Teams include your primary Cardiologist (physician) and Advanced Practice Providers (APPs -  Physician Assistants and Nurse Practitioners) who all work together to provide you with the care you need, when you need it.   Your physician recommends that you schedule a follow-up appointment in: 2 months with AFib clinic.  (Beginning of February)   Your next appointment:   4 month(s)  The format for your next appointment:   In Person  Provider:   Loman Brooklyn, MD{   Thank you for choosing [facility]!!   Dory Horn, RN [phone]  Other Instructions      Dear [name]  You are scheduled for a Cardioversion on Friday, January 10 with Dr. Eden Emms.  Please arrive at the [facility] (Main  Entrance A) at [facility]: [address] [place], [place] [address] at 8:00 AM (This time is 1 hour(s) before your procedure to ensure your preparation).   Free valet parking service is available. You will check in at ADMITTING.   *Please Note: You will receive a call the day before your procedure to confirm the appointment time. That time may have changed from the original time based on the schedule for that day.*    DIET:  Nothing to eat or drink after midnight except a sip of water with medications (see medication instructions below)  MEDICATION INSTRUCTIONS: !!IF ANY NEW MEDICATIONS ARE STARTED AFTER TODAY, PLEASE NOTIFY YOUR PROVIDER AS SOON AS POSSIBLE!!  FYI: Medications such as Semaglutide (Ozempic, [place]), Tirzepatide (Mounjaro, Zepbound), Dulaglutide (Trulicity), etc ("GLP1 agonists") AND Canagliflozin (Invokana), Dapagliflozin (Farxiga), Empagliflozin (Jardiance), Ertugliflozin (Steglatro), Bexagliflozin [employer]) or any combination with one of these drugs such as Invokamet (Canagliflozin/Metformin), Synjardy (Empagliflozin/Metformin), etc ("SGLT2 inhibitors") must be held around the time of a procedure. This is not a comprehensive list of all of these drugs. Please review all of your medications and talk to your provider if you take any one of these. If you are not sure, ask your provider.   Hold your morning medications the morning of this procedure.    Continue taking your anticoagulant (blood thinner): Rivaroxaban (Xarelto).  You will need to continue this after your procedure until you are told by your provider that it is safe to stop.  LABS:  Your labs will be done at the hospital prior to your procedure   FYI:  For your safety, and to allow [place] to monitor your vital signs accurately during the surgery/procedure we request: If you have artificial nails, gel coating, SNS etc, please have those removed prior to your surgery/procedure. Not having the nail coverings  /polish removed may result in cancellation or delay of your surgery/procedure.  Your support person will be asked to wait in the waiting room during your procedure.  It is OK to have someone drop you off and come back when you are ready to be discharged.  You cannot drive after the procedure and will need someone to drive you home.  Bring your insurance cards.  *Special Note: Every effort is made to have your procedure done on time. Occasionally there are emergencies that occur at the hospital that may cause delays. Please be patient if a delay does occur.       Amiodarone Tablets What is this medication? AMIODARONE (a MEE oh da rone) prevents and treats a fast or irregular heartbeat (arrhythmia). It works by slowing down overactive electric signals in the heart, which stabilizes your heart rhythm. It belongs to a group of medications called antiarrhythmics. This medicine may be used for other purposes; ask your health care provider or pharmacist if you have questions. COMMON BRAND NAME(S): Cordarone, Pacerone What should I tell my care team before I take this medication? They need to know if you have any of these conditions: Liver disease Lung disease Other heart problems Thyroid disease An unusual or allergic reaction to amiodarone, iodine, other medications, foods, dyes, or preservatives Pregnant or trying to get pregnant Breast-feeding How should I use this medication? Take this medication by mouth with water. Take it as directed on the prescription label at the same time every day. You can take it with or without food. You should always take it the same way. Keep taking it unless your care team tells you to stop. A special MedGuide will be given to you by the pharmacist with each prescription and refill. Be sure to read this information carefully each time. Talk to your care team about the use of this medication in children. Special care may be needed. Overdosage: If you think you  have taken too much of this medicine contact a poison control center or emergency room at once. NOTE: This medicine is only for you. Do not share this medicine with others. What if I miss a dose? If you miss a dose, take it as soon as you can. If it is almost time for your next dose, take only that dose. Do not take double or extra doses. What may interact with this medication? Do not take this medication with any of the following: Abarelix Apomorphine Arsenic trioxide Certain antibiotics, such as erythromycin, gemifloxacin, levofloxacin, or pentamidine Certain medications for depression, such as amoxapine or tricyclic antidepressants Certain medications for fungal infections, such as fluconazole, itraconazole, ketoconazole, posaconazole, or voriconazole Certain medications for irregular heartbeat, such as disopyramide, dronedarone, ibutilide, propafenone, or sotalol Certain medications for malaria, such as chloroquine or halofantrine Cisapride Droperidol Haloperidol [place] Maprotiline Methadone Phenothiazines, such as chlorpromazine, mesoridazine, or thioridazine Pimozide Ranolazine Red yeast rice Vardenafil This medication may also interact with the following: Antivirals for HIV Certain medications for blood pressure, heart disease, irregular heartbeat Certain medications for cholesterol, such as atorvastatin, cerivastatin, lovastatin, or simvastatin Certain medications for hepatitis C, such as sofosbuvir and ledipasvir; sofosbuvir Certain medications for  seizures, such as phenytoin Certain medications for thyroid problems Certain medications that prevent or treat blood clots, such as warfarin Cholestyramine Cimetidine Clopidogrel Cyclosporine Dextromethorphan Diuretics Dofetilide Fentanyl General anesthetics Grapefruit juice Lidocaine Loratadine Methotrexate Other medications that cause heart rhythm changes Procainamide Quinidine Rifabutin, rifampin, or  rifapentine St. [name]'s Wort Trazodone Ziprasidone This list may not describe all possible interactions. Give your health care provider a list of all the medicines, herbs, non-prescription drugs, or dietary supplements you use. Also tell them if you smoke, drink alcohol, or use illegal drugs. Some items may interact with your medicine. What should I watch for while using this medication? Your condition will be monitored closely when you first begin therapy. This medication is often started in a hospital or other monitored health care setting. Once you are on maintenance therapy, visit your care team for regular checks on your progress. Because your condition and use of this medication carry some risk, it is a good idea to carry an identification card, necklace, or bracelet with details of your condition, medications, and care team. This medication may affect your coordination, reaction time, or judgment. Do not drive or operate machinery until you know how this medication affects you. Sit up or stand slowly to reduce the risk of dizzy or fainting spells. Drinking alcohol with this medication can increase the risk of these side effects. This medication can make you more sensitive to the sun. Keep out of the sun. If you cannot avoid being in the sun, wear protective clothing and sunscreen. Do not use sun lamps, tanning beds, or tanning booths. You should have regular eye exams before and during treatment. Call your care team if you have blurred vision, see halos, or your eyes become sensitive to light. Your eyes may get dry. It may be helpful to use a lubricating eye solution or artificial tears solution. If you are going to have surgery or a procedure that requires contrast dyes, tell your care team that you are taking this medication. What side effects may I notice from receiving this medication? Side effects that you should report to your care team as soon as possible: Allergic reactions--skin rash,  itching, hives, swelling of the face, lips, tongue, or throat Bluish-gray skin Change in vision such as blurry vision, seeing halos around lights, vision loss Heart failure--shortness of breath, swelling of the ankles, feet, or hands, sudden weight gain, unusual weakness or fatigue Heart rhythm changes--fast or irregular heartbeat, dizziness, feeling faint or lightheaded, chest pain, trouble breathing High thyroid levels (hyperthyroidism)--fast or irregular heartbeat, weight loss, excessive sweating or sensitivity to heat, tremors or shaking, anxiety, nervousness, irregular menstrual cycle or spotting Liver injury--right upper belly pain, loss of appetite, nausea, light-colored stool, dark yellow or brown urine, yellowing skin or eyes, unusual weakness or fatigue Low thyroid levels (hypothyroidism)--unusual weakness or fatigue, sensitivity to cold, constipation, hair loss, dry skin, weight gain, feelings of depression Lung injury--shortness of breath or trouble breathing, cough, spitting up blood, chest pain, fever Pain, tingling, or numbness in the hands or feet, muscle weakness, trouble walking, loss of balance or coordination Side effects that usually do not require medical attention (report to your care team if they continue or are bothersome): Nausea Vomiting This list may not describe all possible side effects. Call your doctor for medical advice about side effects. You may report side effects to FDA at 1-800-FDA-1088. Where should I keep my medication? Keep out of the reach of children and pets. Store at room temperature between 20  and 25 degrees C (68 and 77 degrees F). Protect from light. Keep container tightly closed. Throw away any unused medication after the expiration date. NOTE: This sheet is a summary. It may not cover all possible information. If you have questions about this medicine, talk to your doctor, pharmacist, or health care provider.  2024 Elsevier/Gold Standard  (2021-12-12 00:00:00)

## 2023-06-15 ENCOUNTER — Telehealth (HOSPITAL_COMMUNITY): Payer: Self-pay | Admitting: *Deleted

## 2023-06-15 NOTE — Telephone Encounter (Addendum)
 Patient had called to afib clinic regarding amiodarone  refill. Upon chart review noticed pt was loaded on amiodarone  in December with plan for cardioversion but this was canceled (no notes in chart indicating why this was canceled). Inquired with patient regarding cardioversion - pt states he decided he did not want to proceed with cardioversion which he self-canceled but instead would prefer for medications to return him to normal rhythm. Attempted to educate patient on rationale behind after amiodarone  load cardioversion is performed to encourage normal rhythm but pt states this is not the route he wants to proceed. Confirmed with patient his heart rates are currently in the 100-110 range after metoprolol  increase at last office visit. Pt seemed very concerned regarding cost of procedures including cardioversion/ablation and this factoring into his decisions as well. Confirmed with patient he should only be taking amiodarone  200mg  once a day at this time. Pt to keep scheduled follow up end of January.

## 2023-06-18 ENCOUNTER — Ambulatory Visit (HOSPITAL_COMMUNITY)
Admission: RE | Admit: 2023-06-18 | Payer: No Typology Code available for payment source | Source: Home / Self Care | Admitting: Cardiovascular Disease

## 2023-06-18 ENCOUNTER — Encounter (HOSPITAL_COMMUNITY): Admission: RE | Payer: Self-pay | Source: Home / Self Care

## 2023-06-18 ENCOUNTER — Other Ambulatory Visit (INDEPENDENT_AMBULATORY_CARE_PROVIDER_SITE_OTHER): Payer: No Typology Code available for payment source

## 2023-06-18 DIAGNOSIS — I4891 Unspecified atrial fibrillation: Secondary | ICD-10-CM

## 2023-06-18 DIAGNOSIS — R972 Elevated prostate specific antigen [PSA]: Secondary | ICD-10-CM | POA: Diagnosis not present

## 2023-06-18 LAB — PSA: PSA: 0.47 ng/mL (ref 0.10–4.00)

## 2023-06-18 SURGERY — CARDIOVERSION (CATH LAB)
Anesthesia: Monitor Anesthesia Care

## 2023-07-06 ENCOUNTER — Encounter (HOSPITAL_COMMUNITY): Payer: Self-pay | Admitting: Physician Assistant

## 2023-07-06 ENCOUNTER — Ambulatory Visit (HOSPITAL_COMMUNITY)
Admission: RE | Admit: 2023-07-06 | Discharge: 2023-07-06 | Disposition: A | Discharge status: Home / Self Care | Payer: No Typology Code available for payment source | Source: Ambulatory Visit | Attending: Physician Assistant | Admitting: Physician Assistant

## 2023-07-06 VITALS — BP 110/70 | HR 103 | Ht 74.0 in | Wt 331.8 lb

## 2023-07-06 DIAGNOSIS — Z5181 Encounter for therapeutic drug level monitoring: Secondary | ICD-10-CM | POA: Diagnosis not present

## 2023-07-06 DIAGNOSIS — Z7901 Long term (current) use of anticoagulants: Secondary | ICD-10-CM | POA: Insufficient documentation

## 2023-07-06 DIAGNOSIS — G4733 Obstructive sleep apnea (adult) (pediatric): Secondary | ICD-10-CM | POA: Insufficient documentation

## 2023-07-06 DIAGNOSIS — I4510 Unspecified right bundle-branch block: Secondary | ICD-10-CM | POA: Insufficient documentation

## 2023-07-06 DIAGNOSIS — D6869 Other thrombophilia: Secondary | ICD-10-CM | POA: Diagnosis not present

## 2023-07-06 DIAGNOSIS — I5022 Chronic systolic (congestive) heart failure: Secondary | ICD-10-CM | POA: Insufficient documentation

## 2023-07-06 DIAGNOSIS — I4729 Other ventricular tachycardia: Secondary | ICD-10-CM | POA: Insufficient documentation

## 2023-07-06 DIAGNOSIS — E669 Obesity, unspecified: Secondary | ICD-10-CM | POA: Diagnosis not present

## 2023-07-06 DIAGNOSIS — Z6841 Body Mass Index (BMI) 40.0 and over, adult: Secondary | ICD-10-CM | POA: Insufficient documentation

## 2023-07-06 DIAGNOSIS — I4892 Unspecified atrial flutter: Secondary | ICD-10-CM | POA: Diagnosis present

## 2023-07-06 DIAGNOSIS — I4819 Other persistent atrial fibrillation: Secondary | ICD-10-CM

## 2023-07-06 DIAGNOSIS — I110 Hypertensive heart disease with heart failure: Secondary | ICD-10-CM | POA: Insufficient documentation

## 2023-07-06 DIAGNOSIS — Z79899 Other long term (current) drug therapy: Secondary | ICD-10-CM | POA: Diagnosis not present

## 2023-07-06 MED ORDER — AMIODARONE HCL 200 MG PO TABS: 200.0000 mg | ORAL_TABLET | Freq: Every day | ORAL | 3 refills | Status: DC

## 2023-07-06 NOTE — Progress Notes (Signed)
Primary Care Physician: Tresa Garter, MD Primary Cardiologist: Dr [name] Primary Electrophysiologist: Dr Elberta Fortis  Referring Physician: Dr Tito Dine ELGIN CARN is a 43 y.o. male with a history of persistent atrial fibrillation, atrial flutter, OSA, tachycardia mediated CM, PVD who presents for follow up in the [facility]. Patient was admitted in 2014 with afib RVR and was loaded on amiodarone and became profoundly hypotensive requiring levophed and IVF.  He underwent TEE and failed DCCV on 3/9.  Dr. [name] placed him on Tikosyn and he had polymorphic VT.  Transitioned to oral amiodarone with good rate response.  Echo during admission showed EF 10% with diffuse hypokinesis.  He was diuresed and discharged on amiodarone, xarelto, digoxin, ramipril, lopressor and lasix.  S/P A-Flutter ablation 2014. EF subsequently returned to normal. Readmitted in July 2017 with HF and found to have recurrent AF with RVR. EF back down to 25-30%. Underwent attempt at [facility] on 01/03/16. Maintained NSR for 2 mintues and then back to AF. He was placed on IV amiodarone with close monitoring of BP along with oral amiodarone for faster loading. He eventually converted back to NSR on IV amiodarone. He continued to have episodes of symptomatic afib. He underwent repeat afib ablation on 03/30/19. He is on Xarelto for a CHADS2VASC score of 2.  Patient was seen by Dr Johney Frame 03/31/21 and was having palpitations about twice per week. A heart monitor 02/2020 showed 2% afib burden. He was started on flecainide. He wore another cardiac monitor which showed 100% afib burden. Flecainide was discontinued and he was loaded on amiodarone.   Patient returns for follow up for atrial fibrillation. Patient remains in persistent afib. Rates improved but not ideal. He does have some fatigue while in afib. No bleeding issues on anticoagulation.   Today, he denies symptoms of palpitations, chest pain,  orthopnea, PND, lower extremity edema, dizziness, presyncope, syncope, snoring, daytime somnolence, bleeding, or neurologic sequela. The patient is tolerating medications without difficulties and is otherwise without complaint today.    Atrial Fibrillation Risk Factors:  he does have symptoms or diagnosis of sleep apnea. he does not have a history of rheumatic fever. he does not have a history of alcohol use. The patient does not have a history of early familial atrial fibrillation or other arrhythmias.  he has a BMI of Body mass index is 42.6 kg/m.Marland Kitchen Filed Weights   07/06/23 1534  Weight: (!) 150.5 kg    Family History  Problem Relation Age of Onset   Diabetes Mother    Hypertension Mother    Varicose Veins Mother    Depression Father    Osteoarthritis Father    Alcohol abuse Father      Atrial Fibrillation Management history:  Previous antiarrhythmic drugs: flecainide, dofetilide, amiodarone Previous cardioversions: 2014, 2017, 2018 Previous ablations: flutter with Dr [name] 2014, afib ablations 2015, 03/30/19 Anticoagulation history: Xarelto   Past Medical History:  Diagnosis Date   Acute systolic heart failure (HCC) 09/14/2012   Anxiety 03/27/2020   Atrial flutter (HCC)    s/p ablation by Dr [name]   Medical history non-contributory    Neuromuscular disorder (HCC)    Onychomycosis    OSA on CPAP    Peripheral vascular disease (HCC)    Persistent atrial fibrillation (HCC) 2003    Current Outpatient Medications  Medication Sig Dispense Refill   acetaminophen (TYLENOL) 500 MG tablet Take 1,000 mg by mouth every 6 (six) hours as needed for headache.  amiodarone (PACERONE) 200 MG tablet Take 2 tablets (400 mg total) TWICE a day for 2 weeks, then take 1 tablet (200 mg total) TWICE a day for 2 weeks, then take 1 tablet (200 mg total) ONCE a day thereafter 90 tablet 3   B Complex-Folic Acid (B COMPLEX PLUS) TABS Take 1 tablet by mouth daily. 100 tablet 3    busPIRone (BUSPAR) 10 MG tablet Take 1 tablet (10 mg total) by mouth 2 (two) times daily. ONE TABLET BY MOUTH TWICE A DAY (Patient taking differently: Take 10 mg by mouth daily.) 180 tablet 1   Cholecalciferol (VITAMIN D3) 50 MCG (2000 UT) capsule Take 2 capsules (4,000 Units total) by mouth daily. 100 capsule 3   fexofenadine (ALLEGRA) 180 MG tablet Take 180 mg by mouth at bedtime as needed (Allergies).     furosemide (LASIX) 40 MG tablet Take 1 tablet (40 mg total) by mouth daily as needed for edema. 90 tablet 1   losartan (COZAAR) 25 MG tablet Take 1 tablet (25 mg total) by mouth daily. 90 tablet 2   metoprolol succinate (TOPROL-XL) 100 MG 24 hr tablet Take 1 tablet (100 mg total) by mouth 2 (two) times daily. Take with or immediately following a meal. 60 tablet 6   naphazoline-pheniramine (VISINE) 0.025-0.3 % ophthalmic solution Place 1 drop into both eyes daily as needed for eye irritation.     OVER THE COUNTER MEDICATION Take 1 Scoop by mouth daily at 12 noon. Juce power     rivaroxaban (XARELTO) 20 MG TABS tablet Take 1 tablet (20 mg total) by mouth daily with supper. 30 tablet 11   triamcinolone cream (KENALOG) 0.5 % Apply 1 Application topically 3 (three) times daily. (Patient taking differently: Apply 1 Application topically daily as needed (after medical tape).) 45 g 1   No current facility-administered medications for this encounter.     ROS- All systems are reviewed and negative except as per the HPI above.  Physical Exam: Vitals:   07/06/23 1534  BP: 110/70  Pulse: (!) 103  Weight: (!) 150.5 kg  Height: 6\' 2"  (1.88 m)    GEN: Well nourished, well developed in no acute distress NECK: No JVD; No carotid bruits CARDIAC: Irregularly irregular rate and rhythm, no murmurs, rubs, gallops RESPIRATORY:  Clear to auscultation without rales, wheezing or rhonchi  ABDOMEN: Soft, non-tender, non-distended EXTREMITIES:  No edema; No deformity    Wt Readings from Last 3 Encounters:   07/06/23 (!) 150.5 kg  05/19/23 (!) 149.1 kg  04/05/23 (!) 149.9 kg    EKG today demonstrates  Afib Vent. rate 103 BPM PR interval * ms QRS duration 110 ms QT/QTcB 366/479 ms   Echo 04/16/23 demonstrated   1. Global hypokinesis abnormal septal motion No mural apical thrombus.  Left ventricular ejection fraction, by estimation, is 40 to 45%. The left  ventricle has mildly decreased function. The left ventricle demonstrates  global hypokinesis. The left ventricular internal cavity size was mildly dilated. There is mild left ventricular hypertrophy. Left ventricular diastolic parameters are indeterminate.   2. Right ventricular systolic function is normal. The right ventricular  size is normal.   3. Left atrial size was mildly dilated.   4. The mitral valve is abnormal. Trivial mitral valve regurgitation. No  evidence of mitral stenosis.   5. The aortic valve is tricuspid. Aortic valve regurgitation is not  visualized. No aortic stenosis is present.   6. Aortic dilatation noted. There is mild dilatation of the aortic root,  measuring 38 mm.   7. The inferior vena cava is normal in size with greater than 50%  respiratory variability, suggesting right atrial pressure of 3 mmHg.   Epic records are reviewed at length today   CHA2DS2-VASc Score = 2  The patient's score is based upon: CHF History: 1 HTN History: 1 Diabetes History: 0 Stroke History: 0 Vascular Disease History: 0 Age Score: 0 Gender Score: 0       ASSESSMENT AND PLAN: Persistent Atrial Fibrillation/atrial flutter The patient's CHA2DS2-VASc score is 2, indicating a 2.2% annual risk of stroke.   S/p flutter ablation 2014 and afib ablation 2015, 2020. Previously failed dofetilide due to polymorphic VT Flecainide was ineffective.  Loaded on amiodarone 05/2023 We had a long discussion about rhythm control options including DCCV, PFA, surgical ablation, PPM + AVN ablation vs rate control. Would favor rhythm  control given his reduced EF. He is understandably frustrated with the persistence of his afib over the years. He is still considering his options and would like another consult with EP before committing to a course of action.  Continue Toprol 100 mg BID Continue Xarelto 20 mg daily  Secondary Hypercoagulable State (ICD10:  D68.69) The patient is at significant risk for stroke/thromboembolism based upon his CHA2DS2-VASc Score of 2.  Continue Rivaroxaban (Xarelto). No bleeding issues.  High Risk Medication Monitoring (ICD 10: Z79.899) Intervals on ECG appropriate on amiodarone. We discussed the potential toxicities of long term amiodarone use including thyroid, lung, and liver side effects.    Obesity Body mass index is 42.6 kg/m.  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    Jorja Loa PA-C [facility] [address] [place], [place] [address] [phone] 07/06/2023 3:39 PM

## 2023-08-05 NOTE — H&P (View-Only) (Signed)
 Electrophysiology Office Note:   Date:  08/07/2023  ID:  Lance [name], DOB Jul 18, 1980, MRN [id number]  Primary Cardiologist: None Electrophysiologist: Will Jorja Loa, MD      History of Present Illness:   Lance [name] is a 43 y.o. male with h/o persistent atrial fibrillation s/p ablation 2015 and 2020 by Dr. Johney Frame, atrial flutter s/p CTI ablation, OSA, tachycardia mediated CM, PVD and obesity who is being seen today for evaluation of his atrial fibrillation.  Discussed the use of AI scribe software for clinical note transcription with the patient, who gave verbal consent to proceed.  History of Present Illness   The patient has been experiencing AFib since the age of 33 and has undergone multiple ablations, but continues to have episodes. The patient reports feeling his heart "doing a dance" at times, particularly after exertion. Despite these symptoms, the patient denies experiencing any swelling in the legs or shortness of breath. The patient has been on various medications, including amiodarone, flecainide, and Tikosyn, in an attempt to manage his AFib. Currently, the patient is on amiodarone and metoprolol. The patient reports that the amiodarone seems to work when he is taking enough of it, but he is not currently on a high enough dose to maintain a normal rhythm. The patient has a history of heart failure associated with fast heart rates during AFib episodes. After the most recent ablation, the patient's heart pumping function improved, indicating that the heart can still get stronger when in normal rhythm. The patient is aware of the potential for his heart function to decline if he remains in AFib and is considering his options for further treatment. Currently, he has no HF symptoms.     Review of systems complete and found to be negative unless listed in HPI.   EP Information / Studies Reviewed:    EKG is not ordered today. EKG from 07/06/23 reviewed which showed atrial  fibrillation with RVR, 103bpm.      Echo 04/16/23:  1. Global hypokinesis abnormal septal motion No mural apical thrombus.  Left ventricular ejection fraction, by estimation, is 40 to 45%. The left  ventricle has mildly decreased function. The left ventricle demonstrates  global hypokinesis. The left  ventricular internal cavity size was mildly dilated. There is mild left  ventricular hypertrophy. Left ventricular diastolic parameters are  indeterminate.   2. Right ventricular systolic function is normal. The right ventricular  size is normal.   3. Left atrial size was mildly dilated.   4. The mitral valve is abnormal. Trivial mitral valve regurgitation. No  evidence of mitral stenosis.   5. The aortic valve is tricuspid. Aortic valve regurgitation is not  visualized. No aortic stenosis is present.   6. Aortic dilatation noted. There is mild dilatation of the aortic root,  measuring 38 mm.   7. The inferior vena cava is normal in size with greater than 50%  respiratory variability, suggesting right atrial pressure of 3 mmHg.   Zio 04/06/23: Patch Wear Time:  2 days and 22 hours   Atrial Fibrillation occurred continuously (100% burden) Heart rate ranging from 70-172 bpm (avg of 111 bpm).  Less than 1% ventricular ectopy No patient triggered episodes recorded  Risk Assessment/Calculations:    CHA2DS2-VASc Score = 2   This indicates a 2.2% annual risk of stroke. The patient's score is based upon: CHF History: 1 HTN History: 1 Diabetes History: 0 Stroke History: 0 Vascular Disease History: 0 Age Score: 0 Gender Score: 0  Physical Exam:   VS:  BP 118/74   Pulse 88   Ht 6\' 2"  (1.88 m)   Wt (!) 336 lb (152.4 kg)   SpO2 94%   BMI 43.14 kg/m    Wt Readings from Last 3 Encounters:  08/06/23 (!) 336 lb (152.4 kg)  07/06/23 (!) 331 lb 12.8 oz (150.5 kg)  05/19/23 (!) 328 lb 9.6 oz (149.1 kg)     GEN: Well nourished, well developed in no acute  distress NECK: No JVD CARDIAC: Normal rate, irregular rhythm. RESPIRATORY:  Clear to auscultation without rales, wheezing or rhonchi  ABDOMEN: Soft, non-distended EXTREMITIES:  No edema; No deformity   ASSESSMENT AND PLAN:   Lance [name] is a 43 y.o. male with h/o persistent atrial fibrillation s/p ablation 2015 and 2020 by Dr. Johney Frame, atrial flutter s/p CTI ablation, OSA, tachycardia mediated CM, PVD and obesity who is being seen today for evaluation of his atrial fibrillation.  #. Persistent atrial fibrillation: Associated with tachy/arrhythmia induced cardiomyopathy. He has had 2 prior ablations, 2015 and 2020 by Dr. Johney Frame. PVI only. Posterior wall has not been isolated. #. Atrial flutter s/p CTI ablation:  #. Secondary hypercoagulable state due to atrial fibrillation: CHADSVASC score of 2.  - Given development of tachy/arrhythmia induced cardiomyopathy we discussed importance of maintenance of sinus rhythm, if possible. We discussed both anti-arrhythmic drug therapy and catheter ablation options, risks and benefits of both. Would like to avoid long term use of amiodarone due to side effects, which were discussed. Patient is hesitant to undergo another catheter ablation at this time. I feel that catheter ablation may give him the best long-term chance at maintaining sinus rhythm. Would reassess pulmonary veins, as well as isolation of posterior wall and SVC, and looking for non-PV triggers. - Increase amiodarone to 200mg  twice daily for the next month. Retry cardioversion in 2 weeks.  - Start digoxin to assist with rate control.  - Continue metoprolol.  - Continue Xarelto. - If able to maintain sinus then repeat echo at/before next visit.  #. Chronic systolic heart failure: Tachy/arrhythmia induced cardiomyopathy. EF had recovered in absence of AF now decreased to 40-45% on last echo. - Well compensated currently with no obvious HF symptoms.  - Given tachy/arrhythmia induced component, we  are prioritizing rhythm control over rate control as discussed above.  #. High risk medication monitoring: TSH normal at 2.91 on 04/05/23. LFTs normal 04/05/23. Intervals on ECG appropriate on amiodarone. We discussed the potential toxicities of long term amiodarone use including thyroid, lung, and liver side effects.    #. Hypertension -At goal today.  Recommend checking blood pressures 1-2 times per week at home and recording the values.  Recommend bringing these recordings to the primary care physician.  #. Obesity, class III: - We discussed role of weight loss in decreasing risk of AF recurrence.   Follow up with Dr. Jimmey Ralph in 3 months  Signed, Nobie Putnam, MD

## 2023-08-05 NOTE — Progress Notes (Signed)
 Electrophysiology Office Note:   Date:  08/07/2023  ID:  Lance [name], DOB Jul 18, 1980, MRN [id number]  Primary Cardiologist: None Electrophysiologist: Will Jorja Loa, MD      History of Present Illness:   Lance [name] is a 43 y.o. male with h/o persistent atrial fibrillation s/p ablation 2015 and 2020 by Dr. Johney Frame, atrial flutter s/p CTI ablation, OSA, tachycardia mediated CM, PVD and obesity who is being seen today for evaluation of his atrial fibrillation.  Discussed the use of AI scribe software for clinical note transcription with the patient, who gave verbal consent to proceed.  History of Present Illness   The patient has been experiencing AFib since the age of 33 and has undergone multiple ablations, but continues to have episodes. The patient reports feeling his heart "doing a dance" at times, particularly after exertion. Despite these symptoms, the patient denies experiencing any swelling in the legs or shortness of breath. The patient has been on various medications, including amiodarone, flecainide, and Tikosyn, in an attempt to manage his AFib. Currently, the patient is on amiodarone and metoprolol. The patient reports that the amiodarone seems to work when he is taking enough of it, but he is not currently on a high enough dose to maintain a normal rhythm. The patient has a history of heart failure associated with fast heart rates during AFib episodes. After the most recent ablation, the patient's heart pumping function improved, indicating that the heart can still get stronger when in normal rhythm. The patient is aware of the potential for his heart function to decline if he remains in AFib and is considering his options for further treatment. Currently, he has no HF symptoms.     Review of systems complete and found to be negative unless listed in HPI.   EP Information / Studies Reviewed:    EKG is not ordered today. EKG from 07/06/23 reviewed which showed atrial  fibrillation with RVR, 103bpm.      Echo 04/16/23:  1. Global hypokinesis abnormal septal motion No mural apical thrombus.  Left ventricular ejection fraction, by estimation, is 40 to 45%. The left  ventricle has mildly decreased function. The left ventricle demonstrates  global hypokinesis. The left  ventricular internal cavity size was mildly dilated. There is mild left  ventricular hypertrophy. Left ventricular diastolic parameters are  indeterminate.   2. Right ventricular systolic function is normal. The right ventricular  size is normal.   3. Left atrial size was mildly dilated.   4. The mitral valve is abnormal. Trivial mitral valve regurgitation. No  evidence of mitral stenosis.   5. The aortic valve is tricuspid. Aortic valve regurgitation is not  visualized. No aortic stenosis is present.   6. Aortic dilatation noted. There is mild dilatation of the aortic root,  measuring 38 mm.   7. The inferior vena cava is normal in size with greater than 50%  respiratory variability, suggesting right atrial pressure of 3 mmHg.   Zio 04/06/23: Patch Wear Time:  2 days and 22 hours   Atrial Fibrillation occurred continuously (100% burden) Heart rate ranging from 70-172 bpm (avg of 111 bpm).  Less than 1% ventricular ectopy No patient triggered episodes recorded  Risk Assessment/Calculations:    CHA2DS2-VASc Score = 2   This indicates a 2.2% annual risk of stroke. The patient's score is based upon: CHF History: 1 HTN History: 1 Diabetes History: 0 Stroke History: 0 Vascular Disease History: 0 Age Score: 0 Gender Score: 0  Physical Exam:   VS:  BP 118/74   Pulse 88   Ht 6\' 2"  (1.88 m)   Wt (!) 336 lb (152.4 kg)   SpO2 94%   BMI 43.14 kg/m    Wt Readings from Last 3 Encounters:  08/06/23 (!) 336 lb (152.4 kg)  07/06/23 (!) 331 lb 12.8 oz (150.5 kg)  05/19/23 (!) 328 lb 9.6 oz (149.1 kg)     GEN: Well nourished, well developed in no acute  distress NECK: No JVD CARDIAC: Normal rate, irregular rhythm. RESPIRATORY:  Clear to auscultation without rales, wheezing or rhonchi  ABDOMEN: Soft, non-distended EXTREMITIES:  No edema; No deformity   ASSESSMENT AND PLAN:   Lance [name] is a 43 y.o. male with h/o persistent atrial fibrillation s/p ablation 2015 and 2020 by Dr. Johney Frame, atrial flutter s/p CTI ablation, OSA, tachycardia mediated CM, PVD and obesity who is being seen today for evaluation of his atrial fibrillation.  #. Persistent atrial fibrillation: Associated with tachy/arrhythmia induced cardiomyopathy. He has had 2 prior ablations, 2015 and 2020 by Dr. Johney Frame. PVI only. Posterior wall has not been isolated. #. Atrial flutter s/p CTI ablation:  #. Secondary hypercoagulable state due to atrial fibrillation: CHADSVASC score of 2.  - Given development of tachy/arrhythmia induced cardiomyopathy we discussed importance of maintenance of sinus rhythm, if possible. We discussed both anti-arrhythmic drug therapy and catheter ablation options, risks and benefits of both. Would like to avoid long term use of amiodarone due to side effects, which were discussed. Patient is hesitant to undergo another catheter ablation at this time. I feel that catheter ablation may give him the best long-term chance at maintaining sinus rhythm. Would reassess pulmonary veins, as well as isolation of posterior wall and SVC, and looking for non-PV triggers. - Increase amiodarone to 200mg  twice daily for the next month. Retry cardioversion in 2 weeks.  - Start digoxin to assist with rate control.  - Continue metoprolol.  - Continue Xarelto. - If able to maintain sinus then repeat echo at/before next visit.  #. Chronic systolic heart failure: Tachy/arrhythmia induced cardiomyopathy. EF had recovered in absence of AF now decreased to 40-45% on last echo. - Well compensated currently with no obvious HF symptoms.  - Given tachy/arrhythmia induced component, we  are prioritizing rhythm control over rate control as discussed above.  #. High risk medication monitoring: TSH normal at 2.91 on 04/05/23. LFTs normal 04/05/23. Intervals on ECG appropriate on amiodarone. We discussed the potential toxicities of long term amiodarone use including thyroid, lung, and liver side effects.    #. Hypertension -At goal today.  Recommend checking blood pressures 1-2 times per week at home and recording the values.  Recommend bringing these recordings to the primary care physician.  #. Obesity, class III: - We discussed role of weight loss in decreasing risk of AF recurrence.   Follow up with Dr. Jimmey Ralph in 3 months  Signed, Nobie Putnam, MD

## 2023-08-06 ENCOUNTER — Ambulatory Visit: Payer: No Typology Code available for payment source | Attending: Cardiology | Admitting: Cardiology

## 2023-08-06 ENCOUNTER — Other Ambulatory Visit: Payer: Self-pay

## 2023-08-06 ENCOUNTER — Encounter: Payer: Self-pay | Admitting: Cardiology

## 2023-08-06 ENCOUNTER — Telehealth: Payer: Self-pay | Admitting: Cardiology

## 2023-08-06 VITALS — BP 118/74 | HR 88 | Ht 74.0 in | Wt 336.0 lb

## 2023-08-06 DIAGNOSIS — I4819 Other persistent atrial fibrillation: Secondary | ICD-10-CM

## 2023-08-06 DIAGNOSIS — Z79899 Other long term (current) drug therapy: Secondary | ICD-10-CM

## 2023-08-06 DIAGNOSIS — D6869 Other thrombophilia: Secondary | ICD-10-CM | POA: Diagnosis not present

## 2023-08-06 DIAGNOSIS — I10 Essential (primary) hypertension: Secondary | ICD-10-CM

## 2023-08-06 DIAGNOSIS — I5022 Chronic systolic (congestive) heart failure: Secondary | ICD-10-CM

## 2023-08-06 MED ORDER — DIGOXIN 125 MCG PO TABS: 0.1250 mg | ORAL_TABLET | Freq: Every day | ORAL | 3 refills | Status: DC

## 2023-08-06 MED ORDER — AMIODARONE HCL 200 MG PO TABS: ORAL_TABLET | ORAL | 3 refills | Status: AC

## 2023-08-06 NOTE — Telephone Encounter (Signed)
 Pt c/o medication issue:  1. Name of Medication:   amiodarone (PACERONE) 200 MG tablet  digoxin (LANOXIN) 0.125 MG tablet   2. How are you currently taking this medication (dosage and times per day)? As written  3. Are you having a reaction (difficulty breathing--STAT)? no  4. What is your medication issue? Pharmacy wants to know if pt will still be taking the Amiodarone because it interacts with the Digoxin. Please advise

## 2023-08-06 NOTE — Patient Instructions (Addendum)
 Medication Instructions:  Your physician has recommended you make the following change in your medication:  1) INCREASE amiodarone to 400 mg daily for one month, then decrease to 200 mg daily 2) START taking digoxin 0.125 mg daily  Lab Work: BMET and CBC  Testing/Procedures: Cardioversion Your physician has recommended that you have a Cardioversion (DCCV). Electrical Cardioversion uses a jolt of electricity to your heart either through paddles or wired patches attached to your chest. This is a controlled, usually prescheduled, procedure. Defibrillation is done under light anesthesia in the hospital, and you usually go home the day of the procedure. This is done to get your heart back into a normal rhythm. You are not awake for the procedure. Please see the instruction sheet given to you today.  Echocardiogram - in 2 months Your physician has requested that you have an echocardiogram. Echocardiography is a painless test that uses sound waves to create images of your heart. It provides your doctor with information about the size and shape of your heart and how well your heart's chambers and valves are working. This procedure takes approximately one hour. There are no restrictions for this procedure. Please do NOT wear cologne, perfume, aftershave, or lotions (deodorant is allowed). Please arrive 15 minutes prior to your appointment time.  Follow-Up: At [facility], you and your health needs are our priority.  As part of our continuing mission to provide you with exceptional heart care, we have created designated Provider Care Teams.  These Care Teams include your primary Cardiologist (physician) and Advanced Practice Providers (APPs -  Physician Assistants and Nurse Practitioners) who all work together to provide you with the care you need, when you need it.  Your next appointment:   3 months  Provider:   Nobie Putnam, MD

## 2023-08-07 LAB — BASIC METABOLIC PANEL
BUN/Creatinine Ratio: 15 (ref 9–20)
BUN: 15 mg/dL (ref 6–24)
CO2: 21 mmol/L (ref 20–29)
Calcium: 9.1 mg/dL (ref 8.7–10.2)
Chloride: 105 mmol/L (ref 96–106)
Creatinine, Ser: 0.99 mg/dL (ref 0.76–1.27)
Glucose: 80 mg/dL (ref 70–99)
Potassium: 4.9 mmol/L (ref 3.5–5.2)
Sodium: 141 mmol/L (ref 134–144)
eGFR: 96 mL/min/{1.73_m2} (ref >59)

## 2023-08-07 LAB — CBC
Hematocrit: 42.9 % (ref 37.5–51.0)
Hemoglobin: 14.7 g/dL (ref 13.0–17.7)
MCH: 31.5 pg (ref 26.6–33.0)
MCHC: 34.3 g/dL (ref 31.5–35.7)
MCV: 92 fL (ref 79–97)
Platelets: 224 10*3/uL (ref 150–450)
RBC: 4.67 x10E6/uL (ref 4.14–5.80)
RDW: 13.2 % (ref 11.6–15.4)
WBC: 6.4 10*3/uL (ref 3.4–10.8)

## 2023-08-09 ENCOUNTER — Ambulatory Visit (HOSPITAL_COMMUNITY): Payer: No Typology Code available for payment source | Admitting: Physician Assistant

## 2023-08-11 ENCOUNTER — Encounter: Payer: Self-pay | Admitting: Cardiology

## 2023-08-11 MED ORDER — DIGOXIN 125 MCG PO TABS: 0.0625 mg | ORAL_TABLET | Freq: Every day | ORAL | 3 refills | Status: AC

## 2023-08-11 NOTE — Telephone Encounter (Signed)
 Per Dr. Jimmey Ralph he is in agreement with plan from PharmD. New prescription has been sent in and labs have been ordered. Patient made aware through MyChart.  Spoke with the pharmacist and made them aware as well.

## 2023-08-11 NOTE — Telephone Encounter (Signed)
 I would suggest starting at digoxin 0.0625mg  (1/2 tablet) daily. Would check a level 5-7 days after starting. He should get the level drawn just prior to when he would take his next dose (12-24 hours AFTER his last dose of digoxin)

## 2023-08-19 NOTE — Progress Notes (Signed)
 Spoke to patient and instructed them to come at 0630  and to be NPO after 0000.  Medications reviewed.    Confirmed that patient will have a ride home and someone to stay with them for 24 hours after the procedure.

## 2023-08-20 ENCOUNTER — Ambulatory Visit (HOSPITAL_COMMUNITY)
Admission: RE | Admit: 2023-08-20 | Discharge: 2023-08-20 | Disposition: A | Discharge status: Home / Self Care | Payer: No Typology Code available for payment source | Attending: Cardiology | Admitting: Cardiology

## 2023-08-20 ENCOUNTER — Other Ambulatory Visit: Payer: Self-pay

## 2023-08-20 ENCOUNTER — Ambulatory Visit (HOSPITAL_COMMUNITY): Admitting: Anesthesiology

## 2023-08-20 ENCOUNTER — Encounter (HOSPITAL_COMMUNITY)
Admission: RE | Disposition: A | Discharge status: Home / Self Care | Payer: Self-pay | Source: Home / Self Care | Attending: Cardiology

## 2023-08-20 DIAGNOSIS — E1151 Type 2 diabetes mellitus with diabetic peripheral angiopathy without gangrene: Secondary | ICD-10-CM | POA: Diagnosis not present

## 2023-08-20 DIAGNOSIS — I4819 Other persistent atrial fibrillation: Secondary | ICD-10-CM | POA: Insufficient documentation

## 2023-08-20 DIAGNOSIS — I4892 Unspecified atrial flutter: Secondary | ICD-10-CM | POA: Insufficient documentation

## 2023-08-20 DIAGNOSIS — D6869 Other thrombophilia: Secondary | ICD-10-CM | POA: Insufficient documentation

## 2023-08-20 DIAGNOSIS — I4891 Unspecified atrial fibrillation: Secondary | ICD-10-CM

## 2023-08-20 DIAGNOSIS — I110 Hypertensive heart disease with heart failure: Secondary | ICD-10-CM | POA: Insufficient documentation

## 2023-08-20 DIAGNOSIS — I5022 Chronic systolic (congestive) heart failure: Secondary | ICD-10-CM | POA: Diagnosis not present

## 2023-08-20 DIAGNOSIS — E66813 Obesity, class 3: Secondary | ICD-10-CM | POA: Diagnosis not present

## 2023-08-20 DIAGNOSIS — Z6841 Body Mass Index (BMI) 40.0 and over, adult: Secondary | ICD-10-CM | POA: Insufficient documentation

## 2023-08-20 DIAGNOSIS — Z79899 Other long term (current) drug therapy: Secondary | ICD-10-CM | POA: Insufficient documentation

## 2023-08-20 DIAGNOSIS — Z7901 Long term (current) use of anticoagulants: Secondary | ICD-10-CM | POA: Diagnosis not present

## 2023-08-20 DIAGNOSIS — G4733 Obstructive sleep apnea (adult) (pediatric): Secondary | ICD-10-CM | POA: Insufficient documentation

## 2023-08-20 DIAGNOSIS — I429 Cardiomyopathy, unspecified: Secondary | ICD-10-CM | POA: Insufficient documentation

## 2023-08-20 DIAGNOSIS — Z01818 Encounter for other preprocedural examination: Secondary | ICD-10-CM

## 2023-08-20 HISTORY — PX: CARDIOVERSION: EP1203

## 2023-08-20 SURGERY — CARDIOVERSION (CATH LAB)
Anesthesia: Monitor Anesthesia Care

## 2023-08-20 MED ORDER — SODIUM CHLORIDE 0.9 % IV SOLN: INTRAVENOUS | Status: DC

## 2023-08-20 MED ORDER — PROPOFOL 10 MG/ML IV BOLUS
INTRAVENOUS | Status: DC | PRN
Start: 2023-08-20 — End: 2023-08-20
  Administered 2023-08-20: 40 mg via INTRAVENOUS
  Administered 2023-08-20: 70 mg via INTRAVENOUS

## 2023-08-20 MED ORDER — LIDOCAINE 2% (20 MG/ML) 5 ML SYRINGE
INTRAMUSCULAR | Status: DC | PRN
  Administered 2023-08-20: 60 mg via INTRAVENOUS

## 2023-08-20 SURGICAL SUPPLY — 1 items: PAD DEFIB RADIO PHYSIO CONN (PAD) ×1 IMPLANT

## 2023-08-20 NOTE — Interval H&P Note (Signed)
 History and Physical Interval Note:  08/20/2023 7:31 AM  Lance [name]  has presented today for surgery, with the diagnosis of afib.  The various methods of treatment have been discussed with the patient and family. After consideration of risks, benefits and other options for treatment, the patient has consented to  Procedure(s): CARDIOVERSION (N/A) as a surgical intervention.  The patient's history has been reviewed, patient examined, no change in status, stable for surgery.  I have reviewed the patient's chart and labs.  Questions were answered to the patient's satisfaction.     Little Ishikawa

## 2023-08-20 NOTE — Anesthesia Preprocedure Evaluation (Addendum)
 Anesthesia Evaluation  Patient identified by MRN, date of birth, ID band Patient awake    Reviewed: Allergy & Precautions, H&P , NPO status , Patient's Chart, lab work & pertinent test results, reviewed documented beta blocker date and time   History of Anesthesia Complications Negative for: history of anesthetic complications  Airway Mallampati: II  TM Distance: >3 FB Neck ROM: full    Dental  (+) Dental Advisory Given   Pulmonary sleep apnea and Continuous Positive Airway Pressure Ventilation , Current Smoker and Patient abstained from smoking., former smoker   breath sounds clear to auscultation       Cardiovascular + Peripheral Vascular Disease  + dysrhythmias Atrial Fibrillation  Rhythm:Irregular   1. Global hypokinesis abnormal septal motion No mural apical thrombus.  Left ventricular ejection fraction, by estimation, is 40 to 45%. The left  ventricle has mildly decreased function. The left ventricle demonstrates  global hypokinesis. The left  ventricular internal cavity size was mildly dilated. There is mild left  ventricular hypertrophy. Left ventricular diastolic parameters are  indeterminate.   2. Right ventricular systolic function is normal. The right ventricular  size is normal.   3. Left atrial size was mildly dilated.   4. The mitral valve is abnormal. Trivial mitral valve regurgitation. No  evidence of mitral stenosis.   5. The aortic valve is tricuspid. Aortic valve regurgitation is not  visualized. No aortic stenosis is present.   6. Aortic dilatation noted. There is mild dilatation of the aortic root,  measuring 38 mm.   7. The inferior vena cava is normal in size with greater than 50%  respiratory variability, suggesting right atrial pressure of 3 mmHg.     Neuro/Psych negative neurological ROS  negative psych ROS   GI/Hepatic negative GI ROS, Neg liver ROS,,,  Endo/Other    Class 3 obesity   Renal/GU negative Renal ROS     Musculoskeletal negative musculoskeletal ROS (+)    Abdominal   Peds  Hematology negative hematology ROS (+) Lab Results      Component                Value               Date                      WBC                      6.4                 08/06/2023                HGB                      14.7                08/06/2023                HCT                      42.9                08/06/2023                MCV                      92  08/06/2023                PLT                      224                 08/06/2023             xarelto   Anesthesia Other Findings   Reproductive/Obstetrics                             Anesthesia Physical Anesthesia Plan  ASA: 3  Anesthesia Plan: General   Post-op Pain Management:    Induction: Intravenous  PONV Risk Score and Plan: 2 and Treatment may vary due to age or medical condition  Airway Management Planned: Natural Airway, Nasal Cannula and Simple Face Mask  Additional Equipment: None  Intra-op Plan:   Post-operative Plan:   Informed Consent: I have reviewed the patients History and Physical, chart, labs and discussed the procedure including the risks, benefits and alternatives for the proposed anesthesia with the patient or authorized representative who has indicated his/her understanding and acceptance.     Dental Advisory Given  Plan Discussed with: CRNA  Anesthesia Plan Comments: (  )        Anesthesia Quick Evaluation

## 2023-08-20 NOTE — CV Procedure (Signed)
 Procedure:   DCCV  Indication:  Symptomatic atrial fibrillation  Procedure Note:  The patient signed informed consent.  They have had had therapeutic anticoagulation with Xarelto greater than 3 weeks.  Anesthesia was administered by Dr. Maple Hudson.  Adequate airway was maintained throughout and vital followed per protocol.  They were cardioverted x 1 with 200J of biphasic synchronized energy.  They converted to NSR with rate 60s.  There were no apparent complications.  The patient had normal neuro status and respiratory status post procedure with vitals stable as recorded elsewhere.    Follow up:  They will continue on current medical therapy and follow up with cardiology as scheduled.  Epifanio Lesches, MD 08/20/2023 7:46 AM

## 2023-08-20 NOTE — Transfer of Care (Signed)
 Immediate Anesthesia Transfer of Care Note  Patient: Lance [name]  Procedure(s) Performed: CARDIOVERSION  Patient Location: PACU  Anesthesia Type:MAC  Level of Consciousness: awake, alert , and oriented  Airway & Oxygen Therapy: Patient Spontanous Breathing and Patient connected to nasal cannula oxygen  Post-op Assessment: Report given to RN and Post -op Vital signs reviewed and stable  Post vital signs: Reviewed and stable  Last Vitals:  Vitals Value Taken Time  BP 128/81 08/20/23 0749  Temp 98.7   Pulse 74 08/20/23 0740  Resp 18 08/20/23 0740  SpO2 99 % 08/20/23 0740  Vitals shown include unfiled device data.  Last Pain: There were no vitals filed for this visit.       Complications: No notable events documented.

## 2023-08-23 ENCOUNTER — Encounter (HOSPITAL_COMMUNITY): Payer: Self-pay | Admitting: Cardiology

## 2023-08-25 NOTE — Anesthesia Postprocedure Evaluation (Signed)
 Anesthesia Post Note  Patient: Lance [name]  Procedure(s) Performed: CARDIOVERSION     Patient location during evaluation: Cath Lab Anesthesia Type: MAC and General Level of consciousness: awake and alert Pain management: pain level controlled Vital Signs Assessment: post-procedure vital signs reviewed and stable Respiratory status: spontaneous breathing, nonlabored ventilation and respiratory function stable Cardiovascular status: blood pressure returned to baseline and stable Postop Assessment: no apparent nausea or vomiting Anesthetic complications: no   There were no known notable events for this encounter.  Last Vitals:  Vitals:   08/20/23 0820 08/20/23 0826  BP: 118/68 125/73  Pulse: 74 75  Resp: 10 13  Temp:  (!) 36.2 C  SpO2: 97% 98%    Last Pain: There were no vitals filed for this visit.               [name]

## 2023-09-17 ENCOUNTER — Other Ambulatory Visit: Payer: Self-pay | Admitting: Physician Assistant

## 2023-09-27 ENCOUNTER — Ambulatory Visit (HOSPITAL_COMMUNITY): Payer: No Typology Code available for payment source | Attending: Cardiovascular Disease

## 2023-09-27 DIAGNOSIS — I4819 Other persistent atrial fibrillation: Secondary | ICD-10-CM | POA: Diagnosis not present

## 2023-09-27 LAB — ECHOCARDIOGRAM COMPLETE
Area-P 1/2: 3.23 cm2
S' Lateral: 4 cm

## 2023-09-27 MED ORDER — PERFLUTREN LIPID MICROSPHERE
1.0000 mL | INTRAVENOUS | Status: AC | PRN
  Administered 2023-09-27: 1 mL via INTRAVENOUS

## 2023-10-01 ENCOUNTER — Encounter: Payer: Self-pay | Admitting: Cardiology

## 2023-11-05 ENCOUNTER — Ambulatory Visit: Payer: No Typology Code available for payment source | Admitting: Cardiology

## 2023-11-19 ENCOUNTER — Other Ambulatory Visit: Payer: Self-pay | Admitting: Physician Assistant

## 2023-11-19 DIAGNOSIS — I4819 Other persistent atrial fibrillation: Secondary | ICD-10-CM

## 2023-11-22 NOTE — Telephone Encounter (Signed)
 Prescription refill request for Xarelto  received.  Indication: Afib  Last office visit: 08/06/23 Lance [name])  Weight: 149.7kg Age: 43 Scr: 0.99 (08/06/23)  CrCl: 201.22ml/min  Appropriate dose. Refill sent.

## 2023-11-22 NOTE — Progress Notes (Signed)
 Electrophysiology Office Note:   Date:  11/24/2023  ID:  Lance [name], DOB May 22, 1981, MRN [id number]  Primary Cardiologist: None Electrophysiologist: Fonda Kitty, MD      History of Present Illness:   Lance [name] is a 43 y.o. male with h/o persistent atrial fibrillation s/p ablation 2015 and 2020 by Dr. Kelsie, atrial flutter s/p CTI ablation, OSA, tachycardia mediated CM, PVD and obesity who is being seen today for evaluation of his atrial fibrillation.  Discussed the use of AI scribe software for clinical note transcription with the patient, who gave verbal consent to proceed.  History of Present Illness Patient started on amiodarone  and underwent DCCV on 08/20/23.  He has been maintaining sinus rhythm. He feels stronger, exercises regularly, and eats healthier. An echocardiogram in sinus rhythm showed no significant changes compared to previous results, although this was done only 1 month after cardioversion.  Amiodarone  has thus far been the only antiarrhythmic option meds maintain normal rhythm.  He is hesitant to undergo another catheter ablation.  He is taking Xarelto  without any issue.  He denies chest pain, shortness of breath, lower extremity edema.   Review of systems complete and found to be negative unless listed in HPI.   EP Information / Studies Reviewed:    EKG is not ordered today. EKG from 07/06/23 reviewed which showed atrial fibrillation with RVR, 103bpm.  EKG Interpretation Date/Time:  Tuesday November 23 2023 11:44:35 EDT Ventricular Rate:  65 PR Interval:  186 QRS Duration:  104 QT Interval:  420 QTC Calculation: 436 R Axis:   -44  Text Interpretation: Sinus rhythm with Premature atrial complexes Left axis deviation When compared with ECG of 20-Aug-2023 07:49, Premature atrial complexes are now Present Confirmed by Kitty Fonda [phone]) on 11/23/2023 12:26:34 PM   Echo 09/27/23:   1. Left ventricular ejection fraction, by estimation, is 40 to 45%. The  left  ventricle has mildly decreased function. The left ventricle  demonstrates global hypokinesis. Left ventricular diastolic parameters  were normal.   2. Right ventricular systolic function is normal. The right ventricular  size is normal. There is normal pulmonary artery systolic pressure. The  estimated right ventricular systolic pressure is 16.0 mmHg.   3. The mitral valve is grossly normal. Trivial mitral valve  regurgitation. No evidence of mitral stenosis.   4. The aortic valve is tricuspid. Aortic valve regurgitation is not  visualized. No aortic stenosis is present.   5. The inferior vena cava is normal in size with greater than 50%  respiratory variability, suggesting right atrial pressure of 3 mmHg.    Echo 04/16/23:  1. Global hypokinesis abnormal septal motion No mural apical thrombus.  Left ventricular ejection fraction, by estimation, is 40 to 45%. The left  ventricle has mildly decreased function. The left ventricle demonstrates  global hypokinesis. The left  ventricular internal cavity size was mildly dilated. There is mild left  ventricular hypertrophy. Left ventricular diastolic parameters are  indeterminate.   2. Right ventricular systolic function is normal. The right ventricular  size is normal.   3. Left atrial size was mildly dilated.   4. The mitral valve is abnormal. Trivial mitral valve regurgitation. No  evidence of mitral stenosis.   5. The aortic valve is tricuspid. Aortic valve regurgitation is not  visualized. No aortic stenosis is present.   6. Aortic dilatation noted. There is mild dilatation of the aortic root,  measuring 38 mm.   7. The inferior vena cava is normal in size  with greater than 50%  respiratory variability, suggesting right atrial pressure of 3 mmHg.   Zio 04/06/23: Patch Wear Time:  2 days and 22 hours   Atrial Fibrillation occurred continuously (100% burden) Heart rate ranging from 70-172 bpm (avg of 111 bpm).  Less than 1%  ventricular ectopy No patient triggered episodes recorded  Risk Assessment/Calculations:    CHA2DS2-VASc Score = 2   This indicates a 2.2% annual risk of stroke. The patient's score is based upon: CHF History: 1 HTN History: 1 Diabetes History: 0 Stroke History: 0 Vascular Disease History: 0 Age Score: 0 Gender Score: 0          Physical Exam:   VS:  BP (!) 166/74   Pulse 65   Ht 6' 2 (1.88 m)   SpO2 95%   BMI 42.37 kg/m    Wt Readings from Last 3 Encounters:  08/20/23 (!) 330 lb (149.7 kg)  08/06/23 (!) 336 lb (152.4 kg)  07/06/23 (!) 331 lb 12.8 oz (150.5 kg)     GEN: Well nourished, well developed in no acute distress NECK: No JVD CARDIAC: Normal rate, irregular rhythm. RESPIRATORY:  Clear to auscultation without rales, wheezing or rhonchi  ABDOMEN: Soft, non-distended EXTREMITIES:  No edema; No deformity   ASSESSMENT AND PLAN:   Lance [name] is a 43 y.o. male with h/o persistent atrial fibrillation s/p ablation 2015 and 2020 by Dr. Kelsie, atrial flutter s/p CTI ablation, OSA, tachycardia mediated CM, PVD and obesity who is being seen today for evaluation of his atrial fibrillation.  #. Persistent atrial fibrillation: Associated with tachy/arrhythmia induced cardiomyopathy. He has had 2 prior ablations, 2015 and 2020 by Dr. Kelsie. PVI only. Posterior wall has not been isolated. #. Atrial flutter s/p CTI ablation:  #. Secondary hypercoagulable state due to atrial fibrillation: CHADSVASC score of 2.  - Given development of tachy/arrhythmia induced cardiomyopathy we discussed importance of maintenance of sinus rhythm at previous visit.  His amiodarone  was increased and he underwent cardioversion.  He has been maintaining sinus rhythm since.  This correlates with symptomatic improvement.  Repeat echocardiogram in sinus 1 month after cardioversion did not show any significant change.  We discussed long-term side effects/toxicities of amiodarone  and that redo catheter  ablation may be a better option for him. I feel that catheter ablation may give him the best long-term chance at maintaining sinus rhythm. Would reassess pulmonary veins, as well as isolation of posterior wall and SVC, and looking for non-PV triggers. Patient is very hesitant to undergo cardiac catheter ablation.  We will continue amiodarone  for now with close monitoring of his vision, thyroid , lungs, liver.  -Continue digoxin  - Continue metoprolol .  - Continue Xarelto .  #. Chronic systolic heart failure: Tachy/arrhythmia induced cardiomyopathy. EF had recovered in absence of AF now decreased to 40-45% on last echo.  Was unchanged on repeat echo 1 month after cardioversion. - Well compensated currently with no obvious HF symptoms.  - Given tachy/arrhythmia induced component, we are prioritizing rhythm control over rate control as discussed above. - Continue GDMT-losartan  25 mg once daily, metoprolol  XL 100 mg twice daily, digoxin .  #. High risk medication monitoring: We discussed the potential toxicities of long term amiodarone  use including thyroid , lung, and liver side effects.  TSH normal at 2.91 on 04/05/23. LFTs normal 04/05/23. -Patient is receiving annual eye exams. -Refer for PFTs. -Recheck LFTs and TFTs.  #. Hypertension -At goal today.  Recommend checking blood pressures 1-2 times per week at home and  recording the values.  Recommend bringing these recordings to the primary care physician.  Follow up with Dr. Kennyth in 6 months  Signed, Fonda Kennyth, MD

## 2023-11-23 ENCOUNTER — Encounter: Payer: Self-pay | Admitting: Cardiology

## 2023-11-23 ENCOUNTER — Ambulatory Visit: Attending: Cardiology | Admitting: Cardiology

## 2023-11-23 ENCOUNTER — Other Ambulatory Visit: Payer: Self-pay

## 2023-11-23 VITALS — BP 166/74 | HR 65 | Ht 74.0 in

## 2023-11-23 DIAGNOSIS — I483 Typical atrial flutter: Secondary | ICD-10-CM

## 2023-11-23 DIAGNOSIS — I4819 Other persistent atrial fibrillation: Secondary | ICD-10-CM

## 2023-11-23 DIAGNOSIS — I5022 Chronic systolic (congestive) heart failure: Secondary | ICD-10-CM

## 2023-11-23 DIAGNOSIS — E66813 Obesity, class 3: Secondary | ICD-10-CM

## 2023-11-23 DIAGNOSIS — I10 Essential (primary) hypertension: Secondary | ICD-10-CM

## 2023-11-23 DIAGNOSIS — Z79899 Other long term (current) drug therapy: Secondary | ICD-10-CM

## 2023-11-23 DIAGNOSIS — D6869 Other thrombophilia: Secondary | ICD-10-CM

## 2023-11-23 MED ORDER — RIVAROXABAN 20 MG PO TABS: 20.0000 mg | ORAL_TABLET | Freq: Every day | ORAL | 3 refills | Status: AC

## 2023-11-23 NOTE — Patient Instructions (Signed)
 Medication Instructions:  Your physician recommends that you continue on your current medications as directed. Please refer to the Current Medication list given to you today.  *If you need a refill on your cardiac medications before your next appointment, please call your pharmacy*  Lab Work: TODAY: TSH, LFTs -  please stop by [facility] on the 1st floor  Testing/Procedures: Pulmonary Function Test Your physician has recommended that you have a pulmonary function test. Pulmonary Function Tests are a group of tests that measure how well air moves in and out of your lungs.  Follow-Up: At [facility], you and your health needs are our priority.  As part of our continuing mission to provide you with exceptional heart care, our providers are all part of one team.  This team includes your primary Cardiologist (physician) and Advanced Practice Providers or APPs (Physician Assistants and Nurse Practitioners) who all work together to provide you with the care you need, when you need it.  Your next appointment:   6 months  Provider:   You will see one of the following Advanced Practice Providers on your designated Care Team:   Mertha Abrahams, PA-C Michael Andy Tillery, PA-C Suzann Riddle, NP Creighton Doffing, NP

## 2023-11-24 ENCOUNTER — Ambulatory Visit: Payer: Self-pay

## 2023-11-24 DIAGNOSIS — I483 Typical atrial flutter: Secondary | ICD-10-CM

## 2023-11-24 DIAGNOSIS — I4819 Other persistent atrial fibrillation: Secondary | ICD-10-CM

## 2023-11-24 DIAGNOSIS — D6869 Other thrombophilia: Secondary | ICD-10-CM

## 2023-11-24 LAB — HEPATIC FUNCTION PANEL
ALT: 41 IU/L (ref 0–44)
AST: 33 IU/L (ref 0–40)
Albumin: 4.7 g/dL (ref 4.1–5.1)
Alkaline Phosphatase: 46 IU/L (ref 44–121)
Bilirubin Total: 0.4 mg/dL (ref 0.0–1.2)
Bilirubin, Direct: 0.16 mg/dL (ref 0.00–0.40)
Total Protein: 7.5 g/dL (ref 6.0–8.5)

## 2023-11-24 LAB — TSH: TSH: 2.54 u[IU]/mL (ref 0.450–4.500)

## 2023-11-30 ENCOUNTER — Other Ambulatory Visit: Payer: Self-pay

## 2023-11-30 DIAGNOSIS — D6869 Other thrombophilia: Secondary | ICD-10-CM

## 2023-11-30 DIAGNOSIS — I483 Typical atrial flutter: Secondary | ICD-10-CM

## 2023-11-30 DIAGNOSIS — I4819 Other persistent atrial fibrillation: Secondary | ICD-10-CM

## 2024-02-17 ENCOUNTER — Other Ambulatory Visit: Payer: Self-pay | Admitting: Cardiology

## 2024-04-06 ENCOUNTER — Other Ambulatory Visit (HOSPITAL_COMMUNITY): Payer: Self-pay

## 2024-04-06 ENCOUNTER — Telehealth: Payer: Self-pay

## 2024-04-06 ENCOUNTER — Ambulatory Visit: Admitting: Internal Medicine

## 2024-04-06 VITALS — BP 124/76 | HR 67 | Temp 97.9°F | Ht 74.0 in | Wt 322.2 lb

## 2024-04-06 DIAGNOSIS — Z566 Other physical and mental strain related to work: Secondary | ICD-10-CM | POA: Diagnosis not present

## 2024-04-06 DIAGNOSIS — Z0000 Encounter for general adult medical examination without abnormal findings: Secondary | ICD-10-CM | POA: Diagnosis not present

## 2024-04-06 DIAGNOSIS — E559 Vitamin D deficiency, unspecified: Secondary | ICD-10-CM

## 2024-04-06 DIAGNOSIS — Z125 Encounter for screening for malignant neoplasm of prostate: Secondary | ICD-10-CM | POA: Diagnosis not present

## 2024-04-06 DIAGNOSIS — K921 Melena: Secondary | ICD-10-CM

## 2024-04-06 DIAGNOSIS — R21 Rash and other nonspecific skin eruption: Secondary | ICD-10-CM

## 2024-04-06 DIAGNOSIS — E538 Deficiency of other specified B group vitamins: Secondary | ICD-10-CM

## 2024-04-06 LAB — CBC WITH DIFFERENTIAL/PLATELET
Basophils Absolute: 0 K/uL (ref 0.0–0.1)
Basophils Relative: 0.6 % (ref 0.0–3.0)
Eosinophils Absolute: 0.1 K/uL (ref 0.0–0.7)
Eosinophils Relative: 1.7 % (ref 0.0–5.0)
HCT: 43.5 % (ref 39.0–52.0)
Hemoglobin: 14.8 g/dL (ref 13.0–17.0)
Lymphocytes Relative: 29.8 % (ref 12.0–46.0)
Lymphs Abs: 1.9 K/uL (ref 0.7–4.0)
MCHC: 34 g/dL (ref 30.0–36.0)
MCV: 91.8 fl (ref 78.0–100.0)
Monocytes Absolute: 0.6 K/uL (ref 0.1–1.0)
Monocytes Relative: 8.8 % (ref 3.0–12.0)
Neutro Abs: 3.7 K/uL (ref 1.4–7.7)
Neutrophils Relative %: 59.1 % (ref 43.0–77.0)
Platelets: 216 K/uL (ref 150.0–400.0)
RBC: 4.74 Mil/uL (ref 4.22–5.81)
RDW: 13.5 % (ref 11.5–15.5)
WBC: 6.2 K/uL (ref 4.0–10.5)

## 2024-04-06 LAB — LIPID PANEL
Cholesterol: 135 mg/dL (ref 0–200)
HDL: 40.9 mg/dL (ref >39.00)
LDL Cholesterol: 79 mg/dL (ref 0–99)
NonHDL: 94.31
Total CHOL/HDL Ratio: 3
Triglycerides: 76 mg/dL (ref 0.0–149.0)
VLDL: 15.2 mg/dL (ref 0.0–40.0)

## 2024-04-06 LAB — COMPREHENSIVE METABOLIC PANEL WITH GFR
ALT: 39 U/L (ref 0–53)
AST: 31 U/L (ref 0–37)
Albumin: 4.7 g/dL (ref 3.5–5.2)
Alkaline Phosphatase: 45 U/L (ref 39–117)
BUN: 15 mg/dL (ref 6–23)
CO2: 29 meq/L (ref 19–32)
Calcium: 10 mg/dL (ref 8.4–10.5)
Chloride: 101 meq/L (ref 96–112)
Creatinine, Ser: 1.03 mg/dL (ref 0.40–1.50)
GFR: 88.1 mL/min (ref >60.00)
Glucose, Bld: 84 mg/dL (ref 70–99)
Potassium: 4.3 meq/L (ref 3.5–5.1)
Sodium: 138 meq/L (ref 135–145)
Total Bilirubin: 0.6 mg/dL (ref 0.2–1.2)
Total Protein: 7.5 g/dL (ref 6.0–8.3)

## 2024-04-06 MED ORDER — CLOTRIMAZOLE-BETAMETHASONE 1-0.05 % EX CREA: TOPICAL_CREAM | Freq: Two times a day (BID) | CUTANEOUS | 1 refills | Status: AC

## 2024-04-06 MED ORDER — HYDROCORTISONE ACETATE 25 MG RE SUPP: 25.0000 mg | Freq: Two times a day (BID) | RECTAL | 0 refills | Status: AC | PRN

## 2024-04-06 NOTE — Assessment & Plan Note (Signed)
 Much better

## 2024-04-06 NOTE — Telephone Encounter (Signed)
 Good afternoon ya'll!  Patient post visit had informed us  that his prescription for the Hydrocortisone Acetate 25 mg is going to require a prior authorization. Is there anyway you can assist with this?

## 2024-04-06 NOTE — Patient Instructions (Signed)
 Zepbound  [employer]

## 2024-04-06 NOTE — Assessment & Plan Note (Addendum)
 We discussed age appropriate health related issues, including available/recomended screening tests and vaccinations. Labs were ordered to be later reviewed . All questions were answered. We discussed one or more of the following - seat belt use, use of sunscreen/sun exposure exercise, safe sex, fall risk reduction, second hand smoke exposure, firearm use and storage, seat belt use, a need for adhering to healthy diet and exercise. Labs were ordered.  All questions were answered. Take Vit B12 and Vit D Loose wt Colon was suggested in 2 months at 43 yo

## 2024-04-06 NOTE — Assessment & Plan Note (Signed)
 L axila Lotrisone prn

## 2024-04-06 NOTE — Assessment & Plan Note (Signed)
 Anusol HC Colonoscopy was advised Fiber in diet

## 2024-04-06 NOTE — Progress Notes (Signed)
 Edward okay okay good  Subjective:  Patient ID: Lance [name], male    DOB: Apr 04, 1981  Age: 43 y.o. MRN: [id number]  CC: Annual Exam (Annual Exam)   HPI Lance [name] presents for a well exam C/o bleeding hemorrhoid (?) x months C/o leg swelling   Outpatient Medications Prior to Visit  Medication Sig Dispense Refill   acetaminophen  (TYLENOL ) 500 MG tablet Take 1,000 mg by mouth every 6 (six) hours as needed for headache.     amiodarone  (PACERONE ) 200 MG tablet Take 2 tablets (400 mg total) by mouth daily for 30 days, THEN 1 tablet (200 mg total) daily. (Patient taking differently: Take 1 tablet (200 mg) by mouth twice daily.) 100 tablet 3   B Complex-Folic Acid (B COMPLEX PLUS) TABS Take 1 tablet by mouth daily. (Patient taking differently: Take 1 tablet by mouth every evening.) 100 tablet 3   busPIRone  (BUSPAR ) 10 MG tablet Take 1 tablet (10 mg total) by mouth 2 (two) times daily. ONE TABLET BY MOUTH TWICE A DAY (Patient taking differently: Take 10 mg by mouth every evening.) 180 tablet 1   Cholecalciferol (VITAMIN D3) 50 MCG (2000 UT) capsule Take 2 capsules (4,000 Units total) by mouth daily. (Patient taking differently: Take 4,000 Units by mouth every evening.) 100 capsule 3   digoxin  (LANOXIN ) 0.125 MG tablet Take 0.5 tablets (0.0625 mg total) by mouth daily. 45 tablet 3   fexofenadine (ALLEGRA) 180 MG tablet Take 180 mg by mouth at bedtime as needed (Allergies).     furosemide  (LASIX ) 40 MG tablet Take 1 tablet (40 mg total) by mouth daily as needed for edema. 90 tablet 1   losartan  (COZAAR ) 25 MG tablet TAKE ONE TABLET BY MOUTH ONCE A DAY 90 tablet 2   metoprolol  succinate (TOPROL -XL) 100 MG 24 hr tablet Take one tablet (100 mg total) by mouth 2 (two) times daily with or immediately following a meal. 180 tablet 3   naphazoline-pheniramine (VISINE) 0.025-0.3 % ophthalmic solution Place 1 drop into both eyes daily as needed for eye irritation.     rivaroxaban  (XARELTO ) 20 MG TABS  tablet Take 1 tablet (20 mg total) by mouth daily with supper. 90 tablet 3   OVER THE COUNTER MEDICATION Take 1 Scoop by mouth 3 (three) times a week. Juice power     No facility-administered medications prior to visit.    ROS: Review of Systems  Constitutional:  Negative for appetite change, fatigue and unexpected weight change.  HENT:  Negative for congestion, nosebleeds, sneezing, sore throat and trouble swallowing.   Eyes:  Negative for itching and visual disturbance.  Respiratory:  Negative for cough.   Cardiovascular:  Positive for palpitations and leg swelling. Negative for chest pain.  Gastrointestinal:  Negative for abdominal distention, blood in stool, diarrhea and nausea.  Genitourinary:  Negative for frequency and hematuria.  Musculoskeletal:  Negative for back pain, gait problem, joint swelling and neck pain.  Skin:  Positive for color change. Negative for rash.  Neurological:  Negative for dizziness, tremors, speech difficulty and weakness.  Psychiatric/Behavioral:  Negative for agitation, dysphoric mood, sleep disturbance and suicidal ideas. The patient is not nervous/anxious.     Objective:  BP 124/76   Pulse 67   Temp 97.9 F (36.6 C)   Ht 6' 2 (1.88 m)   Wt (!) 322 lb 3.2 oz (146.1 kg)   SpO2 99%   BMI 41.37 kg/m   BP Readings from Last 3 Encounters:  04/06/24 124/76  11/23/23 (!) 166/74  09/27/23 131/89    Wt Readings from Last 3 Encounters:  04/06/24 (!) 322 lb 3.2 oz (146.1 kg)  08/20/23 (!) 330 lb (149.7 kg)  08/06/23 (!) 336 lb (152.4 kg)    Physical Exam Constitutional:      General: He is not in acute distress.    Appearance: He is well-developed. He is obese.     Comments: NAD  Eyes:     Conjunctiva/sclera: Conjunctivae normal.     Pupils: Pupils are equal, round, and reactive to light.  Neck:     Thyroid : No thyromegaly.     Vascular: No JVD.  Cardiovascular:     Rate and Rhythm: Normal rate. Rhythm irregular.     Heart sounds:  Normal heart sounds. No murmur heard.    No friction rub. No gallop.  Pulmonary:     Effort: Pulmonary effort is normal. No respiratory distress.     Breath sounds: Normal breath sounds. No wheezing or rales.  Chest:     Chest wall: No tenderness.  Abdominal:     General: Bowel sounds are normal. There is no distension.     Palpations: Abdomen is soft. There is no mass.     Tenderness: There is no abdominal tenderness. There is no guarding or rebound.  Musculoskeletal:        General: No tenderness. Normal range of motion.     Cervical back: Normal range of motion.     Right lower leg: Edema present.     Left lower leg: Edema present.  Lymphadenopathy:     Cervical: No cervical adenopathy.  Skin:    General: Skin is warm and dry.     Findings: No rash.  Neurological:     Mental Status: He is alert and oriented to person, place, and time.     Cranial Nerves: No cranial nerve deficit.     Motor: No abnormal muscle tone.     Coordination: Coordination normal.     Gait: Gait normal.     Deep Tendon Reflexes: Reflexes are normal and symmetric.  Psychiatric:        Behavior: Behavior normal.        Thought Content: Thought content normal.        Judgment: Judgment normal.    Rect per GI Trace edema and mild erythema on B distal shins   Lab Results  Component Value Date   WBC 6.2 04/06/2024   HGB 14.8 04/06/2024   HCT 43.5 04/06/2024   PLT 216.0 04/06/2024   GLUCOSE 84 04/06/2024   CHOL 135 04/06/2024   TRIG 76.0 04/06/2024   HDL 40.90 04/06/2024   LDLCALC 79 04/06/2024   ALT 39 04/06/2024   AST 31 04/06/2024   NA 138 04/06/2024   K 4.3 04/06/2024   CL 101 04/06/2024   CREATININE 1.03 04/06/2024   BUN 15 04/06/2024   CO2 29 04/06/2024   TSH 2.91 04/06/2024   PSA 0.35 04/06/2024   INR 1.07 01/01/2016    EP STUDY Result Date: 08/23/2023 See surgical note for result.   Assessment & Plan:   Problem List Items Addressed This Visit     Blood in stool   Anusol   HC Colonoscopy was advised Fiber in diet      Low serum vitamin B12   On B12 Check B12      Relevant Orders   Vitamin B12 (Completed)   Rash   L axila Lotrisone  prn  Stress at work   Much better      Vitamin D  deficiency   Relevant Orders   VITAMIN D  25 Hydroxy (Vit-D Deficiency, Fractures) (Completed)   Well adult exam - Primary   We discussed age appropriate health related issues, including available/recomended screening tests and vaccinations. Labs were ordered to be later reviewed . All questions were answered. We discussed one or more of the following - seat belt use, use of sunscreen/sun exposure exercise, safe sex, fall risk reduction, second hand smoke exposure, firearm use and storage, seat belt use, a need for adhering to healthy diet and exercise. Labs were ordered.  All questions were answered. Take Vit B12 and Vit D Loose wt Colon was suggested in 2 months at 43 yo      Relevant Orders   TSH (Completed)   Urinalysis (Completed)   CBC with Differential/Platelet (Completed)   Lipid panel (Completed)   PSA (Completed)   Comprehensive metabolic panel with GFR (Completed)      Meds ordered this encounter  Medications   hydrocortisone  (ANUSOL -HC) 25 MG suppository    Sig: Place 1 suppository (25 mg total) rectally 2 (two) times daily as needed for hemorrhoids.    Dispense:  20 suppository    Refill:  0    Pls substitute for a covered alternative if needed   clotrimazole -betamethasone  (LOTRISONE ) cream    Sig: Apply topically 2 (two) times daily.    Dispense:  90 g    Refill:  1      Follow-up: Return in about 6 months (around 10/05/2024) for a follow-up visit.  Marolyn Noel, MD

## 2024-04-06 NOTE — Assessment & Plan Note (Addendum)
On B12 Check B12 

## 2024-04-07 ENCOUNTER — Other Ambulatory Visit (HOSPITAL_COMMUNITY): Payer: Self-pay

## 2024-04-07 ENCOUNTER — Telehealth: Payer: Self-pay

## 2024-04-07 LAB — URINALYSIS
Bilirubin Urine: NEGATIVE
Hgb urine dipstick: NEGATIVE
Ketones, ur: NEGATIVE
Leukocytes,Ua: NEGATIVE
Nitrite: NEGATIVE
Specific Gravity, Urine: 1.025 (ref 1.000–1.030)
Total Protein, Urine: NEGATIVE
Urine Glucose: NEGATIVE
Urobilinogen, UA: 0.2 (ref 0.0–1.0)
pH: 5.5 (ref 5.0–8.0)

## 2024-04-07 LAB — PSA: PSA: 0.35 ng/mL (ref 0.10–4.00)

## 2024-04-07 LAB — TSH: TSH: 2.91 u[IU]/mL (ref 0.35–5.50)

## 2024-04-07 LAB — VITAMIN B12: Vitamin B-12: 835 pg/mL (ref 211–911)

## 2024-04-07 LAB — VITAMIN D 25 HYDROXY (VIT D DEFICIENCY, FRACTURES): VITD: 41.55 ng/mL (ref 30.00–100.00)

## 2024-04-07 NOTE — Telephone Encounter (Signed)
 Clinical questions answered and PA submitted.

## 2024-04-07 NOTE — Telephone Encounter (Signed)
 Pharmacy Patient Advocate Encounter   Received notification from Pt Calls Messages that prior authorization for Hydrocortisone Acetate 25mg  suppositories is required/requested.   Insurance verification completed.   The patient is insured through [facility].   Per test claim: PA required; PA started via CoverMyMeds. KEY [id number] . Waiting for clinical questions to populate.

## 2024-04-08 ENCOUNTER — Ambulatory Visit: Payer: Self-pay | Admitting: Internal Medicine

## 2024-04-10 NOTE — Telephone Encounter (Signed)
 Pharmacy Patient Advocate Encounter  Received notification from [employer] that Prior Authorization for Hydrocortisone Acetate 25mg  suppositories has been DENIED.  See denial reason below. No denial letter attached in CMM. Will attach denial letter to Media tab once received.   PA #/Case ID/Reference #: [id number]

## 2024-06-27 ENCOUNTER — Other Ambulatory Visit: Payer: Self-pay | Admitting: Physician Assistant

## 2024-07-11 ENCOUNTER — Other Ambulatory Visit: Payer: Self-pay | Admitting: Internal Medicine
